# Patient Record
Sex: Male | Born: 1940 | Race: Black or African American | Hispanic: No | Marital: Married | Smoking: Never smoker
Health system: Southern US, Community
[De-identification: ages and names within clinical notes are randomized; demographics above are authoritative.]

## PROBLEM LIST (undated history)

## (undated) DIAGNOSIS — I2699 Other pulmonary embolism without acute cor pulmonale: Secondary | ICD-10-CM

## (undated) DIAGNOSIS — I82409 Acute embolism and thrombosis of unspecified deep veins of unspecified lower extremity: Secondary | ICD-10-CM

## (undated) DIAGNOSIS — G459 Transient cerebral ischemic attack, unspecified: Secondary | ICD-10-CM

## (undated) DIAGNOSIS — K219 Gastro-esophageal reflux disease without esophagitis: Secondary | ICD-10-CM

## (undated) DIAGNOSIS — M1990 Unspecified osteoarthritis, unspecified site: Secondary | ICD-10-CM

## (undated) DIAGNOSIS — R413 Other amnesia: Secondary | ICD-10-CM

## (undated) DIAGNOSIS — C801 Malignant (primary) neoplasm, unspecified: Secondary | ICD-10-CM

## (undated) HISTORY — DX: Acute embolism and thrombosis of unspecified deep veins of unspecified lower extremity: I82.409

## (undated) HISTORY — DX: Other amnesia: R41.3

## (undated) HISTORY — DX: Other pulmonary embolism without acute cor pulmonale: I26.99

---

## 2009-07-09 ENCOUNTER — Inpatient Hospital Stay: Payer: Self-pay | Admitting: Internal Medicine

## 2010-08-03 HISTORY — PX: PROSTATE SURGERY: SHX751

## 2011-05-04 DIAGNOSIS — C801 Malignant (primary) neoplasm, unspecified: Secondary | ICD-10-CM

## 2011-05-04 HISTORY — DX: Malignant (primary) neoplasm, unspecified: C80.1

## 2011-09-23 ENCOUNTER — Emergency Department: Payer: Self-pay | Admitting: Unknown Physician Specialty

## 2011-09-23 LAB — URINALYSIS, COMPLETE
Bacteria: NONE SEEN
Bilirubin,UR: NEGATIVE
Glucose,UR: NEGATIVE mg/dL (ref 0–75)
Ketone: NEGATIVE
Leukocyte Esterase: NEGATIVE
Nitrite: NEGATIVE
Protein: NEGATIVE
RBC,UR: 1 /HPF (ref 0–5)
Specific Gravity: 1.003 (ref 1.003–1.030)
Squamous Epithelial: NONE SEEN
WBC UR: NONE SEEN /HPF (ref 0–5)

## 2012-07-20 ENCOUNTER — Other Ambulatory Visit (HOSPITAL_COMMUNITY): Payer: Self-pay | Admitting: Urology

## 2012-07-20 DIAGNOSIS — C61 Malignant neoplasm of prostate: Secondary | ICD-10-CM

## 2012-08-04 ENCOUNTER — Encounter (HOSPITAL_COMMUNITY)
Admission: RE | Admit: 2012-08-04 | Discharge: 2012-08-04 | Disposition: A | Discharge status: Home / Self Care | Payer: Medicare Other | Source: Ambulatory Visit | Attending: Urology | Admitting: Urology

## 2012-08-04 DIAGNOSIS — C61 Malignant neoplasm of prostate: Secondary | ICD-10-CM | POA: Insufficient documentation

## 2012-08-04 MED ORDER — TECHNETIUM TC 99M MEDRONATE IV KIT
25.0000 | PACK | Freq: Once | INTRAVENOUS | Status: AC | PRN
  Administered 2012-08-04: 25 via INTRAVENOUS

## 2012-08-15 ENCOUNTER — Encounter (HOSPITAL_COMMUNITY): Payer: Self-pay | Admitting: Pharmacy Technician

## 2012-08-15 ENCOUNTER — Other Ambulatory Visit: Payer: Self-pay | Admitting: Urology

## 2012-08-17 NOTE — Patient Instructions (Signed)
Philip Moreno  08/17/2012   Your procedure is scheduled on:  08/25/12   Report to [facility] at 0515 AM.  Call this number if you have problems the morning of surgery: [phone]   Remember:   Do not eat food or drink liquids after midnight.   Take these medicines the morning of surgery with A SIP OF WATER:    Do not wear jewelry,   Do not wear lotions, powders, or perfumes.     Men may shave face and neck.  Do not bring valuables to the hospital.  Contacts, dentures or bridgework may not be worn into surgery.  Leave suitcase in the car. After surgery it may be brought to your room.  For patients admitted to the hospital, checkout time is 11:00 AM the day of  discharge. .  SEE CHG INSTRUCTION SHEET    Please read over the following fact sheets that you were given: MRSA Information, Incentive Spirometry Fact Sheet, Blood Transfusion fact Sheet, coughing and deep breathing exercises, leg exercises                Failure to comply with these instructions may result in cancellation of your surgery              Patient Signature ________________________________              Nurse Signature _______________________________

## 2012-08-18 ENCOUNTER — Ambulatory Visit (HOSPITAL_COMMUNITY)
Admission: RE | Admit: 2012-08-18 | Discharge: 2012-08-18 | Disposition: A | Discharge status: Home / Self Care | Payer: Medicare Other | Source: Ambulatory Visit | Attending: Urology | Admitting: Urology

## 2012-08-18 ENCOUNTER — Encounter (HOSPITAL_COMMUNITY): Payer: Self-pay

## 2012-08-18 ENCOUNTER — Encounter (HOSPITAL_COMMUNITY)
Admission: RE | Admit: 2012-08-18 | Discharge: 2012-08-18 | Disposition: A | Discharge status: Home / Self Care | Payer: Medicare Other | Source: Ambulatory Visit | Attending: Urology | Admitting: Urology

## 2012-08-18 DIAGNOSIS — Z01810 Encounter for preprocedural cardiovascular examination: Secondary | ICD-10-CM | POA: Insufficient documentation

## 2012-08-18 DIAGNOSIS — Z01812 Encounter for preprocedural laboratory examination: Secondary | ICD-10-CM | POA: Insufficient documentation

## 2012-08-18 DIAGNOSIS — C61 Malignant neoplasm of prostate: Secondary | ICD-10-CM | POA: Insufficient documentation

## 2012-08-18 DIAGNOSIS — I10 Essential (primary) hypertension: Secondary | ICD-10-CM | POA: Insufficient documentation

## 2012-08-18 HISTORY — DX: Unspecified osteoarthritis, unspecified site: M19.90

## 2012-08-18 HISTORY — DX: Gastro-esophageal reflux disease without esophagitis: K21.9

## 2012-08-18 HISTORY — DX: Malignant (primary) neoplasm, unspecified: C80.1

## 2012-08-18 LAB — BASIC METABOLIC PANEL
BUN: 13 mg/dL (ref 6–23)
CO2: 26 mEq/L (ref 19–32)
Chloride: 103 mEq/L (ref 96–112)
Creatinine, Ser: 1.23 mg/dL (ref 0.50–1.35)
GFR calc Af Amer: 67 mL/min — ABNORMAL LOW (ref >90)
Glucose, Bld: 95 mg/dL (ref 70–99)

## 2012-08-18 LAB — CBC
HCT: 45.5 % (ref 39.0–52.0)
MCH: 27.2 pg (ref 26.0–34.0)
MCHC: 33.4 g/dL (ref 30.0–36.0)
MCV: 81.4 fL (ref 78.0–100.0)
RDW: 13.1 % (ref 11.5–15.5)

## 2012-08-24 NOTE — Anesthesia Preprocedure Evaluation (Signed)
Anesthesia Evaluation  Patient identified by MRN, date of birth, ID band Patient awake    Reviewed: Allergy & Precautions, H&P , NPO status , Patient's Chart, lab work & pertinent test results, reviewed documented beta blocker date and time   Airway Mallampati: II TM Distance: >3 FB Neck ROM: full    Dental No notable dental hx.    Pulmonary neg pulmonary ROS,  breath sounds clear to auscultation  Pulmonary exam normal       Cardiovascular Exercise Tolerance: Good hypertension, On Medications Rhythm:regular Rate:Normal     Neuro/Psych negative neurological ROS  negative psych ROS   GI/Hepatic negative GI ROS, Neg liver ROS,   Endo/Other  negative endocrine ROS  Renal/GU negative Renal ROS  negative genitourinary   Musculoskeletal   Abdominal   Peds  Hematology negative hematology ROS (+)   Anesthesia Other Findings   Reproductive/Obstetrics negative OB ROS                           Anesthesia Physical Anesthesia Plan  ASA: II  Anesthesia Plan: General ETT   Post-op Pain Management:    Induction:   Airway Management Planned:   Additional Equipment:   Intra-op Plan:   Post-operative Plan:   Informed Consent: I have reviewed the patients History and Physical, chart, labs and discussed the procedure including the risks, benefits and alternatives for the proposed anesthesia with the patient or authorized representative who has indicated his/her understanding and acceptance.   Dental Advisory Given  Plan Discussed with: CRNA  Anesthesia Plan Comments:         Anesthesia Quick Evaluation

## 2012-08-24 NOTE — H&P (Signed)
Chief Complaint  Prostate Cancer   Reason For Visit  Reason for consult: To discuss treatment options for prostate cancer and specifically to consider a robotic prostatectomy. Physician requesting consult: Dr. Jerilee Field PCP: Dr. 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 Mena Goes to perform a prostate biopsy on 07/13/12.  This demonstrated Gleason 4+3=7 adenocarcinoma with 5 out of 12 biopsy cores positive for malignancy. He underwent staging studies on 08/04/12 including a bone scan and CT scan of the abdomen and pelvis both of which were negative for obvious metastatic disease. He has a family history of prostate cancer with a brother who died of metastatic prostate cancer. Philip Moreno is overall very healthy with minimal medical comorbid conditions.  TNM stage: cT2b vs. cT3a N0 M0 PSA: 12.91 Gleason score: 4+3=7 Biopsy (07/13/12): 5 out of 12 biopsy cores positive   Left: L lateral apex (50%, 3+4=7), L apex (20%, 3+3=6), L lateral mid (10%, 3+3=6)   Right: R apex (< 5%, 3+4=7), R lateral apex (80%, 4+3=7) Prostate volume: 36 cc  Nomogram: OC disease: 47% EPE: 56% SVI: 22% LNI: 3.9% PFS (surgery): 83%, 75%  Urinary function: He has moderate LUTS including frequency, urgency, nocturia, and a weak stream. IPSS is 9. Erectile function: He denies erectile dysfunction at baseline although says he has been having difficulty since his prostate biopsy to some degree. SHIM score is 25.   Past Medical History Problems  1. History of  Hypertension 401.9  Surgical History Problems  1. History of  No Surgical Problems  Current Meds 1. Diltiazem HCl ER Beads 300 MG Oral Capsule Extended Release 24 Hour; Therapy:  (Recorded:14Nov2013) to  Allergies Medication  1. No Known Drug Allergies  Family History Problems  1. Paternal history of  Acute  Myocardial Infarction V17.3 2. Family history of  Death In The Family Father 3. Family history of  Diabetes Mellitus V18.0 4. Family history of  Family Health Status Of Mother - Alive 5. Fraternal history of  Prostate Cancer V16.42  Social History Problems    Chewing Nicotine-containing Substances Tobacco   Marital History - Currently Married   Never A Smoker   Occupation: Visual merchandiser Denied    History of  Alcohol Use   History of  Caffeine Use  Review of Systems Constitutional, skin, eye, otolaryngeal, hematologic/lymphatic, cardiovascular, pulmonary, endocrine, musculoskeletal, gastrointestinal, neurological and psychiatric system(s) were reviewed and pertinent findings if present are noted.    Vitals Vital Signs [Data Includes: Last 1 Day]  17Jan2014 11:23AM  Blood Pressure: 126 / 77 Heart Rate: 82  Physical Exam Constitutional: Well nourished and well developed . No acute distress.  ENT:. The ears and nose are normal in appearance.  Neck: The appearance of the neck is normal and no neck mass is present.  Pulmonary: No respiratory distress, normal respiratory rhythm and effort and clear bilateral breath sounds.  Cardiovascular: Heart rate and rhythm are normal . No peripheral edema.  Abdomen: The abdomen is soft and nontender. No masses are palpated. No CVA tenderness. No hernias are palpable. No hepatosplenomegaly noted.  Rectal: Rectal exam demonstrates normal sphincter tone, no tenderness and no masses. Prostate size is estimated to be 45 g. He has a large nodule extending from the right apex to the right base with some concern for possible extraprostatic extension. There are no abnormalities noted on  the left side. The prostate is not tender. The left seminal vesicle is nonpalpable. The right seminal vesicle is nonpalpable. The perineum is normal on inspection.  Lymphatics: The femoral and inguinal nodes are not enlarged or tender.  Skin: Normal skin turgor, no visible rash  and no visible skin lesions.  Neuro/Psych:. Mood and affect are appropriate.    Results/Data Urine [Data Includes: Last 1 Day]   17Jan2014  COLOR YELLOW   APPEARANCE CLEAR   SPECIFIC GRAVITY 1.020   pH 6.0   GLUCOSE NEG mg/dL  BILIRUBIN NEG   KETONE NEG mg/dL  BLOOD TRACE   PROTEIN NEG mg/dL  UROBILINOGEN 0.2 mg/dL  NITRITE NEG   LEUKOCYTE ESTERASE NEG   SQUAMOUS EPITHELIAL/HPF NONE SEEN   WBC NONE SEEN WBC/hpf  RBC 0-2 RBC/hpf  BACTERIA NONE SEEN   CRYSTALS NONE SEEN   CASTS NONE SEEN     I independently reviewed his medical records, PSA results, pathology report, CT scan and bone scan.  Findings are as dictated above.   Plan  Health Maintenance (V70.0)  1. UA With REFLEX  Done: 17Jan2014 11:11AM Prostate Cancer (185)  2. Follow-up Office  Follow-up  Requested for: 17Jan2014  Prostate Cancer 185    Discussion/Summary  1. Prostate cancer:   The patient was counseled about the natural history of prostate cancer and the standard treatment options that are available for prostate cancer. It was explained to him how his age and life expectancy, clinical stage, Gleason score, and PSA affect his prognosis, the decision to proceed with additional staging studies, as well as how that information influences recommended treatment strategies. We discussed the roles for active surveillance, radiation therapy, surgical therapy, androgen deprivation, as well as ablative therapy options for the treatment of prostate cancer as appropriate to his individual cancer situation. We discussed the risks and benefits of these options with regard to their impact on cancer control and also in terms of potential adverse events, complications, and impact on quiality of life particularly related to urinary, bowel, and sexual function. The patient was encouraged to ask questions throughout the discussion today and all questions were answered to his stated satisfaction. In addition, the patient was provided  with and/or directed to appropriate resources and literature for further education about prostate cancer and treatment options.   We discussed surgical therapy for prostate cancer including the different available surgical approaches. We discussed, in detail, the risks and expectations of surgery with regard to cancer control, urinary control, and erectile function as well as the expected postoperative recovery process. Additional risks of surgery including but not limited to bleeding, infection, hernia formation, nerve damage, lymphocele formation, bowel/rectal injury potentially necessitating colostomy, damage to the urinary tract resulting in urine leakage, urethral stricture, and the cardiopulmonary risks such as myocardial infarction, stroke, death, venothromboembolism, etc. were explained. The risk of open surgical conversion for robotic/laparoscopic prostatectomy was also discussed.   He does wish to proceed with surgical therapy. Considering his digital rectal exam and the concern for possibly locally advanced disease the right side, I recommended a left nerve sparing robotic-assisted laparoscopic radical prostatectomy and bilateral pelvic lymphadenectomy. We specifically discussed the implications of this surgical approach without relates to his postoperative erectile function. He understands that he has an approximately 50% chance of regaining erectile function postoperatively and accepts this risk.  CC: Dr. Jerilee Field Dr. Tim Lair    SignaturesElectronically signed by : Heloise Purpura, M.D.; Aug 19 2012  3:12PM

## 2012-08-25 ENCOUNTER — Encounter (HOSPITAL_COMMUNITY)
Admission: RE | Disposition: A | Discharge status: Home / Self Care | Payer: Self-pay | Source: Ambulatory Visit | Attending: Urology

## 2012-08-25 ENCOUNTER — Inpatient Hospital Stay (HOSPITAL_COMMUNITY): Payer: Medicare Other | Admitting: Anesthesiology

## 2012-08-25 ENCOUNTER — Observation Stay (HOSPITAL_COMMUNITY)
Admission: RE | Admit: 2012-08-25 | Discharge: 2012-08-26 | Disposition: A | Discharge status: Home / Self Care | Payer: Medicare Other | Source: Ambulatory Visit | Attending: Urology | Admitting: Urology

## 2012-08-25 ENCOUNTER — Encounter (HOSPITAL_COMMUNITY): Payer: Self-pay | Admitting: *Deleted

## 2012-08-25 ENCOUNTER — Encounter (HOSPITAL_COMMUNITY): Payer: Self-pay | Admitting: Anesthesiology

## 2012-08-25 DIAGNOSIS — I10 Essential (primary) hypertension: Secondary | ICD-10-CM | POA: Insufficient documentation

## 2012-08-25 DIAGNOSIS — C61 Malignant neoplasm of prostate: Principal | ICD-10-CM | POA: Insufficient documentation

## 2012-08-25 HISTORY — PX: LYMPHADENECTOMY: SHX5960

## 2012-08-25 HISTORY — PX: ROBOT ASSISTED LAPAROSCOPIC RADICAL PROSTATECTOMY: SHX5141

## 2012-08-25 LAB — HEMOGLOBIN AND HEMATOCRIT, BLOOD
HCT: 43.4 % (ref 39.0–52.0)
Hemoglobin: 14.3 g/dL (ref 13.0–17.0)

## 2012-08-25 LAB — ABO/RH: ABO/RH(D): O POS

## 2012-08-25 LAB — TYPE AND SCREEN

## 2012-08-25 SURGERY — ROBOTIC ASSISTED LAPAROSCOPIC RADICAL PROSTATECTOMY LEVEL 2
Anesthesia: General | Wound class: Clean

## 2012-08-25 MED ORDER — KCL IN DEXTROSE-NACL 20-5-0.45 MEQ/L-%-% IV SOLN
INTRAVENOUS | Status: DC
  Administered 2012-08-25 – 2012-08-26 (×3): via INTRAVENOUS
  Filled 2012-08-25 (×4): qty 1000

## 2012-08-25 MED ORDER — SODIUM CHLORIDE 0.9 % IR SOLN
Status: DC | PRN
  Administered 2012-08-25: 1000 mL

## 2012-08-25 MED ORDER — LACTATED RINGERS IV SOLN: INTRAVENOUS | Status: DC

## 2012-08-25 MED ORDER — ACETAMINOPHEN 325 MG PO TABS: 650.0000 mg | ORAL_TABLET | ORAL | Status: DC | PRN

## 2012-08-25 MED ORDER — BUPIVACAINE-EPINEPHRINE 0.25% -1:200000 IJ SOLN
INTRAMUSCULAR | Status: DC | PRN
  Administered 2012-08-25: 28 mL

## 2012-08-25 MED ORDER — CEFAZOLIN SODIUM 1-5 GM-% IV SOLN
1.0000 g | Freq: Three times a day (TID) | INTRAVENOUS | Status: AC
  Administered 2012-08-25 (×2): 1 g via INTRAVENOUS
  Filled 2012-08-25 (×2): qty 50

## 2012-08-25 MED ORDER — CIPROFLOXACIN HCL 500 MG PO TABS: 500.0000 mg | ORAL_TABLET | Freq: Two times a day (BID) | ORAL | Status: DC

## 2012-08-25 MED ORDER — ONDANSETRON HCL 4 MG/2ML IJ SOLN
INTRAMUSCULAR | Status: DC | PRN
  Administered 2012-08-25: 4 mg via INTRAVENOUS

## 2012-08-25 MED ORDER — LACTATED RINGERS IV SOLN
INTRAVENOUS | Status: DC | PRN
  Administered 2012-08-25 (×2): via INTRAVENOUS

## 2012-08-25 MED ORDER — LIDOCAINE HCL (CARDIAC) 20 MG/ML IV SOLN
INTRAVENOUS | Status: DC | PRN
  Administered 2012-08-25: 50 mg via INTRAVENOUS

## 2012-08-25 MED ORDER — INDIGOTINDISULFONATE SODIUM 8 MG/ML IJ SOLN
INTRAMUSCULAR | Status: DC | PRN
  Administered 2012-08-25 (×2): 5 mL via INTRAVENOUS

## 2012-08-25 MED ORDER — HYDROMORPHONE HCL PF 1 MG/ML IJ SOLN
0.2500 mg | INTRAMUSCULAR | Status: DC | PRN
  Administered 2012-08-25 (×2): 0.5 mg via INTRAVENOUS

## 2012-08-25 MED ORDER — HYDROMORPHONE HCL PF 1 MG/ML IJ SOLN
INTRAMUSCULAR | Status: DC | PRN
  Administered 2012-08-25 (×2): 0.5 mg via INTRAVENOUS
  Administered 2012-08-25: 1 mg via INTRAVENOUS

## 2012-08-25 MED ORDER — GLYCOPYRROLATE 0.2 MG/ML IJ SOLN
INTRAMUSCULAR | Status: DC | PRN
  Administered 2012-08-25: .7 mg via INTRAVENOUS

## 2012-08-25 MED ORDER — PROMETHAZINE HCL 25 MG/ML IJ SOLN: 6.2500 mg | INTRAMUSCULAR | Status: DC | PRN

## 2012-08-25 MED ORDER — DIPHENHYDRAMINE HCL 12.5 MG/5ML PO ELIX: 12.5000 mg | ORAL_SOLUTION | Freq: Four times a day (QID) | ORAL | Status: DC | PRN

## 2012-08-25 MED ORDER — DOCUSATE SODIUM 100 MG PO CAPS
100.0000 mg | ORAL_CAPSULE | Freq: Two times a day (BID) | ORAL | Status: DC
  Administered 2012-08-25 – 2012-08-26 (×3): 100 mg via ORAL
  Filled 2012-08-25 (×4): qty 1

## 2012-08-25 MED ORDER — PROPOFOL 10 MG/ML IV BOLUS
INTRAVENOUS | Status: DC | PRN
  Administered 2012-08-25: 200 mg via INTRAVENOUS

## 2012-08-25 MED ORDER — MORPHINE SULFATE 2 MG/ML IJ SOLN: 2.0000 mg | INTRAMUSCULAR | Status: DC | PRN

## 2012-08-25 MED ORDER — FENTANYL CITRATE 0.05 MG/ML IJ SOLN
INTRAMUSCULAR | Status: DC | PRN
  Administered 2012-08-25 (×2): 50 ug via INTRAVENOUS
  Administered 2012-08-25: 100 ug via INTRAVENOUS
  Administered 2012-08-25: 50 ug via INTRAVENOUS

## 2012-08-25 MED ORDER — DIPHENHYDRAMINE HCL 50 MG/ML IJ SOLN: 12.5000 mg | Freq: Four times a day (QID) | INTRAMUSCULAR | Status: DC | PRN

## 2012-08-25 MED ORDER — STERILE WATER FOR IRRIGATION IR SOLN
Status: DC | PRN
  Administered 2012-08-25: 3000 mL

## 2012-08-25 MED ORDER — KETOROLAC TROMETHAMINE 15 MG/ML IJ SOLN
15.0000 mg | Freq: Four times a day (QID) | INTRAMUSCULAR | Status: DC
  Administered 2012-08-25 – 2012-08-26 (×4): 15 mg via INTRAVENOUS
  Filled 2012-08-25 (×6): qty 1

## 2012-08-25 MED ORDER — CEFAZOLIN SODIUM-DEXTROSE 2-3 GM-% IV SOLR
2.0000 g | INTRAVENOUS | Status: AC
  Administered 2012-08-25: 2 g via INTRAVENOUS

## 2012-08-25 MED ORDER — NEOSTIGMINE METHYLSULFATE 1 MG/ML IJ SOLN
INTRAMUSCULAR | Status: DC | PRN
  Administered 2012-08-25: 4 mg via INTRAVENOUS

## 2012-08-25 MED ORDER — DILTIAZEM HCL ER BEADS 300 MG PO CP24
300.0000 mg | ORAL_CAPSULE | Freq: Every morning | ORAL | Status: DC
  Administered 2012-08-26: 300 mg via ORAL
  Filled 2012-08-25: qty 1

## 2012-08-25 MED ORDER — LACTATED RINGERS IV SOLN
INTRAVENOUS | Status: DC | PRN
  Administered 2012-08-25: 08:00:00

## 2012-08-25 MED ORDER — ROCURONIUM BROMIDE 100 MG/10ML IV SOLN
INTRAVENOUS | Status: DC | PRN
  Administered 2012-08-25: 20 mg via INTRAVENOUS
  Administered 2012-08-25: 50 mg via INTRAVENOUS
  Administered 2012-08-25: 20 mg via INTRAVENOUS

## 2012-08-25 MED ORDER — HYDROCODONE-ACETAMINOPHEN 5-325 MG PO TABS: 1.0000 | ORAL_TABLET | Freq: Four times a day (QID) | ORAL | Status: DC | PRN

## 2012-08-25 MED ORDER — SODIUM CHLORIDE 0.9 % IV BOLUS (SEPSIS)
1000.0000 mL | Freq: Once | INTRAVENOUS | Status: AC
  Administered 2012-08-25: 1000 mL via INTRAVENOUS

## 2012-08-25 SURGICAL SUPPLY — 46 items
CANISTER SUCTION 2500CC (MISCELLANEOUS) ×3 IMPLANT
CATH FOLEY 2WAY SLVR 18FR 30CC (CATHETERS) ×3 IMPLANT
CATH ROBINSON RED A/P 16FR (CATHETERS) ×3 IMPLANT
CATH ROBINSON RED A/P 8FR (CATHETERS) ×3 IMPLANT
CATH TIEMANN FOLEY 18FR 5CC (CATHETERS) IMPLANT
CHLORAPREP W/TINT 26ML (MISCELLANEOUS) ×3 IMPLANT
CLIP LIGATING HEM O LOK PURPLE (MISCELLANEOUS) ×6 IMPLANT
CLOTH BEACON ORANGE TIMEOUT ST (SAFETY) ×3 IMPLANT
CORD HIGH FREQUENCY UNIPOLAR (ELECTROSURGICAL) ×3 IMPLANT
COVER SURGICAL LIGHT HANDLE (MISCELLANEOUS) ×3 IMPLANT
COVER TIP SHEARS 8 DVNC (MISCELLANEOUS) ×2 IMPLANT
COVER TIP SHEARS 8MM DA VINCI (MISCELLANEOUS) ×1
CUTTER ECHEON FLEX ENDO 45 340 (ENDOMECHANICALS) ×3 IMPLANT
DECANTER SPIKE VIAL GLASS SM (MISCELLANEOUS) IMPLANT
DRAPE SURG IRRIG POUCH 19X23 (DRAPES) ×3 IMPLANT
DRSG TEGADERM 2-3/8X2-3/4 SM (GAUZE/BANDAGES/DRESSINGS) ×15 IMPLANT
DRSG TEGADERM 4X4.75 (GAUZE/BANDAGES/DRESSINGS) ×3 IMPLANT
DRSG TEGADERM 6X8 (GAUZE/BANDAGES/DRESSINGS) ×6 IMPLANT
ELECT REM PT RETURN 9FT ADLT (ELECTROSURGICAL) ×3
ELECTRODE REM PT RTRN 9FT ADLT (ELECTROSURGICAL) ×2 IMPLANT
GAUZE SPONGE 2X2 8PLY STRL LF (GAUZE/BANDAGES/DRESSINGS) ×2 IMPLANT
GLOVE BIO SURGEON STRL SZ 6.5 (GLOVE) ×3 IMPLANT
GLOVE BIOGEL M STRL SZ7.5 (GLOVE) ×6 IMPLANT
GOWN STRL NON-REIN LRG LVL3 (GOWN DISPOSABLE) ×9 IMPLANT
GOWN STRL REIN XL XLG (GOWN DISPOSABLE) ×6 IMPLANT
HOLDER FOLEY CATH W/STRAP (MISCELLANEOUS) ×3 IMPLANT
IV LACTATED RINGERS 1000ML (IV SOLUTION) ×3 IMPLANT
KIT ACCESSORY DA VINCI DISP (KITS) ×1
KIT ACCESSORY DVNC DISP (KITS) ×2 IMPLANT
NDL SAFETY ECLIPSE 18X1.5 (NEEDLE) ×2 IMPLANT
NEEDLE HYPO 18GX1.5 SHARP (NEEDLE) ×1
PACK ROBOT UROLOGY CUSTOM (CUSTOM PROCEDURE TRAY) ×3 IMPLANT
RELOAD GREEN ECHELON 45 (STAPLE) ×3 IMPLANT
SET TUBE IRRIG SUCTION NO TIP (IRRIGATION / IRRIGATOR) ×3 IMPLANT
SOLUTION ELECTROLUBE (MISCELLANEOUS) ×3 IMPLANT
SPONGE GAUZE 2X2 STER 10/PKG (GAUZE/BANDAGES/DRESSINGS) ×1
SUT ETHILON 3 0 PS 1 (SUTURE) ×3 IMPLANT
SUT MNCRL 3 0 VIOLET RB1 (SUTURE) ×4 IMPLANT
SUT MONOCRYL 3 0 RB1 (SUTURE) ×2
SUT VIC AB 0 CT1 27 (SUTURE) ×1
SUT VIC AB 0 CT1 27XBRD ANTBC (SUTURE) ×2 IMPLANT
SUT VICRYL 0 UR6 27IN ABS (SUTURE) ×6 IMPLANT
SYR 27GX1/2 1ML LL SAFETY (SYRINGE) ×3 IMPLANT
TOWEL OR 17X26 10 PK STRL BLUE (TOWEL DISPOSABLE) ×3 IMPLANT
TOWEL OR NON WOVEN STRL DISP B (DISPOSABLE) ×3 IMPLANT
WATER STERILE IRR 1500ML POUR (IV SOLUTION) ×6 IMPLANT

## 2012-08-25 NOTE — Progress Notes (Signed)
Patient ID: Philip Moreno, male   DOB: July 01, 1941, 72 y.o.   MRN: [id number] Post-op note  Subjective: The patient is doing well.  No complaints.  Objective: Vital signs in last 24 hours: Temp:  [97.3 F (36.3 C)-98.6 F (37 C)] 97.3 F (36.3 C) (01/23 1353) Pulse Rate:  [66-82] 74  (01/23 1353) Resp:  [13-22] 16  (01/23 1353) BP: (124-165)/(79-109) 141/80 mmHg (01/23 1353) SpO2:  [95 %-100 %] 97 % (01/23 1353)  Intake/Output from previous day:   Intake/Output this shift: Total I/O In: 3000 [I.V.:2000; IV Piggyback:1000] Out: 2100 [Urine:1950; Drains:50; Blood:100]  Physical Exam:  General: Alert and oriented. Abdomen: Soft, Nondistended. Incisions: Clean and dry.  Lab Results:  Basename 08/25/12 1038  HGB 14.3  HCT 43.4    Assessment/Plan: POD#0   Doing well 1) Continue to monitor 2.) I.S 3.) Amb 4.)DVT prophy     LOS: 0 days   Silas Flood. 08/25/2012, 2:40 PM

## 2012-08-25 NOTE — Op Note (Signed)
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 Robotic assisted laparoscopic radical prostatectomy (left nerve sparing) 2. Bilateral robotic assisted laparoscopic pelvic lymphadenectomy  Surgeon: Moody Bruins. M.D.  Assistant(s):  Pecola Leisure, PA-C  Anesthesia: General  Complications: None  EBL: 100 mL  IVF:  1200 mL crystalloid  Specimens: 1. Prostate and seminal vesicles 2. Right pelvic lymph nodes 3. Left pelvic lymph nodes  Disposition of specimens: Pathology  Drains: 1. 20 Fr coude catheter 2. # 19 Blake pelvic drain  Indication: Philip Moreno is a 72 y.o. patient with locally advanced prostate cancer.  After a thorough review of the management options for treatment of prostate cancer, he elected to proceed with surgical therapy and the above procedure(s).  We have discussed the potential benefits and risks of the procedure, side effects of the proposed treatment, the likelihood of the patient achieving the goals of the procedure, and any potential problems that might occur during the procedure or recuperation. Informed consent has been obtained.  Description of procedure:  The patient was taken to the operating room and a general anesthetic was administered. He was given preoperative antibiotics, placed in the dorsal lithotomy position, and prepped and draped in the usual sterile fashion. Next a preoperative timeout was performed. A urethral catheter was placed into the bladder and a site was selected near the umbilicus for placement of the camera port. This was placed using a standard open Hassan technique which allowed entry into the peritoneal cavity under direct vision and without difficulty. A 12 mm port was placed and a pneumoperitoneum established. The camera was then used to inspect  the abdomen and there was no evidence of any intra-abdominal injuries or other abnormalities. The remaining abdominal ports were then placed. 8 mm robotic ports were placed in the right lower quadrant, left lower quadrant, and far left lateral abdominal wall. A 5 mm port was placed in the right upper quadrant and a 12 mm port was placed in the right lateral abdominal wall for laparoscopic assistance. All ports were placed under direct vision without difficulty. The surgical cart was then docked.   Utilizing the cautery scissors, the bladder was reflected posteriorly allowing entry into the space of Retzius and identification of the endopelvic fascia and prostate. The periprostatic fat was then removed from the prostate allowing full exposure of the endopelvic fascia. The endopelvic fascia was then incised from the apex back to the base of the prostate bilaterally and the underlying levator muscle fibers were swept laterally off the prostate thereby isolating the dorsal venous complex. The dorsal vein was then stapled and divided with a 45 mm Flex Echelon stapler. Attention then turned to the bladder neck which was divided anteriorly thereby allowing entry into the bladder and exposure of the urethral catheter. The catheter balloon was deflated and the catheter was brought into the operative field and used to retract the prostate anteriorly. The posterior bladder neck was then examined and was divided allowing further dissection between the bladder and prostate posteriorly until the vasa deferentia and seminal vessels were identified. The vasa deferentia were isolated, divided, and lifted anteriorly. The seminal vesicles were dissected down to their tips with care to control the seminal vascular arterial blood supply. These structures were then lifted anteriorly and the space between Denonvillier's fascia and the anterior rectum was developed with a combination of sharp  and blunt dissection. This isolated the  vascular pedicles of the prostate.  The lateral prostatic fascia on the left side of the prostate was then sharply incised allowing release of the neurovascular bundle. The vascular pedicle of the prostate on the left side was then ligated with Weck clips between the prostate and neurovascular bundle and divided with sharp cold scissor dissection resulting in neurovascular bundle preservation. On the right side, a wide non nerve sparing dissection was performed with Weck clips used to ligate the vascular pedicle of the prostate. The neurovascular bundle on the left side was then separated off the apex of the prostate and urethra.   The urethra was then sharply transected allowing the prostate specimen to be disarticulated. The pelvis was copiously irrigated and hemostasis was ensured. There was no evidence for rectal injury.  Attention then turned to the right pelvic sidewall. The fibrofatty tissue between the external iliac vein, confluence of the iliac vessels, hypogastric artery, and Cooper's ligament was dissected free from the pelvic sidewall with care to preserve the obturator nerve. Weck clips were used for lymphostasis and hemostasis. An identical procedure was performed on the contralateral side and the lymphatic packets were removed for permanent pathologic analysis.  Attention then turned to the urethral anastomosis. A 2-0 Vicryl slip knot was placed between Denonvillier's fascia, the posterior bladder neck, and the posterior urethra to reapproximate these structures. A double-armed 3-0 Monocryl suture was then used to perform a 360 running tension-free anastomosis between the bladder neck and urethra. A new urethral catheter was then placed into the bladder and irrigated. There were no blood clots within the bladder and the anastomosis appeared to be watertight. A #19 Blake drain was then brought through the left lateral 8 mm port site and positioned appropriately within the pelvis. It was  secured to the skin with a nylon suture. The surgical cart was then undocked. The right lateral 12 mm port site was closed at the fascial level with a 0 Vicryl suture placed laparoscopically. All remaining ports were then removed under direct vision. The prostate specimen was removed intact within the Endopouch retrieval bag via the periumbilical camera port site. This fascial opening was closed with two running 0 Vicryl sutures. 0.25% Marcaine was then injected into all port sites and all incisions were reapproximated at the skin level with staples. Sterile dressings were applied. The patient appeared to tolerate the procedure well and without complications. The patient was able to be extubated and transferred to the recovery unit in satisfactory condition.   Moody Bruins MD

## 2012-08-25 NOTE — Interval H&P Note (Signed)
History and Physical Interval Note:  08/25/2012 7:10 AM  Philip Moreno  has presented today for surgery, with the diagnosis of PROSTATE CANCER  The various methods of treatment have been discussed with the patient and family. After consideration of risks, benefits and other options for treatment, the patient has consented to  Procedure(s) (LRB) with comments: ROBOTIC ASSISTED LAPAROSCOPIC RADICAL PROSTATECTOMY LEVEL 2 (N/A) LYMPHADENECTOMY (Bilateral) as a surgical intervention .  The patient's history has been reviewed, patient examined, no change in status, stable for surgery.  I have reviewed the patient's chart and labs.  Questions were answered to the patient's satisfaction.     Camyah Pultz,LES

## 2012-08-25 NOTE — Anesthesia Postprocedure Evaluation (Signed)
  Anesthesia Post-op Note  Patient: Philip Moreno  Procedure(s) Performed: Procedure(s) (LRB): ROBOTIC ASSISTED LAPAROSCOPIC RADICAL PROSTATECTOMY LEVEL 2 (N/A) LYMPHADENECTOMY (Bilateral)  Patient Location: PACU  Anesthesia Type: General  Level of Consciousness: awake and alert   Airway and Oxygen Therapy: Patient Spontanous Breathing  Post-op Pain: mild  Post-op Assessment: Post-op Vital signs reviewed, Patient's Cardiovascular Status Stable, Respiratory Function Stable, Patent Airway and No signs of Nausea or vomiting  Last Vitals:  Filed Vitals:   08/25/12 1115  BP: 134/83  Pulse: 73  Temp:   Resp: 15    Post-op Vital Signs: stable   Complications: No apparent anesthesia complications

## 2012-08-25 NOTE — Transfer of Care (Signed)
Immediate Anesthesia Transfer of Care Note  Patient: Philip Moreno  Procedure(s) Performed: Procedure(s) (LRB): ROBOTIC ASSISTED LAPAROSCOPIC RADICAL PROSTATECTOMY LEVEL 2 (N/A) LYMPHADENECTOMY (Bilateral)  Patient Location: PACU  Anesthesia Type: General  Level of Consciousness: sedated, patient cooperative and responds to stimulaton  Airway & Oxygen Therapy: Patient Spontanous Breathing and Patient connected to face mask oxgen  Post-op Assessment: Report given to PACU RN and Post -op Vital signs reviewed and stable  Post vital signs: Reviewed and stable  Complications: No apparent anesthesia complications

## 2012-08-26 ENCOUNTER — Encounter (HOSPITAL_COMMUNITY): Payer: Self-pay | Admitting: Urology

## 2012-08-26 LAB — HEMOGLOBIN AND HEMATOCRIT, BLOOD
HCT: 39.9 % (ref 39.0–52.0)
Hemoglobin: 13.2 g/dL (ref 13.0–17.0)

## 2012-08-26 MED ORDER — HYDROCODONE-ACETAMINOPHEN 5-325 MG PO TABS: 1.0000 | ORAL_TABLET | Freq: Four times a day (QID) | ORAL | Status: DC | PRN

## 2012-08-26 MED ORDER — BISACODYL 10 MG RE SUPP
10.0000 mg | Freq: Once | RECTAL | Status: AC
  Administered 2012-08-26: 10 mg via RECTAL
  Filled 2012-08-26: qty 1

## 2012-08-26 NOTE — Care Management Note (Signed)
    Page 1 of 1   08/26/2012     3:46:49 PM   CARE MANAGEMENT NOTE 08/26/2012  Patient:  Philip Moreno, Philip Moreno   Account Number:  [id number]  Date Initiated:  08/26/2012  Documentation initiated by:  Lanier Clam  Subjective/Objective Assessment:   ADMITTED W/PROSTATE CA.     Action/Plan:   FROM HOME   Anticipated DC Date:  08/26/2012   Anticipated DC Plan:  HOME/SELF CARE      DC Planning Services  CM consult      Choice offered to / List presented to:             Status of service:  Completed, signed off Medicare Important Message given?   (If response is "NO", the following Medicare IM given date fields will be blank) Date Medicare IM given:   Date Additional Medicare IM given:    Discharge Disposition:  HOME/SELF CARE  Per UR Regulation:  Reviewed for med. necessity/level of care/duration of stay  If discussed at Long Length of Stay Meetings, dates discussed:    Comments:  08/26/12 Camary Sosa RN,BSN NCM [phone] S/P PROSTATECTOMY.

## 2012-08-26 NOTE — Discharge Summary (Signed)
  Date of admission: 08/25/2012  Date of discharge: 08/26/2012  Admission diagnosis: Prostate Cancer  Discharge diagnosis: Prostate Cancer  History and Physical: For full details, please see admission history and physical. Briefly, Philip Moreno is a 72 y.o. gentleman with localized prostate cancer.  After discussing management/treatment options, he elected to proceed with surgical treatment.  Hospital Course: Philip Moreno was taken to the operating room on 08/25/2012 and underwent a robotic assisted laparoscopic radical prostatectomy. He tolerated this procedure well and without complications. Postoperatively, he was able to be transferred to a regular hospital room following recovery from anesthesia.  He was able to begin ambulating the night of surgery. He remained hemodynamically stable overnight.  He had excellent urine output with appropriately minimal output from his pelvic drain and his pelvic drain was removed on POD #1.  He was transitioned to oral pain medication, tolerated a clear liquid diet, and had met all discharge criteria and was able to be discharged home later on POD#1.  Laboratory values:  Basename 08/26/12 0512 08/25/12 1038  HGB 13.2 14.3  HCT 39.9 43.4    Disposition: Home  Discharge instruction: He was instructed to be ambulatory but to refrain from heavy lifting, strenuous activity, or driving. He was instructed on urethral catheter care.  Discharge medications:     Medication List     As of 08/26/2012 11:36 AM    START taking these medications         ciprofloxacin 500 MG tablet   Commonly known as: CIPRO   Take 1 tablet (500 mg total) by mouth 2 (two) times daily. Start day prior to office visit for foley removal      HYDROcodone-acetaminophen 5-325 MG per tablet   Commonly known as: NORCO/VICODIN   Take 1-2 tablets by mouth every 6 (six) hours as needed for pain.      CONTINUE taking these medications         diltiazem 300 MG 24 hr capsule   Commonly  known as: TIAZAC          Where to get your medications    These are the prescriptions that you need to pick up.   You may get these medications from any pharmacy.         ciprofloxacin 500 MG tablet   HYDROcodone-acetaminophen 5-325 MG per tablet            Followup: He will followup in 1 week for catheter removal and to discuss his surgical pathology results.

## 2012-08-26 NOTE — Progress Notes (Signed)
Patient ID: Philip Moreno, male   DOB: 13-Feb-1941, 72 y.o.   MRN: [id number]  1 Day Post-Op Subjective: The patient is doing well.  No nausea or vomiting. Pain is adequately controlled.  Objective: Vital signs in last 24 hours: Temp:  [97.3 F (36.3 C)-99.6 F (37.6 C)] 99.6 F (37.6 C) (01/24 0426) Pulse Rate:  [66-78] 66  (01/24 0426) Resp:  [13-22] 18  (01/24 0426) BP: (124-145)/(79-94) 142/81 mmHg (01/24 0426) SpO2:  [93 %-99 %] 99 % (01/24 0426) Weight:  [93.441 kg (206 lb)] 93.441 kg (206 lb) (01/23 1353)  Intake/Output from previous day: 01/23 0701 - 01/24 0700 In: 6277.5 [P.O.:680; I.V.:4597.5; IV Piggyback:1000] Out: 4255 [Urine:3950; Drains:205; Blood:100] Intake/Output this shift:    Physical Exam:  General: Alert and oriented. CV: RRR Lungs: Clear bilaterally. GI: Soft, Nondistended. Incisions: Dressings intact. Urine: Clear Extremities: Nontender, no erythema, no edema.  Lab Results:  Basename 08/26/12 0512 08/25/12 1038  HGB 13.2 14.3  HCT 39.9 43.4      Assessment/Plan: POD# 1 s/p robotic prostatectomy.  1) SL IVF 2) Ambulate, Incentive spirometry 3) Transition to oral pain medication 4) Dulcolax suppository 5) D/C pelvic drain 6) Plan for likely discharge later today   Philip Moreno. MD   LOS: 1 day   Philip Moreno,LES 08/26/2012, 7:19 AM

## 2013-12-13 ENCOUNTER — Ambulatory Visit: Payer: Self-pay | Admitting: Internal Medicine

## 2014-02-28 DIAGNOSIS — F4489 Other dissociative and conversion disorders: Secondary | ICD-10-CM | POA: Insufficient documentation

## 2014-03-16 ENCOUNTER — Ambulatory Visit: Payer: Self-pay | Admitting: Neurology

## 2014-04-20 ENCOUNTER — Encounter: Payer: Self-pay | Admitting: *Deleted

## 2014-04-20 ENCOUNTER — Encounter: Payer: Self-pay | Admitting: Neurology

## 2014-04-23 ENCOUNTER — Encounter: Payer: Self-pay | Admitting: Neurology

## 2014-04-23 ENCOUNTER — Encounter (INDEPENDENT_AMBULATORY_CARE_PROVIDER_SITE_OTHER): Payer: Self-pay

## 2014-04-23 ENCOUNTER — Ambulatory Visit (INDEPENDENT_AMBULATORY_CARE_PROVIDER_SITE_OTHER): Payer: Medicare Other | Admitting: Neurology

## 2014-04-23 VITALS — BP 107/68 | HR 74 | Ht 73.5 in | Wt 207.0 lb

## 2014-04-23 DIAGNOSIS — F09 Unspecified mental disorder due to known physiological condition: Secondary | ICD-10-CM

## 2014-04-23 DIAGNOSIS — R42 Dizziness and giddiness: Secondary | ICD-10-CM

## 2014-04-23 DIAGNOSIS — R51 Headache: Secondary | ICD-10-CM

## 2014-04-23 DIAGNOSIS — F03918 Unspecified dementia, unspecified severity, with other behavioral disturbance: Secondary | ICD-10-CM

## 2014-04-23 DIAGNOSIS — F0789 Other personality and behavioral disorders due to known physiological condition: Secondary | ICD-10-CM

## 2014-04-23 DIAGNOSIS — R0681 Apnea, not elsewhere classified: Secondary | ICD-10-CM

## 2014-04-23 DIAGNOSIS — R29818 Other symptoms and signs involving the nervous system: Secondary | ICD-10-CM

## 2014-04-23 DIAGNOSIS — R55 Syncope and collapse: Secondary | ICD-10-CM

## 2014-04-23 DIAGNOSIS — F4489 Other dissociative and conversion disorders: Secondary | ICD-10-CM

## 2014-04-23 DIAGNOSIS — F070 Personality change due to known physiological condition: Secondary | ICD-10-CM

## 2014-04-23 DIAGNOSIS — F0391 Unspecified dementia with behavioral disturbance: Secondary | ICD-10-CM

## 2014-04-23 DIAGNOSIS — R2689 Other abnormalities of gait and mobility: Secondary | ICD-10-CM

## 2014-04-23 MED ORDER — QUETIAPINE FUMARATE 25 MG PO TABS: 50.0000 mg | ORAL_TABLET | Freq: Two times a day (BID) | ORAL | Status: DC

## 2014-04-23 NOTE — Patient Instructions (Signed)
Overall you are doing fairly well but I do want to suggest a few things today:   Remember to drink plenty of fluid, eat healthy meals and do not skip any meals. Try to eat protein with a every meal and eat a healthy snack such as fruit or nuts in between meals. Try to keep a regular sleep-wake schedule and try to exercise daily, particularly in the form of walking, 20-30 minutes a day, if you can.   As far as your medications are concerned, I would like to suggest Seroquel one tab twice daily. May increase to 2 tabs twice daily as needed for insomnia and agitation.  As far as diagnostic testing: Carotid Dopplers, labwork  I would like to see you back in 3 months, sooner if we need to. Please call us with any interim questions, concerns, problems, updates or refill requests.   Please also call us for any test results so we can go over those with you on the phone.  My clinical assistant and will answer any of your questions and relay your messages to me and also relay most of my messages to you.   Our phone number is [phone]. We also have an after hours call service for urgent matters and there is a physician on-call for urgent questions. For any emergencies you know to call 911 or go to the nearest emergency room

## 2014-04-23 NOTE — Progress Notes (Addendum)
[facility]    Provider:  [name] Referring Provider: [name], MD Primary Care Physician:  [name], MD  CC:  Dizziness, memory loss and agitation, spells  HPI:  Philip Moreno is a 73 y.o. male here as a consult from Dr. Jaquelyn Bitter B for Dizziness, memory loss and agitation, spells  73 year old male here for imbalance, Dizziness, memory loss and agitation, spells. Wife accompanies patient and she provides most of the information regarding symptoms due to patient's cognitive dysfunction. When he gets up he gets dizzy. He has to hold onto something and feels like he has no balance. Been going on for 6 months or more. Happens anytime, when getting up,  when sitting or can happen when walking. When it happens, he gets hot, breaks out in a sweat. He can't describe the feeling, make presyncopal but he does not fall. He says it is dizzyness but no room spinning and denies being lightheaded.  No LOC, no falling, no nausea, no chest pain, just feels weak and dizzy. During these episodes, he understands everything but can't respond and can't speak until the episode passes. No confusion afterwards. No loss of bladder or bowel. No seizure-like movements. Happens up to 4x a day. Lasts up to 10 seconds. Has been diagnosed with dementia recently and wife describes much agitation. He was evaluated at a neurologist's office at [name] and did an EEG which wife reports is normal. Was ordered carotid dopplers but never called them back to schedule. Had a cat scan of the brain but never an MRI. No triggers that they know of, not due to lack of eating or fluids. He had a heart stress test years ago but not since the symptoms started.    He is not sleeping. Waking up frequently. Waking up 1-2am and can't go back to bed. Goes to bed at 10. +snoring. Wife noticing apneic events. Denies sleepiness however. No daytime naps. He has headaches. Can't tell me how often, wife says at least once a week.  Chronic.  Progressive memory difficulties. He works with his hands, Medical laboratory scientific officer. 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 He can't remember parties he recently went to. Going on for at least 8 months. Progressively worsening. He is getting agitated. Symptoms getting severe, he is yelling at wife, causing significant distress within the family.  No FHx of neurodegenerative disease such as dementia, no neuromuscular family history. Patient's mother is still living and is over 100.   Reviewed notes, labs and imaging from outside physicians, which showed CT of the head with only mild atrophy and chronic microvascular ischemic changes. Notes state patient has been having spells with blurry vision, dizziness and confusion (however patient and wife today deny confusion during or after the spells). Cognitive problems started around January 2015. Last BMP and CBC only significant for mildly reduced GFR (08/2012) no other pertinent labs in the record.   Review of Systems: Patient complains of symptoms per HPI as well as the following symptoms cough, runny nose, memory loss, confusion, insomnia, not enough sleep. Pertinent negatives per HPI. Otherwise out of a complete 14 system review, and all other reviewed systems are negative.   History   Social History  . Marital Status: Married    Spouse Name: Philip Moreno     Number of Children: 0  .  Years of Education: 7   Occupational History  . farmer     Social History Main Topics  . Smoking status: Never Smoker   . Smokeless tobacco: Current User    Types: Chew, Snuff  . Alcohol Use: No  . Drug Use: No  . Sexual Activity:    Other Topics Concern  . Not on file   Social History Narrative   Patient lives at home.    Patient is a part time farmer    Patient has a 7th grade education.    Patient has no children.       Family History  Problem Relation Age of Onset  . Hypertension Mother   . Heart attack Father     Past Medical History  Diagnosis Date  . Hypertension   . GERD (gastroesophageal reflux disease)   . Cancer     prostate cancer   . Arthritis     Past Surgical History  Procedure Laterality Date  . Robot assisted laparoscopic radical prostatectomy  08/25/2012    Procedure: ROBOTIC ASSISTED LAPAROSCOPIC RADICAL PROSTATECTOMY LEVEL 2;  Surgeon: Dutch Gray, MD;  Location: [facility];  Service: Urology;  Laterality: N/A;  . Lymphadenectomy  08/25/2012    Procedure: LYMPHADENECTOMY;  Surgeon: Dutch Gray, MD;  Location: [facility];  Service: Urology;  Laterality: Bilateral;  . Prostate surgery  2012    Current Outpatient Prescriptions  Medication Sig Dispense Refill  . DONEPEZIL HCL PO Take by mouth.      Marland Kitchen HYDROcodone-acetaminophen (NORCO) 5-325 MG per tablet Take 1-2 tablets by mouth every 6 (six) hours as needed for pain.  30 tablet  0  . LOSARTAN POTASSIUM PO Take by mouth.      . QUEtiapine (SEROQUEL) 25 MG tablet Take 2 tablets (50 mg total) by mouth 2 (two) times daily. Start with 25mg (one tab) twice daily. Can increase to 50mg  (2 tabs) twice daily.  120 tablet  3   No current facility-administered medications for this visit.    Allergies as of 04/23/2014  . (No Known Allergies)    Vitals: BP 107/68  Pulse 74  Ht 6' 1.5" (1.867 m)  Wt 207 lb (93.895 kg)  BMI 26.94 kg/m2 Last Weight:  Wt Readings from Last 1 Encounters:  04/23/14 207 lb (93.895 kg)   Last Height:   Ht Readings from Last 1 Encounters:  04/23/14 6' 1.5" (1.867 m)    Physical exam: Exam: Gen: NAD, conversant, well nourised, well groomed. He becomes mildly agitated.                    CV: RRR, no MRG. No Carotid Bruits. No peripheral edema, warm, nontender Eyes: Conjunctivae clear without exudates or hemorrhage  Neuro: Detailed Neurologic Exam  Speech:    Speech is normal; fluent and spontaneous with  normal comprehension.  Cognition:    The patient is oriented to person, place, and time;     recent memory is impaired; remote memory is intact    language fluent;     normal attention, concentration,      Normal  fund of knowledge for education     He is surprisingly insightful regarding his memory loss and agitation Cranial Nerves:    The pupils are equal, round, and reactive to light. The fundi are normal and spontaneous venous pulsations are present. Visual fields are full to finger confrontation. Extraocular movements are intact. Trigeminal sensation is intact and the muscles of mastication are normal. The face is  symmetric. The palate elevates in the midline. Voice is normal. Shoulder shrug is normal. The tongue has normal motion without fasciculations.   Coordination:    Normal finger to nose and heel to shin. Normal rapid alternating movements.   Gait:    Heel-toe and tandem gait are normal.   Motor Observation:    No asymmetry, no atrophy, and no involuntary movements noted. Tone:    Normal muscle tone.    Posture:    Posture is normal. normal erect    Strength:    Strength is V/V in the upper and lower limbs. Mild right pronator drift.      Sensation: Intact to LT, pin prick and cold. Romberg negative.     Reflex Exam:  DTR's:    Deep tendon reflexes in the upper and lower extremities are normal Toes:    Patient withdraws briskly, cannot test for flexor/flexor reflex Clonus:    Clonus is absent.      Assessment/Plan:  73 year old male with a PMHx of cognitive impairment who is here for daily "spells". Neuro exam significant for mild agitation and impaired recent memory. Patient is a poor historian and wife provides most of the information. The spells occur daily and last briefly, 10 seconds or less. Can happen at anytime, sitting or standing or walking. Patient reports getting dizzy during the spells but denies lightheaded and denies room spinning; can't explain  the feeling. Wife and patient deny any confusion during or after the spells and there is no loss of consciousness however the patient reports feeling hot and then having to hold onto something to stop from falling. Does not sound vertiginous and not classically seizure like(at least one EEG was negative per patient and wife). Spells are concerning for pre-syncopal episodes. Will refer to cardiology and order carotid dopplers. Encouraged cardiology follow up as structural or electrical heart problems can be fatal. Would like to repeat EEG for seizure evaluation pending previous results.   Also appears to have dementia-like progression of cognitive impairment with recent agitation. Insomnia and frequent awakenings could be part of the dementia. Patient is a bit agitated with nurse today, and says he already had testing at last neurologist. Will get records to review EEG findings and MoCA. Will repeat MoCA at next exam and every 3-6 months afterwards to follow impairement. Continue AchE inhibitor. Will order dementia lab screen (wife ays neurologists did not do labwork). Seroquel for insomnia and agitation.  Snoring with apneic episodes: Will order a sleep test at next appoitnemtn or at least discuss with patient and wife. Worry if I order too much today, patient may become upset (complained about the previous EEG and his copay).  Went to [place] [facility] neurolog[facility] previous [place]. Will request records.  A total of 75 minutes was spent in with this patient. Over half this time was spent on counseling patient on the diagnosis and different therapeutic options available.    Sarina Ill, MD  [facility] [address] [place], [place] [address]  Phone [phone] Fax [phone]   Lenor Coffin

## 2014-04-24 DIAGNOSIS — M1990 Unspecified osteoarthritis, unspecified site: Secondary | ICD-10-CM | POA: Insufficient documentation

## 2014-04-26 ENCOUNTER — Telehealth: Payer: Self-pay | Admitting: Neurology

## 2014-04-26 ENCOUNTER — Other Ambulatory Visit: Payer: Self-pay | Admitting: Neurology

## 2014-04-26 ENCOUNTER — Ambulatory Visit (INDEPENDENT_AMBULATORY_CARE_PROVIDER_SITE_OTHER): Payer: Medicare Other

## 2014-04-26 DIAGNOSIS — R4020 Unspecified coma: Secondary | ICD-10-CM

## 2014-04-26 DIAGNOSIS — R42 Dizziness and giddiness: Secondary | ICD-10-CM

## 2014-04-26 DIAGNOSIS — R404 Transient alteration of awareness: Secondary | ICD-10-CM

## 2014-04-26 DIAGNOSIS — G458 Other transient cerebral ischemic attacks and related syndromes: Secondary | ICD-10-CM

## 2014-04-26 DIAGNOSIS — R55 Syncope and collapse: Secondary | ICD-10-CM

## 2014-04-26 LAB — HIV ANTIBODY (ROUTINE TESTING W REFLEX): HIV-1/HIV-2 Ab: NONREACTIVE

## 2014-04-26 LAB — COMPREHENSIVE METABOLIC PANEL
A/G RATIO: 1.7 (ref 1.1–2.5)
ALT: 17 IU/L (ref 0–44)
AST: 17 IU/L (ref 0–40)
Albumin: 4.7 g/dL (ref 3.5–4.8)
Alkaline Phosphatase: 43 IU/L (ref 39–117)
BILIRUBIN TOTAL: 0.3 mg/dL (ref 0.0–1.2)
BUN/Creatinine Ratio: 14 (ref 10–22)
BUN: 26 mg/dL (ref 8–27)
CHLORIDE: 99 mmol/L (ref 97–108)
CO2: 22 mmol/L (ref 18–29)
Calcium: 9.5 mg/dL (ref 8.6–10.2)
Creatinine, Ser: 1.83 mg/dL — ABNORMAL HIGH (ref 0.76–1.27)
GFR, EST AFRICAN AMERICAN: 42 mL/min/{1.73_m2} — AB (ref >59)
GFR, EST NON AFRICAN AMERICAN: 36 mL/min/{1.73_m2} — AB (ref >59)
GLUCOSE: 87 mg/dL (ref 65–99)
Globulin, Total: 2.7 g/dL (ref 1.5–4.5)
POTASSIUM: 4.1 mmol/L (ref 3.5–5.2)
SODIUM: 140 mmol/L (ref 134–144)
TOTAL PROTEIN: 7.4 g/dL (ref 6.0–8.5)

## 2014-04-26 LAB — FOLATE: FOLATE: 11.2 ng/mL (ref >3.0)

## 2014-04-26 LAB — VITAMIN B12: VITAMIN B 12: 225 pg/mL (ref 211–946)

## 2014-04-26 LAB — VITAMIN B1, WHOLE BLOOD: THIAMINE: 141.3 nmol/L (ref 66.5–200.0)

## 2014-04-26 LAB — AMMONIA: AMMONIA: 79 ug/dL (ref 27–102)

## 2014-04-26 LAB — METHYLMALONIC ACID, SERUM: METHYLMALONIC ACID: 249 nmol/L (ref 0–378)

## 2014-04-26 LAB — TSH: TSH: 3.27 u[IU]/mL (ref 0.450–4.500)

## 2014-04-26 LAB — SEDIMENTATION RATE: Sed Rate: 8 mm/hr (ref 0–30)

## 2014-04-26 LAB — RPR: SYPHILIS RPR SCR: NONREACTIVE

## 2014-04-26 NOTE — Telephone Encounter (Signed)
Spoke to spouse. She says patient is insisting to speak w/ Dr. Jaynee Eagles about lab results. Advised would notify Dr. Jaynee Eagles. Spouse agreed.

## 2014-04-26 NOTE — Telephone Encounter (Signed)
I will call her. Try to remind me too in the morning if you remember.

## 2014-04-26 NOTE — Procedures (Signed)
    History:  Philip Moreno is a 73 year old gentleman with a history of gait imbalance, dizziness, memory loss and episodes of agitation. At times, the patient may feel as if he may faint, but he does not lose consciousness. The patient is being evaluated for the above episodes.  This is a routine EEG. No skull defects are noted. Medications include donepezil, hydrocodone, losartan, and Seroquel.   EEG classification: Normal awake  Description of the recording: The background rhythms of this recording consists of a fairly well modulated medium amplitude alpha rhythm of 10 Hz that is reactive to eye opening and closure. As the record progresses, the patient appears to remain in the waking state throughout the recording. Photic stimulation was not performed. Hyperventilation was performed, resulting in a minimal buildup of the background rhythm activities without significant slowing seen. At no time during the recording does there appear to be evidence of spike or spike wave discharges or evidence of focal slowing. EKG monitor shows no evidence of cardiac rhythm abnormalities with a heart rate of 72.  Impression: This is a normal EEG recording in the waking state. No evidence of ictal or interictal discharges are seen.

## 2014-04-26 NOTE — Telephone Encounter (Signed)
Patient's wife calling to request that Dr. Jaynee Eagles speak with her regarding today's EEG, please return call and advise.

## 2014-04-27 ENCOUNTER — Telehealth: Payer: Self-pay | Admitting: Neurology

## 2014-04-27 NOTE — Telephone Encounter (Signed)
Patient had an event where she found him on the floor with eyes open and not responsive. Had him in the office next day for EEG which was normal. Called wife and discussed. Discussed labs. Pending MRI of brain and cardiology referral.

## 2014-04-27 NOTE — Telephone Encounter (Signed)
I called earlier and spoke with her at length about the EEG and labs. Thanks!

## 2014-05-01 NOTE — Telephone Encounter (Signed)
There was an MRI ordered on 04/26/14. Forwarding to MD - Do you still want the patient to get an MRI?

## 2014-05-01 NOTE — Telephone Encounter (Signed)
Patient's wife calling to check when Dr. Jaynee Eagles wants patient to have an MRI, please return call and advise.

## 2014-05-02 NOTE — Telephone Encounter (Signed)
Philip Moreno - would you call back Philip Moreno and tell her maybe we should hold off on the MRI of the brain until after Philip Moreno sees Cardiology. If cardiology doesn't give [place] an answer for his symptoms then we can consider the MRI again. I hate to put Philip Moreno in the MRI machine with his cognitive difficulties.   Would you also let her know that the dopplers of his carotid arteries were normal please.  If it is ok with Philip Moreno then I can let Philip Moreno know I will cancel the MRI. Thank you!

## 2014-05-08 ENCOUNTER — Encounter: Payer: Self-pay | Admitting: Cardiovascular Disease

## 2014-05-08 ENCOUNTER — Ambulatory Visit: Payer: Medicare Other

## 2014-05-08 ENCOUNTER — Ambulatory Visit
Admission: RE | Admit: 2014-05-08 | Discharge: 2014-05-08 | Disposition: A | Discharge status: Home / Self Care | Payer: Medicare Other | Source: Ambulatory Visit | Attending: Neurology | Admitting: Neurology

## 2014-05-08 ENCOUNTER — Ambulatory Visit (INDEPENDENT_AMBULATORY_CARE_PROVIDER_SITE_OTHER): Payer: Medicare Other | Admitting: Cardiovascular Disease

## 2014-05-08 ENCOUNTER — Other Ambulatory Visit: Payer: Self-pay | Admitting: Neurology

## 2014-05-08 VITALS — BP 130/92 | HR 69 | Ht 74.0 in | Wt 209.0 lb

## 2014-05-08 DIAGNOSIS — G458 Other transient cerebral ischemic attacks and related syndromes: Secondary | ICD-10-CM

## 2014-05-08 DIAGNOSIS — I10 Essential (primary) hypertension: Secondary | ICD-10-CM

## 2014-05-08 DIAGNOSIS — G459 Transient cerebral ischemic attack, unspecified: Secondary | ICD-10-CM

## 2014-05-08 DIAGNOSIS — F0789 Other personality and behavioral disorders due to known physiological condition: Secondary | ICD-10-CM

## 2014-05-08 DIAGNOSIS — R55 Syncope and collapse: Secondary | ICD-10-CM

## 2014-05-08 DIAGNOSIS — F09 Unspecified mental disorder due to known physiological condition: Secondary | ICD-10-CM

## 2014-05-08 NOTE — Assessment & Plan Note (Signed)
Well controlled.  Continue current medications and low sodium Dash type diet.    

## 2014-05-08 NOTE — Patient Instructions (Signed)
Your physician recommends that you schedule a follow-up appointment in: EP  AS  [place]  [place] Your physician recommends that you continue on your current medications as directed. Please refer to the Current Medication list given to you today.  Your physician has requested that you have an echocardiogram. Echocardiography is a painless test that uses sound waves to create images of your heart. It provides your doctor with information about the size and shape of your heart and how well your heart's chambers and valves are working. This procedure takes approximately one hour. There are no restrictions for this procedure.

## 2014-05-08 NOTE — Assessment & Plan Note (Signed)
Benign history , exam and ECG suggests non cardiac etiology.  Have suggested echo with bubble study and referral to EP for possible loop recorder but doubtful patient will keep appointments.  Not postural in office.  QT normal on ECG

## 2014-05-08 NOTE — Assessment & Plan Note (Signed)
Agree with MRI to r/o basilar/posterior circulation stroke  Cognitive deficits and mood disorder strongly suggest this is a CNS issue not cardiac F/U with [name]

## 2014-05-08 NOTE — Progress Notes (Signed)
Patient ID: Philip Moreno, male   DOB: 1941/01/15, 73 y.o.   MRN: [id number]    73 yo referred by [facility] Neuro for dizzy spells.  Note should be made patient very agitated and non cooperative during interview and wife supplied most of the information.  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  Seen here in [place] and I reviewed carotids. Mild plaque no stenosis.  He has a daily sensation of imbalance and difficulty with gait.  No vertigo  No focal neuro deficits  Periods of unresponsiveness with wife Indicating seizure like activity No incontinence.  No chest pain palpitations or dyspnea No previous cardiac problems.  Symptoms not always postura.  Episodes getting worse along with cognitive deficits and mood disorder.  Aricept has not helped.       ROS: Denies fever, malais, weight loss, blurry vision, decreased visual acuity, cough, sputum, SOB, hemoptysis, pleuritic pain, palpitaitons, heartburn, abdominal pain, melena, lower extremity edema, claudication, or rash.  All other systems reviewed and negative   General: Agitated and combative  Chronically ill black male  HEENT: normal Neck supple with no adenopathy JVP normal no bruits no thyromegaly Lungs clear with no wheezing and good diaphragmatic motion SEM murmur,rub, gallop or click PMI normal Abdomen: benighn, BS positve, no tenderness, no AAA no bruit.  No HSM or HJR Distal pulses intact with no bruits No edema Neuro non-focal Skin warm and dry No muscular weakness  Medications Current Outpatient Prescriptions  Medication Sig Dispense Refill  . amLODipine (NORVASC) 2.5 MG tablet Take by mouth.      Marland Kitchen aspirin EC 81 MG tablet Take by mouth.      . DONEPEZIL HCL PO Take by mouth.      Marland Kitchen LOSARTAN POTASSIUM PO Take by mouth.      . QUEtiapine (SEROQUEL) 25 MG tablet Take 2 tablets (50 mg total) by mouth 2 (two) times daily.  Start with 25mg (one tab) twice daily. Can increase to 50mg  (2 tabs) twice daily.  120 tablet  3  . HYDROcodone-acetaminophen (NORCO) 5-325 MG per tablet Take 1-2 tablets by mouth every 6 (six) hours as needed for pain.  30 tablet  0   No current facility-administered medications for this visit.    Allergies Review of patient's allergies indicates no known allergies.  Family History: Family History  Problem Relation Age of Onset  . Hypertension Mother   . Heart attack Father   . Dementia Neg Hx   . Parkinsonism Neg Hx   . Seizures Neg Hx   . Stroke Neg Hx     Social History: History   Social History  . Marital Status: Married    Spouse Name: Jamelle Haring     Number of Children: 0  . Years of Education: 7   Occupational History  . farmer     Social History Main Topics  . Smoking status: Never Smoker   . Smokeless tobacco: Current User    Types: Chew, Snuff  . Alcohol Use: No  . Drug Use: No  . Sexual Activity: Not on file   Other Topics Concern  . Not on file   Social History Narrative   Patient lives at home.    Patient is a part time farmer    Patient has a 7th grade education.    Patient has no children.     Electrocardiogram: SR rate 69  LAD  otherwise normal   Assessment and Plan

## 2014-05-09 ENCOUNTER — Telehealth: Payer: Self-pay | Admitting: Neurology

## 2014-05-09 ENCOUNTER — Telehealth: Payer: Self-pay | Admitting: *Deleted

## 2014-05-09 NOTE — Telephone Encounter (Signed)
Spoke to wife, discussed MRI results and possible etiologies for his transient confusion/spells and choices for future workup. Will have them back next week to discuss in person. Patient's behavioral problems will make a full work up difficult; dementia with agitation and behavioral disturbances. Will schedule appointment for patinet.   Larey Seat do you think we could open up a Friday 3:30pm appointment for this patient please? Monday would be great. Let me know.

## 2014-05-09 NOTE — Telephone Encounter (Signed)
I spoke with Mrs. Fulop she says the cardiologist did not find anything. They had the MRI done on yesterday so they will wait to hear back from you with results. I also let her know carotid doppler studies normal.

## 2014-05-09 NOTE — Telephone Encounter (Signed)
Informed patient's wife that MRI takes a few days to read. Dr. Jaynee Eagles will follow -up with results. Patient's wife verbalized understanding.

## 2014-05-09 NOTE — Telephone Encounter (Signed)
Called patient wife to give her Dr. Cathren Laine message in regards to MRI. There was not a voicemail on either home or cell.

## 2014-05-09 NOTE — Telephone Encounter (Signed)
Philip Moreno - you mind calling him back and just letting him know that the MRI takes a few days to read? So I may not have results until tomorrow or Friday and will call as soon as I have results. thanks

## 2014-05-10 ENCOUNTER — Other Ambulatory Visit: Payer: Medicare Other

## 2014-05-10 NOTE — Telephone Encounter (Signed)
Spoke to spouse. Scheduled appt on 05/14/14 @ 3:30pm, arrival @ 3:15pm. Spouse agreed.

## 2014-05-13 ENCOUNTER — Other Ambulatory Visit: Payer: Medicare Other

## 2014-05-14 ENCOUNTER — Encounter: Payer: Self-pay | Admitting: Neurology

## 2014-05-14 ENCOUNTER — Telehealth: Payer: Self-pay | Admitting: Neurology

## 2014-05-14 ENCOUNTER — Ambulatory Visit (INDEPENDENT_AMBULATORY_CARE_PROVIDER_SITE_OTHER): Payer: Medicare Other | Admitting: Neurology

## 2014-05-14 VITALS — BP 104/70 | HR 85 | Ht 74.0 in | Wt 209.8 lb

## 2014-05-14 DIAGNOSIS — I639 Cerebral infarction, unspecified: Secondary | ICD-10-CM

## 2014-05-14 NOTE — Progress Notes (Addendum)
[facility]    Provider:  [name] Referring Provider: [name], MD Primary Care Physician:  [name], MD  CC:  Philip Moreno is a 73 y.o. male here as a consult from Dr. Jaquelyn Bitter B for Dizziness, memory loss and agitation, spells   HPI: Philip Moreno is a 73 y.o. male here as a consult from Dr. Jaquelyn Bitter B for Dizziness, memory loss and agitation, spells  73 year old male here for imbalance, Dizziness, memory loss and agitation, spells. Wife accompanies patient and she provides most of the information regarding symptoms due to patient's cognitive dysfunction. Dizziness persists, has had it multiple times since last appointment. 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 Here to review images of the brain and discuss next steps. He reports he feels "crazy in his head right now" and is having one of his episodes as he sits in the chair, ausculation of his heart demonstrates RRR during the episode that lasts several minutes. Says the room is not spinning. He can't get the eyes to focus. No feeling of passing out. He can't describe it, there is something in his head and he needs to be still for a moment. Denies dizziness. Denies lightheadedness. Denies CP.   He had an episode where he was in the bathroom on the toilet. Wife found him on the floor. He was sweating, told wife he couldn't breath and then he slumped over like a rag doll and he was in space staring. Lasted 3-4 seconds, then he raised his head up and he gasped like he was through an ordeal, knew where he was, who is wife was, had urinated on the floor next to the toilet. No abnormal shaking movements, no biting of the tongue but tongue was sticking out of the mouth. He was making groaning noises   Reviewed notes, labs and imaging from outside physicians, which showed: Personally reviewed images of the brain MRI that showed  multiple, small strokes of uncertain age in the bilateral cerebellum, moderate atrophy of the brain.  Normal awake EEG. No significant stenosis of carotid dopplers, cardiology exam and ECG suggests non cardiac etiology. B12, TSH WNL.    Review of Systems: Patient complains of symptoms per HPI as well as the following symptoms blurred vision, cough, chest tightness, insomnia, frequency of urination, testicular pain, back pain, memory loss, dizziness, passing out, agitation. Pertinent negatives per HPI. All others negative.   History   Social History  . Marital Status: Married    Spouse Name: Jamelle Haring     Number of Children: 0  . Years of Education: 7   Occupational History  . farmer     Social History Main Topics  . Smoking status: Never Smoker   . Smokeless tobacco: Current User    Types: Chew, Snuff  . Alcohol Use: No  . Drug Use: No  . Sexual Activity: Not on file   Other Topics Concern  . Not on file   Social History Narrative   Patient lives at home.    Patient is a part time farmer    Patient has a 7th grade education.    Patient has no children.     Family History  Problem Relation Age of Onset  . Hypertension Mother   . Heart attack Father   . Dementia Neg Hx   . Parkinsonism Neg Hx   . Seizures Neg Hx   . Stroke  Neg Hx     Past Medical History  Diagnosis Date  . Hypertension   . GERD (gastroesophageal reflux disease)   . Cancer     prostate cancer   . Arthritis     Past Surgical History  Procedure Laterality Date  . Robot assisted laparoscopic radical prostatectomy  08/25/2012    Procedure: ROBOTIC ASSISTED LAPAROSCOPIC RADICAL PROSTATECTOMY LEVEL 2;  Surgeon: Dutch Gray, MD;  Location: [facility];  Service: Urology;  Laterality: N/A;  . Lymphadenectomy  08/25/2012    Procedure: LYMPHADENECTOMY;  Surgeon: Dutch Gray, MD;  Location: [facility];  Service: Urology;  Laterality: Bilateral;  . Prostate surgery  2012    Current Outpatient Prescriptions    Medication Sig Dispense Refill  . amLODipine (NORVASC) 10 MG tablet Take 10 mg by mouth daily.      Marland Kitchen aspirin EC 81 MG tablet Take by mouth.      . DONEPEZIL HCL PO Take 5 mg by mouth daily.       Marland Kitchen LOSARTAN POTASSIUM PO Take by mouth.      . QUEtiapine (SEROQUEL) 25 MG tablet Take 2 tablets (50 mg total) by mouth 2 (two) times daily. Start with 25mg (one tab) twice daily. Can increase to 50mg  (2 tabs) twice daily.  120 tablet  3   No current facility-administered medications for this visit.    Allergies as of 05/14/2014  . (No Known Allergies)    Vitals: BP 104/70  Pulse 85  Ht 6\' 2"  (1.88 m)  Wt 209 lb 12.8 oz (95.165 kg)  BMI 26.93 kg/m2 Last Weight:  Wt Readings from Last 1 Encounters:  05/14/14 209 lb 12.8 oz (95.165 kg)   Last Height:   Ht Readings from Last 1 Encounters:  05/14/14 6\' 2"  (1.88 m)       Physical exam:  Exam:  Gen: NAD, conversant, well nourised, well groomed. He becomes mildly agitated.  CV: RRR, no MRG. No Carotid Bruits. No peripheral edema, warm, nontender  Eyes: Conjunctivae clear without exudates or hemorrhage  Neuro: NO CHANGE Detailed Neurologic Exam  Speech:  Speech is normal; fluent and spontaneous with normal comprehension.  Cognition:  The patient is oriented to person, place, and time;  recent memory is impaired; remote memory is intact  language fluent;  normal attention, concentration,  Normal fund of knowledge for education  He is surprisingly insightful regarding his memory loss and agitation  Cranial Nerves:  The pupils are equal, round, and reactive to light. The fundi are normal and spontaneous venous pulsations are present. Visual fields are full to finger confrontation. No nystagmus.  Extraocular movements are intact. Trigeminal sensation is intact and the muscles of mastication are normal. The face is symmetric. The palate elevates in the midline. Voice is normal. Shoulder shrug is normal. The tongue has normal motion  without fasciculations.  Coordination:  Normal finger to nose and heel to shin. Normal rapid alternating movements.  Gait:  Heel-toe and tandem gait are normal.  Motor Observation:  No asymmetry, no atrophy, and no involuntary movements noted.  Tone:  Normal muscle tone.  Posture:  Posture is normal. normal erect  Strength:  Strength is V/V in the upper and lower limbs. Mild right pronator drift.  Sensation: Intact to LT, pin prick and cold. Romberg negative.  Reflex Exam:  DTR's:  Deep tendon reflexes in the upper and lower extremities are normal  Toes:  Patient withdraws briskly, cannot test for flexor/flexor reflex  Clonus:  Clonus is absent.  Assessment/Plan:  73 year old male with a PMHx of cognitive impairment who is here for daily "spells". Neuro exam significant for mild agitation and impaired recent memory. Patient is a poor historian and wife provides most of the information. The spells occur daily and last briefly, 10 seconds or less. Can happen at anytime, sitting or standing or walking. Patient denies getting dizzy during the spells, can't describe it, but denies lightheaded and denies room spinning; can't explain the feeling "I feel crazy in my head". Wife and patient deny any confusion during or after the spells however the patient reports feeling hot and then having to hold onto something to stop from falling. One episode of transient alteration of consciousness while on the toilet, wife came in and found him on the ground and he said he was SOB and his head slumped over for 3-4 seconds without confusion afterwards but he did lose his urine on the ground.   Personally reviewed images of the brain MRI that showed multiple, small strokes of uncertain age in the bilateral cerebellum, moderate atrophy of the brain.  Normal awake EEG(x2). No significant stenosis of carotid dopplers, cardiology exam and ECG suggests non cardiac etiology but suggested echo and implantable loop  recorder. B12, TSH WNL.   Had a long conversation with the patient and his wife. He just started taking ASA this year so not clear if the strokes happened before started ASA or not. Could switch to Plavix. Could also image the posterior circulation with MRA, they are hesitant to do either. Recommend Echo of the heart. Unclear if these spells are seizures or other symptoms secondary to the cerebellar strokes. Will schedule an echo for patient and then speak with wife and see what she would like to do next.   Continue aricept and seroquel.  A total of 90 minutes was spent in with this patient. Over half this time was spent on counseling patient on the diagnosis and different therapeutic options available.    Sarina Ill, MD  [facility] [address] [place], [place] [address]  Phone [phone] Fax [phone] Lenor Coffin

## 2014-05-14 NOTE — Telephone Encounter (Signed)
Larey Seat - I just put in a referral for Philip Moreno to get an echo at [facility]  Dr. Collier Salina C. Johnsie Cancel, MD ?  Cardiologist  Address: [address] [place], [place] [address]  [phone]  Would you make sure it gets scheduled please? And give his wife a call about the date. Thank you.

## 2014-05-15 ENCOUNTER — Telehealth: Payer: Self-pay | Admitting: Neurology

## 2014-05-15 NOTE — Telephone Encounter (Signed)
Sonya - About Philip Moreno's Echo, did you let him know or should I or Casandra  call him? Let us know, thank you for taking care of that! Thanks!!! [employer]

## 2014-05-15 NOTE — Telephone Encounter (Signed)
This has been scheduled 05/18/14 @ 9am. A staff message went out to all providers in regards to order to cardiology procedures.  The 1st dx can not be stroke, it has to be a heart related dx, then you may use stroke 2nd. Patient had dizziness so please change order using dizziness 1st and stroke 2nd.

## 2014-05-15 NOTE — Telephone Encounter (Signed)
Patient echo appointment 05/18/14 @ 9 am [place] [address]. Patient wife aware of appointment.

## 2014-05-16 ENCOUNTER — Telehealth: Payer: Self-pay | Admitting: Neurology

## 2014-05-16 NOTE — Telephone Encounter (Signed)
Patients wife aware of appointment 05/18/14.

## 2014-05-16 NOTE — Telephone Encounter (Signed)
Suanne Marker with [facility] is calling regarding patient. Patient has an Echo scheduled for Friday and needs pre-authorization. The procedure code is [id number].  Please call and advise and may leave a message.

## 2014-05-17 NOTE — Telephone Encounter (Signed)
casandra can you please take care of this asap?

## 2014-05-17 NOTE — Telephone Encounter (Signed)
Suanne Marker calling again to state that patient needs authorization for Echo, if it is not done by today at 2 PM the appointment will be cancelled. Please call her back at [phone].

## 2014-05-17 NOTE — Telephone Encounter (Signed)
Called patient's insurance and obtained prior authorization. Called Rhonda @ [place] and gave approval# [id number].

## 2014-05-18 ENCOUNTER — Ambulatory Visit (HOSPITAL_COMMUNITY)
Admission: RE | Admit: 2014-05-18 | Discharge: 2014-05-18 | Disposition: A | Discharge status: Home / Self Care | Payer: Medicare Other | Source: Ambulatory Visit | Attending: Cardiology | Admitting: Cardiology

## 2014-05-18 DIAGNOSIS — I10 Essential (primary) hypertension: Secondary | ICD-10-CM | POA: Insufficient documentation

## 2014-05-18 DIAGNOSIS — Z8249 Family history of ischemic heart disease and other diseases of the circulatory system: Secondary | ICD-10-CM | POA: Insufficient documentation

## 2014-05-18 DIAGNOSIS — I639 Cerebral infarction, unspecified: Secondary | ICD-10-CM | POA: Diagnosis present

## 2014-05-18 DIAGNOSIS — I359 Nonrheumatic aortic valve disorder, unspecified: Secondary | ICD-10-CM

## 2014-05-18 NOTE — Progress Notes (Signed)
2D Echocardiogram Complete with Bubble Study.  05/18/2014   Deliah Boston, RDCS  Preliminary Technician Findings:   The Bubble Study was positive.

## 2014-05-21 ENCOUNTER — Telehealth: Payer: Self-pay | Admitting: Neurology

## 2014-05-21 NOTE — Telephone Encounter (Signed)
Patient's wife calling to request patient's carotid doppler results, please return call and advise.

## 2014-05-22 NOTE — Telephone Encounter (Signed)
Patient's wife calling again for patient's carotid doppler results, states that patient is very anxious, please return call and advise.

## 2014-05-22 NOTE — Telephone Encounter (Signed)
Spoke to wife, discussed Echo results. Will order imaging of the neck vessels to work up for stroke.

## 2014-05-23 ENCOUNTER — Other Ambulatory Visit: Payer: Self-pay | Admitting: Neurology

## 2014-05-23 DIAGNOSIS — G464 Cerebellar stroke syndrome: Secondary | ICD-10-CM

## 2014-05-23 DIAGNOSIS — I639 Cerebral infarction, unspecified: Secondary | ICD-10-CM

## 2014-05-24 ENCOUNTER — Institutional Professional Consult (permissible substitution): Payer: Medicare Other | Admitting: Cardiology

## 2014-07-23 ENCOUNTER — Ambulatory Visit: Payer: Medicare Other | Admitting: Neurology

## 2015-02-14 ENCOUNTER — Ambulatory Visit (INDEPENDENT_AMBULATORY_CARE_PROVIDER_SITE_OTHER): Payer: Medicare Other | Admitting: General Surgery

## 2015-02-14 ENCOUNTER — Encounter: Payer: Self-pay | Admitting: General Surgery

## 2015-02-14 VITALS — BP 120/74 | HR 76 | Resp 14 | Ht 75.0 in | Wt 212.0 lb

## 2015-02-14 DIAGNOSIS — Z1211 Encounter for screening for malignant neoplasm of colon: Secondary | ICD-10-CM | POA: Diagnosis not present

## 2015-02-14 DIAGNOSIS — K5900 Constipation, unspecified: Secondary | ICD-10-CM | POA: Diagnosis not present

## 2015-02-14 DIAGNOSIS — Z8546 Personal history of malignant neoplasm of prostate: Secondary | ICD-10-CM | POA: Diagnosis not present

## 2015-02-14 MED ORDER — POLYETHYLENE GLYCOL 3350 17 GM/SCOOP PO POWD: ORAL | Status: DC

## 2015-02-14 NOTE — Patient Instructions (Addendum)
Colonoscopy A colonoscopy is an exam to look at the entire large intestine (colon). This exam can help find problems such as tumors, polyps, inflammation, and areas of bleeding. The exam takes about 1 hour.  LET [facility] CARE PROVIDER KNOW ABOUT:   Any allergies you have.  All medicines you are taking, including vitamins, herbs, eye drops, creams, and over-the-counter medicines.  Previous problems you or members of your family have had with the use of anesthetics.  Any blood disorders you have.  Previous surgeries you have had.  Medical conditions you have. RISKS AND COMPLICATIONS  Generally, this is a safe procedure. However, as with any procedure, complications can occur. Possible complications include:  Bleeding.  Tearing or rupture of the colon wall.  Reaction to medicines given during the exam.  Infection (rare). BEFORE THE PROCEDURE   Ask your health care provider about changing or stopping your regular medicines.  You may be prescribed an oral bowel prep. This involves drinking a large amount of medicated liquid, starting the day before your procedure. The liquid will cause you to have multiple loose stools until your stool is almost clear or light green. This cleans out your colon in preparation for the procedure.  Do not eat or drink anything else once you have started the bowel prep, unless your health care provider tells you it is safe to do so.  Arrange for someone to drive you home after the procedure. PROCEDURE   You will be given medicine to help you relax (sedative).  You will lie on your side with your knees bent.  A long, flexible tube with a light and camera on the end (colonoscope) will be inserted through the rectum and into the colon. The camera sends video back to a computer screen as it moves through the colon. The colonoscope also releases carbon dioxide gas to inflate the colon. This helps your health care provider see the area better.  During  the exam, your health care provider may take a small tissue sample (biopsy) to be examined under a microscope if any abnormalities are found.  The exam is finished when the entire colon has been viewed. AFTER THE PROCEDURE   Do not drive for 24 hours after the exam.  You may have a small amount of blood in your stool.  You may pass moderate amounts of gas and have mild abdominal cramping or bloating. This is caused by the gas used to inflate your colon during the exam.  Ask when your test results will be ready and how you will get your results. Make sure you get your test results. Document Released: 07/17/2000 Document Revised: 05/10/2013 Document Reviewed: 03/27/2013 [facility] Patient Information 2015 [place], [place]. This information is not intended to replace advice given to you by your health care provider. Make sure you discuss any questions you have with your health care provider.  Patient has been scheduled for a colonoscopy on 02-27-15 at [facility].

## 2015-02-14 NOTE — Progress Notes (Signed)
Patient ID: Philip Moreno, male   DOB: 11-07-1940, 74 y.o.   MRN: [id number]  Chief Complaint  Patient presents with  . Other    screening colonoscopy    HPI Philip Moreno is a 74 y.o. male here for evaluation for a colonoscopy. His last colonoscopy was over 30 years ago done at [facility]. He has a history of prostate cancer. He reports he has had problems with constipation for the last year. He has not used fiber supplements. he reports that he uses the bathroom twice weekly.    HPI  Past Medical History  Diagnosis Date  . Hypertension   . GERD (gastroesophageal reflux disease)   . Arthritis   . Cancer 05/2011    prostate cancer, [facility]  . Memory loss     Past Surgical History  Procedure Laterality Date  . Robot assisted laparoscopic radical prostatectomy  08/25/2012    Procedure: ROBOTIC ASSISTED LAPAROSCOPIC RADICAL PROSTATECTOMY LEVEL 2;  Surgeon: Dutch Gray, MD;  Location: [facility];  Service: Urology;  Laterality: N/A;  . Lymphadenectomy  08/25/2012    Procedure: LYMPHADENECTOMY;  Surgeon: Dutch Gray, MD;  Location: [facility];  Service: Urology;  Laterality: Bilateral;  . Prostate surgery  2012    Family History  Problem Relation Age of Onset  . Hypertension Mother   . Heart attack Father   . Cancer Brother   . Heart failure Brother   . Diabetes Brother     Social History History  Substance Use Topics  . Smoking status: Never Smoker   . Smokeless tobacco: Current User    Types: Snuff, Chew  . Alcohol Use: No    No Known Allergies  Current Outpatient Prescriptions  Medication Sig Dispense Refill  . amLODipine (NORVASC) 10 MG tablet Take 10 mg by mouth daily.    Marland Kitchen LOSARTAN POTASSIUM PO Take by mouth.    . Memantine HCl (NAMENDA PO) Take 1 capsule by mouth daily.      No current facility-administered medications for this visit.    Review of Systems Review of Systems  Constitutional: Negative.   Respiratory: Negative.   Cardiovascular: Negative.    Gastrointestinal: Positive for diarrhea, constipation and rectal pain (just before having a bowel movement). Negative for nausea, vomiting, abdominal pain, blood in stool, abdominal distention and anal bleeding.    Blood pressure 120/74, pulse 76, resp. rate 14, height 6\' 3"  (1.905 m), weight 212 lb (96.163 kg).  Physical Exam Physical Exam  Constitutional: He is oriented to person, place, and time. He appears well-developed and well-nourished.  Eyes: Conjunctivae are normal. No scleral icterus.  Neck: Neck supple.  Cardiovascular: Normal rate, regular rhythm and normal heart sounds.   Pulmonary/Chest: Effort normal and breath sounds normal.  Abdominal: Soft. Bowel sounds are normal. There is no hepatomegaly. There is no tenderness. No hernia.  Neurological: He is alert and oriented to person, place, and time.  Skin: Skin is warm and dry.    Data Reviewed Notes reviewed   Assessment   Need for screening colonoscopy. Pt also with difficulty evacuating bowel movement over last 1 yr History of prostate cancer    Plan    Discussed colonoscopy and he is agreeable. Procedure, risks and benefits explained     Colonoscopy with possible biopsy/polypectomy prn: Information regarding the procedure, including its potential risks and complications (including but not limited to perforation of the bowel, which may require emergency surgery to repair, and bleeding) was verbally given to the patient. Educational  information regarding lower instestinal endoscopy was given to the patient. Written instructions for how to complete the bowel prep using Miralax were provided. The importance of drinking ample fluids to avoid dehydration as a result of the prep emphasized.  Patient has been scheduled for a colonoscopy on 02-27-15 at [facility].  PCP/Ref: Dr Mitzi Hansen 02/14/2015, 10:50 AM

## 2015-02-26 MED ORDER — LACTATED RINGERS IV SOLN
INTRAVENOUS | Status: DC
  Administered 2015-02-27: 1000 mL via INTRAVENOUS

## 2015-02-27 ENCOUNTER — Ambulatory Visit
Admission: RE | Admit: 2015-02-27 | Discharge: 2015-02-27 | Disposition: A | Discharge status: Home Health | Payer: Medicare Other | Source: Ambulatory Visit | Attending: General Surgery | Admitting: General Surgery

## 2015-02-27 ENCOUNTER — Ambulatory Visit: Payer: Medicare Other | Admitting: Anesthesiology

## 2015-02-27 ENCOUNTER — Encounter
Admission: RE | Disposition: A | Discharge status: Home Health | Payer: Self-pay | Source: Ambulatory Visit | Attending: General Surgery

## 2015-02-27 ENCOUNTER — Encounter: Payer: Self-pay | Admitting: *Deleted

## 2015-02-27 DIAGNOSIS — R569 Unspecified convulsions: Secondary | ICD-10-CM | POA: Insufficient documentation

## 2015-02-27 DIAGNOSIS — M1990 Unspecified osteoarthritis, unspecified site: Secondary | ICD-10-CM | POA: Insufficient documentation

## 2015-02-27 DIAGNOSIS — I10 Essential (primary) hypertension: Secondary | ICD-10-CM | POA: Diagnosis not present

## 2015-02-27 DIAGNOSIS — Z8673 Personal history of transient ischemic attack (TIA), and cerebral infarction without residual deficits: Secondary | ICD-10-CM | POA: Insufficient documentation

## 2015-02-27 DIAGNOSIS — K6289 Other specified diseases of anus and rectum: Secondary | ICD-10-CM | POA: Insufficient documentation

## 2015-02-27 DIAGNOSIS — Z1211 Encounter for screening for malignant neoplasm of colon: Secondary | ICD-10-CM | POA: Insufficient documentation

## 2015-02-27 DIAGNOSIS — K219 Gastro-esophageal reflux disease without esophagitis: Secondary | ICD-10-CM | POA: Insufficient documentation

## 2015-02-27 DIAGNOSIS — Z79899 Other long term (current) drug therapy: Secondary | ICD-10-CM | POA: Insufficient documentation

## 2015-02-27 DIAGNOSIS — Z8546 Personal history of malignant neoplasm of prostate: Secondary | ICD-10-CM | POA: Diagnosis not present

## 2015-02-27 DIAGNOSIS — R413 Other amnesia: Secondary | ICD-10-CM | POA: Diagnosis not present

## 2015-02-27 DIAGNOSIS — K5900 Constipation, unspecified: Secondary | ICD-10-CM | POA: Diagnosis not present

## 2015-02-27 DIAGNOSIS — R197 Diarrhea, unspecified: Secondary | ICD-10-CM | POA: Insufficient documentation

## 2015-02-27 HISTORY — PX: COLONOSCOPY WITH PROPOFOL: SHX5780

## 2015-02-27 HISTORY — DX: Transient cerebral ischemic attack, unspecified: G45.9

## 2015-02-27 SURGERY — COLONOSCOPY WITH PROPOFOL
Anesthesia: General

## 2015-02-27 MED ORDER — PHENYLEPHRINE HCL 10 MG/ML IJ SOLN
INTRAMUSCULAR | Status: DC | PRN
  Administered 2015-02-27 (×3): 50 ug via INTRAVENOUS

## 2015-02-27 MED ORDER — PROPOFOL INFUSION 10 MG/ML OPTIME
INTRAVENOUS | Status: DC | PRN
  Administered 2015-02-27: 80 ug/kg/min via INTRAVENOUS

## 2015-02-27 MED ORDER — PROPOFOL 10 MG/ML IV BOLUS
INTRAVENOUS | Status: DC | PRN
  Administered 2015-02-27: 30 mg via INTRAVENOUS

## 2015-02-27 NOTE — Interval H&P Note (Signed)
History and Physical Interval Note:  02/27/2015 11:55 AM  Philip Moreno  has presented today for surgery, with the diagnosis of SCREENING  The various methods of treatment have been discussed with the patient and family. After consideration of risks, benefits and other options for treatment, the patient has consented to  Procedure(s): COLONOSCOPY WITH PROPOFOL (N/A) as a surgical intervention .  The patient's history has been reviewed, patient examined, no change in status, stable for surgery.  I have reviewed the patient's chart and labs.  Questions were answered to the patient's satisfaction.     SANKAR,SEEPLAPUTHUR G

## 2015-02-27 NOTE — Transfer of Care (Signed)
Immediate Anesthesia Transfer of Care Note  Patient: Philip Moreno  Procedure(s) Performed: Procedure(s): COLONOSCOPY WITH PROPOFOL (N/A)  Patient Location: PACU  Anesthesia Type:General  Level of Consciousness: sedated and responds to stimulation  Airway & Oxygen Therapy: Patient Spontanous Breathing  Post-op Assessment: Report given to RN and Post -op Vital signs reviewed and stable  Post vital signs: Reviewed and stable  Last Vitals:  Filed Vitals:   02/27/15 1053  BP: 107/79  Pulse: 64  Temp: 36.1 C  Resp: 16    Complications: No apparent anesthesia complications

## 2015-02-27 NOTE — H&P (View-Only) (Signed)
Patient ID: Philip Moreno, male   DOB: 04-17-1941, 74 y.o.   MRN: [id number]  Chief Complaint  Patient presents with  . Other    screening colonoscopy    HPI Philip Moreno is a 74 y.o. male here for evaluation for a colonoscopy. His last colonoscopy was over 30 years ago done at [facility]. He has a history of prostate cancer. He reports he has had problems with constipation for the last year. He has not used fiber supplements. he reports that he uses the bathroom twice weekly.    HPI  Past Medical History  Diagnosis Date  . Hypertension   . GERD (gastroesophageal reflux disease)   . Arthritis   . Cancer 05/2011    prostate cancer, [facility]  . Memory loss     Past Surgical History  Procedure Laterality Date  . Robot assisted laparoscopic radical prostatectomy  08/25/2012    Procedure: ROBOTIC ASSISTED LAPAROSCOPIC RADICAL PROSTATECTOMY LEVEL 2;  Surgeon: Dutch Gray, MD;  Location: [facility];  Service: Urology;  Laterality: N/A;  . Lymphadenectomy  08/25/2012    Procedure: LYMPHADENECTOMY;  Surgeon: Dutch Gray, MD;  Location: [facility];  Service: Urology;  Laterality: Bilateral;  . Prostate surgery  2012    Family History  Problem Relation Age of Onset  . Hypertension Mother   . Heart attack Father   . Cancer Brother   . Heart failure Brother   . Diabetes Brother     Social History History  Substance Use Topics  . Smoking status: Never Smoker   . Smokeless tobacco: Current User    Types: Snuff, Chew  . Alcohol Use: No    No Known Allergies  Current Outpatient Prescriptions  Medication Sig Dispense Refill  . amLODipine (NORVASC) 10 MG tablet Take 10 mg by mouth daily.    Marland Kitchen LOSARTAN POTASSIUM PO Take by mouth.    . Memantine HCl (NAMENDA PO) Take 1 capsule by mouth daily.      No current facility-administered medications for this visit.    Review of Systems Review of Systems  Constitutional: Negative.   Respiratory: Negative.   Cardiovascular: Negative.    Gastrointestinal: Positive for diarrhea, constipation and rectal pain (just before having a bowel movement). Negative for nausea, vomiting, abdominal pain, blood in stool, abdominal distention and anal bleeding.    Blood pressure 120/74, pulse 76, resp. rate 14, height 6\' 3"  (1.905 m), weight 212 lb (96.163 kg).  Physical Exam Physical Exam  Constitutional: He is oriented to person, place, and time. He appears well-developed and well-nourished.  Eyes: Conjunctivae are normal. No scleral icterus.  Neck: Neck supple.  Cardiovascular: Normal rate, regular rhythm and normal heart sounds.   Pulmonary/Chest: Effort normal and breath sounds normal.  Abdominal: Soft. Bowel sounds are normal. There is no hepatomegaly. There is no tenderness. No hernia.  Neurological: He is alert and oriented to person, place, and time.  Skin: Skin is warm and dry.    Data Reviewed Notes reviewed   Assessment   Need for screening colonoscopy. Pt also with difficulty evacuating bowel movement over last 1 yr History of prostate cancer    Plan    Discussed colonoscopy and he is agreeable. Procedure, risks and benefits explained     Colonoscopy with possible biopsy/polypectomy prn: Information regarding the procedure, including its potential risks and complications (including but not limited to perforation of the bowel, which may require emergency surgery to repair, and bleeding) was verbally given to the patient. Educational  information regarding lower instestinal endoscopy was given to the patient. Written instructions for how to complete the bowel prep using Miralax were provided. The importance of drinking ample fluids to avoid dehydration as a result of the prep emphasized.  Patient has been scheduled for a colonoscopy on 02-27-15 at [facility].  PCP/Ref: Dr Mitzi Hansen 02/14/2015, 10:50 AM

## 2015-02-27 NOTE — Op Note (Signed)
[facility]astroenterology Patient Name: Christophr Calix Procedure Date: 02/27/2015 12:03 PM MRN: [id number] Account #: [id number] Date of Birth: 04-30-1941 Admit Type: Outpatient Age: 74 Room: [facility] ENDO ROOM 1 Gender: Male Note Status: Finalized Procedure:         Colonoscopy Indications:       Screening for colorectal malignant neoplasm Providers:         Orlie Pollen, MD Referring MD:      Mikeal Hawthorne. Brynda Greathouse, MD (Referring MD) Medicines:         General Anesthesia Complications:     No immediate complications. Procedure:         Pre-Anesthesia Assessment:                    - General anesthesia under the supervision of an                     anesthesiologist was determined to be medically necessary                     for this procedure based on review of the patient's                     medical history, medications, and prior anesthesia history.                    After obtaining informed consent, the colonoscope was                     passed under direct vision. Throughout the procedure, the                     patient's blood pressure, pulse, and oxygen saturations                     were monitored continuously. The Colonoscope was                     introduced through the anus and advanced to the the cecum,                     identified by the ileocecal valve. The colonoscopy was                     performed without difficulty. The patient tolerated the                     procedure well. The quality of the bowel preparation was                     good. Findings:      The perianal and digital rectal examinations were normal.      The exam was otherwise without abnormality on direct and retroflexion       views. Impression:        - The examination was otherwise normal on direct and                     retroflexion views.                    - No specimens collected. Recommendation:    - Return to primary care physician as previously  scheduled. Procedure Code(s): --- Professional ---  45378, Colonoscopy, flexible; diagnostic, including                     collection of specimen(s) by brushing or washing, when                     performed (separate procedure) CPT copyright 2014 American Medical Association. All rights reserved. The codes documented in this report are preliminary and upon coder review may  be revised to meet current compliance requirements. Orlie Pollen, MD 02/27/2015 12:30:32 PM This report has been signed electronically. Number of Addenda: 0 Note Initiated On: 02/27/2015 12:03 PM Scope Withdrawal Time: 0 hours 3 minutes 12 seconds  Total Procedure Duration: 0 hours 18 minutes 8 seconds       [facility]

## 2015-02-27 NOTE — Anesthesia Preprocedure Evaluation (Signed)
Anesthesia Evaluation  Patient identified by MRN, date of birth, ID band Patient awake    Reviewed: Allergy & Precautions, NPO status , Patient's Chart, lab work & pertinent test results  Airway Mallampati: I  TM Distance: >3 FB Neck ROM: Full    Dental  (+) Edentulous Upper, Edentulous Lower   Pulmonary neg pulmonary ROS,  breath sounds clear to auscultation  Pulmonary exam normal       Cardiovascular hypertension, Normal cardiovascular exam    Neuro/Psych Seizures -, Well Controlled,  Confusion at timesMemory loss TIA   GI/Hepatic Neg liver ROS, GERD-  Medicated and Controlled,  Endo/Other  negative endocrine ROS  Renal/GU negative Renal ROS     Musculoskeletal  (+) Arthritis -, Osteoarthritis,    Abdominal Normal abdominal exam  (+)   Peds negative pediatric ROS (+)  Hematology negative hematology ROS (+)   Anesthesia Other Findings   Reproductive/Obstetrics                             Anesthesia Physical Anesthesia Plan  ASA: III  Anesthesia Plan: General   Post-op Pain Management:    Induction: Intravenous  Airway Management Planned: Nasal Cannula  Additional Equipment:   Intra-op Plan:   Post-operative Plan:   Informed Consent: I have reviewed the patients History and Physical, chart, labs and discussed the procedure including the risks, benefits and alternatives for the proposed anesthesia with the patient or authorized representative who has indicated his/her understanding and acceptance.   Dental advisory given  Plan Discussed with: CRNA and Surgeon  Anesthesia Plan Comments:         Anesthesia Quick Evaluation

## 2015-02-27 NOTE — Anesthesia Postprocedure Evaluation (Signed)
  Anesthesia Post-op Note  Patient: Philip Moreno  Procedure(s) Performed: Procedure(s): COLONOSCOPY WITH PROPOFOL (N/A)  Anesthesia type:General  Patient location: PACU  Post pain: Pain level controlled  Post assessment: Post-op Vital signs reviewed, Patient's Cardiovascular Status Stable, Respiratory Function Stable, Patent Airway and No signs of Nausea or vomiting  Post vital signs: Reviewed and stable  Last Vitals:  Filed Vitals:   02/27/15 1310  BP: 124/82  Pulse: 57  Temp:   Resp: 21    Level of consciousness: awake, alert  and patient cooperative  Complications: No apparent anesthesia complications

## 2015-03-01 ENCOUNTER — Ambulatory Visit
Admission: EM | Admit: 2015-03-01 | Discharge: 2015-03-01 | Disposition: A | Discharge status: Home / Self Care | Payer: Medicare Other | Attending: Internal Medicine | Admitting: Internal Medicine

## 2015-03-01 ENCOUNTER — Encounter: Payer: Self-pay | Admitting: General Surgery

## 2015-03-01 DIAGNOSIS — W57XXXA Bitten or stung by nonvenomous insect and other nonvenomous arthropods, initial encounter: Secondary | ICD-10-CM

## 2015-03-01 DIAGNOSIS — T148 Other injury of unspecified body region: Secondary | ICD-10-CM

## 2015-03-01 DIAGNOSIS — R0982 Postnasal drip: Secondary | ICD-10-CM

## 2015-03-01 MED ORDER — FLUTICASONE PROPIONATE 50 MCG/ACT NA SUSP: 2.0000 | Freq: Every day | NASAL | Status: DC

## 2015-03-01 MED ORDER — LORATADINE 10 MG PO TABS: 10.0000 mg | ORAL_TABLET | Freq: Every day | ORAL | Status: DC

## 2015-03-01 NOTE — Discharge Instructions (Signed)
Claritin (Loratadine)  10 mg  1 a day for post nasal drip- this may also help with itch from bites  Flonase (Fluticasone) nasal spray 1 spray in each nostril once a day  Hydrocortisone 0.1 %  Very small amount on bites as needed for itch  Aveeno or Keri Skin lotion to the area  Caladryl or Benadryl skin lotion for comfort  Please get OFF spray and apply to exposed body area, ankle, neckline.Marland KitchenMarland KitchenPLease be careful not in eyes !   Allergic Rhinitis Allergic rhinitis is when the mucous membranes in the nose respond to allergens. Allergens are particles in the air that cause your body to have an allergic reaction. This causes you to release allergic antibodies. Through a chain of events, these eventually cause you to release histamine into the blood stream. Although meant to protect the body, it is this release of histamine that causes your discomfort, such as frequent sneezing, congestion, and an itchy, runny nose.  CAUSES  Seasonal allergic rhinitis (hay fever) is caused by pollen allergens that may come from grasses, trees, and weeds. Year-round allergic rhinitis (perennial allergic rhinitis) is caused by allergens such as house dust mites, pet dander, and mold spores.  SYMPTOMS   Nasal stuffiness (congestion).  Itchy, runny nose with sneezing and tearing of the eyes. DIAGNOSIS  Your health care provider can help you determine the allergen or allergens that trigger your symptoms. If you and your health care provider are unable to determine the allergen, skin or blood testing may be used. TREATMENT  Allergic rhinitis does not have a cure, but it can be controlled by:  Medicines and allergy shots (immunotherapy).  Avoiding the allergen. Hay fever may often be treated with antihistamines in pill or nasal spray forms. Antihistamines block the effects of histamine. There are over-the-counter medicines that may help with nasal congestion and swelling around the eyes. Check with your health  care provider before taking or giving this medicine.  If avoiding the allergen or the medicine prescribed do not work, there are many new medicines your health care provider can prescribe. Stronger medicine may be used if initial measures are ineffective. Desensitizing injections can be used if medicine and avoidance does not work. Desensitization is when a patient is given ongoing shots until the body becomes less sensitive to the allergen. Make sure you follow up with your health care provider if problems continue. HOME CARE INSTRUCTIONS It is not possible to completely avoid allergens, but you can reduce your symptoms by taking steps to limit your exposure to them. It helps to know exactly what you are allergic to so that you can avoid your specific triggers. SEEK MEDICAL CARE IF:   You have a fever.  You develop a cough that does not stop easily (persistent).  You have shortness of breath.  You start wheezing.  Symptoms interfere with normal daily activities. Document Released: 04/14/2001 Document Revised: 07/25/2013 Document Reviewed: 03/27/2013 [facility] Patient Information 2015 [place], [place]. This information is not intended to replace advice given to you by your health care provider. Make sure you discuss any questions you have with your health care provider.    Insect Bite Mosquitoes, flies, fleas, bedbugs, and many other insects can bite. Insect bites are different from insect stings. A sting is when venom is injected into the skin. Some insect bites can transmit infectious diseases. SYMPTOMS  Insect bites usually turn red, swell, and itch for 2 to 4 days. They often go away on their own. TREATMENT  Your caregiver may prescribe antibiotic medicines if a bacterial infection develops in the bite. HOME CARE INSTRUCTIONS  Do not scratch the bite area.  Keep the bite area clean and dry. Wash the bite area thoroughly with soap and water.  Put ice or cool compresses on the bite  area.  Put ice in a plastic bag.  Place a towel between your skin and the bag.  Leave the ice on for 20 minutes, 4 times a day for the first 2 to 3 days, or as directed.  You may apply a baking soda paste, cortisone cream, or calamine lotion to the bite area as directed by your caregiver. This can help reduce itching and swelling.  Only take over-the-counter or prescription medicines as directed by your caregiver.  If you are given antibiotics, take them as directed. Finish them even if you start to feel better. You may need a tetanus shot if:  You cannot remember when you had your last tetanus shot.  You have never had a tetanus shot.  The injury broke your skin. If you get a tetanus shot, your arm may swell, get red, and feel warm to the touch. This is common and not a problem. If you need a tetanus shot and you choose not to have one, there is a rare chance of getting tetanus. Sickness from tetanus can be serious. SEEK IMMEDIATE MEDICAL CARE IF:   You have increased pain, redness, or swelling in the bite area.  You see a red line on the skin coming from the bite.  You have a fever.  You have joint pain.  You have a headache or neck pain.  You have unusual weakness.  You have a rash.  You have chest pain or shortness of breath.  You have abdominal pain, nausea, or vomiting.  You feel unusually tired or sleepy. MAKE SURE YOU:   Understand these instructions.  Will watch your condition.  Will get help right away if you are not doing well or get worse. Document Released: 08/27/2004 Document Revised: 10/12/2011 Document Reviewed: 02/18/2011 [facility] Patient Information 2015 [place], [place]. This information is not intended to replace advice given to you by your health care provider. Make sure you discuss any questions you have with your health care provider.

## 2015-03-01 NOTE — ED Provider Notes (Signed)
CSN: [id number]     Arrival date & time 03/01/15  1139 History   First MD Initiated Contact with Patient 03/01/15 1311     Chief Complaint  Patient presents with  . Rash   (Consider location/radiation/quality/duration/timing/severity/associated sxs/prior Treatment) HPI  74 yo M has been working in [employer] and now notes "rash" on medial thighs, mid back  No fever or malaise. Report itch.  Also annoyed by post nasal drip , phlegm in his throat and nonproductive repetitive cough-- all exaggerated after a day outside. 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 Colonoscopy a few days ago went well. Hx prostate ca  Past Medical History  Diagnosis Date  . Hypertension   . GERD (gastroesophageal reflux disease)   . Arthritis   . Cancer 05/2011    prostate cancer, [facility]  . Memory loss   . TIA (transient ischemic attack)    Past Surgical History  Procedure Laterality Date  . Robot assisted laparoscopic radical prostatectomy  08/25/2012    Procedure: ROBOTIC ASSISTED LAPAROSCOPIC RADICAL PROSTATECTOMY LEVEL 2;  Surgeon: Dutch Gray, MD;  Location: [facility] ORS;  Service: Urology;  Laterality: N/A;  . Lymphadenectomy  08/25/2012    Procedure: LYMPHADENECTOMY;  Surgeon: Dutch Gray, MD;  Location: [facility] ORS;  Service: Urology;  Laterality: Bilateral;  . Prostate surgery  2012  . Colonoscopy with propofol N/A 02/27/2015    Procedure: COLONOSCOPY WITH PROPOFOL;  Surgeon: Christene Lye, MD;  Location: [facility] ENDOSCOPY;  Service: Endoscopy;  Laterality: N/A;   Family History  Problem Relation Age of Onset  . Hypertension Mother   . Heart attack Father   . Cancer Brother   . Heart failure Brother   . Diabetes Brother    History  Substance Use Topics  . Smoking status: Never Smoker   . Smokeless tobacco: Current User    Types: Snuff, Chew  . Alcohol Use: No    Review of Systems  Constitutional -afebrile Eyes-denies visual changes ENT- normal  voice,denies sore throat, positive post nasal drip  CV-denies chest pain Resp-denies SOB GI- negative for nausea,vomiting, diarrhea GU- negative for dysuria MSK- negative for back pain, ambulatory Skin- see HPI Neuro- negative headache,focal weakness or numbness    Allergies  Review of patient's allergies indicates no known allergies.  Home Medications   Prior to Admission medications   Medication Sig Start Date End Date Taking? Authorizing Provider  amLODipine (NORVASC) 10 MG tablet Take 10 mg by mouth daily.    Historical Provider, MD  fluticasone (FLONASE) 50 MCG/ACT nasal spray Place 2 sprays into both nostrils daily. 03/01/15   Jan Fireman, PA-C  loratadine (CLARITIN) 10 MG tablet Take 1 tablet (10 mg total) by mouth daily. 03/01/15   Jan Fireman, PA-C  LOSARTAN POTASSIUM PO Take by mouth.    Historical Provider, MD  Memantine HCl (NAMENDA PO) Take 1 capsule by mouth daily.     Historical Provider, MD  polyethylene glycol powder (GLYCOLAX/MIRALAX) powder 255 grams one bottle for colonoscopy prep 02/14/15   Seeplaputhur Robinette Haines, MD  QUEtiapine (SEROQUEL) 25 MG tablet Take 25 mg by mouth at bedtime.    Historical Provider, MD   BP 104/73 mmHg  Pulse 76  Temp(Src) 98 F (36.7 C) (Oral)  Resp 16  SpO2 99% Physical Exam    Constitutional -alert and oriented,well appearing and in no acute distress Head-atraumatic, normocephalic Eyes- conjunctiva normal, EOMI ,conjugate gaze Nose- no congestion or rhinorrhea Mouth/throat- mucous membranes  moist ,oropharynx non-erythematous Neck- supple without glandular enlargement CV- regular rate, grossly normal heart sounds,  Resp-no distress, normal respiratory effort,clear to auscultation bilaterally GI- ,no distention GU- not examined MSK- no tender, normal ROM, all extremities, ambulatory, self-care Neuro- normal speech and language, , no gait instability, Skin-multiple individual tiny lesions of medial thighs bilaterally strongly  suggestive of insect bites. 2 new similar findings on low back Psych-mood and affect grossly normal; speech and behavior grossly normal ED Course  Procedures (including critical care time) Labs Review Labs Reviewed - No data to display  Imaging Review No results found.   MDM   1. Insect bites   2. Post-nasal drip    Plan: 1.  Diagnosis reviewed with patient-seasonal allergies suspected, insect bites 2. Rx loratadine, fluticasone ; risks, benefits, potential side effects reviewed with patient/daughter 3. For insect bites : Recommend supportive treatment caladryl/benadryl topical lotions-   hydrocortisone 0.1% lightly applied as needed 4. F/u prn if symptoms worsen or don't improve;  5. Please get OFF spray for use on exposed skin and on clothes when planning to be out in yard      Jan Fireman, PA-C 03/01/15 1431

## 2015-03-01 NOTE — ED Notes (Signed)
Pt states that he has a severe rash scattered throughout his body started yesterday.

## 2015-06-28 ENCOUNTER — Other Ambulatory Visit: Payer: Self-pay | Admitting: Neurology

## 2015-07-11 ENCOUNTER — Telehealth: Payer: Self-pay | Admitting: Neurology

## 2015-07-11 NOTE — Telephone Encounter (Signed)
Wife called to schedule appointment for husband, appointment scheduled for next available, then wife states husband had stroke like symptoms 3 days ago, wife advised to take husband to Hospital ED.

## 2015-07-11 NOTE — Telephone Encounter (Signed)
Patient can be seen at first available appointment in the office. Called and spoke to him. He said he could not get up earlier this week and he was not feeling good in his head. The symptoms resolved. I don't think he needs to go to the ED.  If he has anymore acute symptoms, he can call us immediately and we can advise him what to do whether he needs to go to the ED or not. [employer]

## 2015-07-12 ENCOUNTER — Emergency Department: Payer: Medicare Other

## 2015-07-12 ENCOUNTER — Emergency Department
Admission: EM | Admit: 2015-07-12 | Discharge: 2015-07-12 | Disposition: A | Discharge status: Home / Self Care | Payer: Medicare Other | Attending: Emergency Medicine | Admitting: Emergency Medicine

## 2015-07-12 ENCOUNTER — Encounter: Payer: Self-pay | Admitting: Emergency Medicine

## 2015-07-12 DIAGNOSIS — R55 Syncope and collapse: Secondary | ICD-10-CM | POA: Diagnosis not present

## 2015-07-12 DIAGNOSIS — I10 Essential (primary) hypertension: Secondary | ICD-10-CM | POA: Diagnosis not present

## 2015-07-12 DIAGNOSIS — R42 Dizziness and giddiness: Secondary | ICD-10-CM | POA: Diagnosis present

## 2015-07-12 DIAGNOSIS — I951 Orthostatic hypotension: Secondary | ICD-10-CM

## 2015-07-12 DIAGNOSIS — Z79899 Other long term (current) drug therapy: Secondary | ICD-10-CM | POA: Diagnosis not present

## 2015-07-12 LAB — BASIC METABOLIC PANEL
ANION GAP: 10 (ref 5–15)
BUN: 36 mg/dL — ABNORMAL HIGH (ref 6–20)
CALCIUM: 9 mg/dL (ref 8.9–10.3)
CO2: 23 mmol/L (ref 22–32)
Chloride: 106 mmol/L (ref 101–111)
Creatinine, Ser: 1.8 mg/dL — ABNORMAL HIGH (ref 0.61–1.24)
GFR, EST AFRICAN AMERICAN: 42 mL/min — AB (ref >60)
GFR, EST NON AFRICAN AMERICAN: 36 mL/min — AB (ref >60)
Glucose, Bld: 134 mg/dL — ABNORMAL HIGH (ref 65–99)
Potassium: 4 mmol/L (ref 3.5–5.1)
Sodium: 139 mmol/L (ref 135–145)

## 2015-07-12 LAB — CBC
HCT: 42 % (ref 40.0–52.0)
HEMOGLOBIN: 13.4 g/dL (ref 13.0–18.0)
MCH: 25.8 pg — ABNORMAL LOW (ref 26.0–34.0)
MCHC: 31.8 g/dL — ABNORMAL LOW (ref 32.0–36.0)
MCV: 81.2 fL (ref 80.0–100.0)
Platelets: 221 10*3/uL (ref 150–440)
RBC: 5.18 MIL/uL (ref 4.40–5.90)
RDW: 13.4 % (ref 11.5–14.5)
WBC: 10.2 10*3/uL (ref 3.8–10.6)

## 2015-07-12 LAB — TROPONIN I

## 2015-07-12 MED ORDER — MECLIZINE HCL 25 MG PO TABS: 25.0000 mg | ORAL_TABLET | Freq: Once | ORAL | Status: DC

## 2015-07-12 MED ORDER — SODIUM CHLORIDE 0.9 % IV BOLUS (SEPSIS)
1000.0000 mL | Freq: Once | INTRAVENOUS | Status: AC
Start: 2015-07-12 — End: 2015-07-12
  Administered 2015-07-12: 1000 mL via INTRAVENOUS

## 2015-07-12 NOTE — Telephone Encounter (Signed)
Called patient. Spoke to spouse. Gave instructions per Dr. Cathren Laine previous note.  She was in route to the ED with patient. Advised her to c/b to schedule f/u appt w/ NP-[place] as instructed, if needed. Spouse agreed.

## 2015-07-12 NOTE — ED Provider Notes (Signed)
[facility] Emergency Department Provider Note  ____________________________________________  Time seen: Approximately 315 PM  I have reviewed the triage vital signs and the nursing notes.   HISTORY  Chief Complaint Dizziness    HPI Philip Moreno is a 74 y.o. male with a history of hypertension and TIA who is presenting today with 2 weeks of dizziness when standing. The patient says that he is not dizzy at this time but does say that he has intermittent episodes where he feels the room is spinning. It is especially bad when he stands up. However, once he is up the symptoms slowly dissipate over time. He denies any weakness or numbness. Denies any pain. No shortness of breath. Denies any vomiting. Says that he has not had any issues with eating or drinking. No changes to his medication recently.Also denies any recent weight loss. Denies any ringing, pressure to his ears bilaterally.   Past Medical History  Diagnosis Date  . Hypertension   . GERD (gastroesophageal reflux disease)   . Arthritis   . Cancer [facility]) 05/2011    prostate cancer, [facility]  . Memory loss   . TIA (transient ischemic attack)     Patient Active Problem List   Diagnosis Date Noted  . Other specified transient cerebral ischemias 05/02/2014  . Pre-syncope 04/24/2014  . 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 Arthritis 04/24/2014  . BP (high blood pressure) 04/24/2014  . Confusion state 02/28/2014  . Dizziness 02/28/2014  . Convulsions ([place]) 02/28/2014    Past Surgical History  Procedure Laterality Date  . Robot assisted laparoscopic radical prostatectomy  08/25/2012    Procedure: ROBOTIC ASSISTED LAPAROSCOPIC RADICAL PROSTATECTOMY LEVEL 2;  Surgeon: Dutch Gray, MD;  Location: [facility];  Service: Urology;  Laterality: N/A;  . Lymphadenectomy  08/25/2012    Procedure: LYMPHADENECTOMY;  Surgeon: Dutch Gray, MD;  Location: [facility]S;  Service: Urology;  Laterality:  Bilateral;  . Prostate surgery  2012  . Colonoscopy with propofol N/A 02/27/2015    Procedure: COLONOSCOPY WITH PROPOFOL;  Surgeon: Christene Lye, MD;  Location: [facility] ENDOSCOPY;  Service: Endoscopy;  Laterality: N/A;    Current Outpatient Rx  Name  Route  Sig  Dispense  Refill  . amLODipine (NORVASC) 10 MG tablet   Oral   Take 10 mg by mouth daily.         . fluticasone (FLONASE) 50 MCG/ACT nasal spray   Each Nare   Place 2 sprays into both nostrils daily.   16 g   2   . loratadine (CLARITIN) 10 MG tablet   Oral   Take 1 tablet (10 mg total) by mouth daily.   30 tablet   0   . LOSARTAN POTASSIUM PO   Oral   Take by mouth.         . Memantine HCl (NAMENDA PO)   Oral   Take 1 capsule by mouth daily.          . polyethylene glycol powder (GLYCOLAX/MIRALAX) powder      255 grams one bottle for colonoscopy prep   255 g   0   . QUEtiapine (SEROQUEL) 25 MG tablet   Oral   Take 25 mg by mouth at bedtime.         Marland Kitchen QUEtiapine (SEROQUEL) 25 MG tablet      START WITH 1 TABLET BY MOUTH TWICE DAILY, AND MAY INCREASE TO 2 TABLETS TWICE DAILY AS DIRECTED   60 tablet  0     Please ask the patient to contact our office and s ...     Allergies Review of patient's allergies indicates no known allergies.  Family History  Problem Relation Age of Onset  . Hypertension Mother   . Heart attack Father   . Cancer Brother   . Heart failure Brother   . Diabetes Brother     Social History Social History  Substance Use Topics  . Smoking status: Never Smoker   . Smokeless tobacco: Current User    Types: Snuff, Chew  . Alcohol Use: No    Review of Systems Constitutional: No fever/chills Eyes: No visual changes. ENT: No sore throat. Cardiovascular: Denies chest pain. Respiratory: Denies shortness of breath. Gastrointestinal: No abdominal pain.  No nausea, no vomiting.  No diarrhea.  No constipation. Genitourinary: Negative for dysuria. Musculoskeletal:  Negative for back pain. Skin: Negative for rash. Neurological: Negative for headaches, focal weakness or numbness.  10-point ROS otherwise negative.  ____________________________________________   PHYSICAL EXAM:  VITAL SIGNS: ED Triage Vitals  Enc Vitals Group     BP 07/12/15 1241 109/71 mmHg     Pulse Rate 07/12/15 1241 104     Resp 07/12/15 1241 20     Temp --      Temp src --      SpO2 07/12/15 1241 100 %     Weight 07/12/15 1241 210 lb (95.255 kg)     Height 07/12/15 1241 6\' 3"  (1.905 m)     Head Cir --      Peak Flow --      Pain Score 07/12/15 1454 0     Pain Loc --      Pain Edu? --      Excl. in [place]? --     Constitutional: Alert and oriented. Well appearing and in no acute distress. Eyes: Conjunctivae are normal. PERRL. EOMI. Head: Atraumatic. Nose: No congestion/rhinnorhea. Mouth/Throat: Mucous membranes are moist.  Oropharynx non-erythematous. Neck: No stridor.   Cardiovascular: Normal rate, regular rhythm. Grossly normal heart sounds.  Good peripheral circulation. Respiratory: Normal respiratory effort.  No retractions. Lungs CTAB. Gastrointestinal: Soft and nontender. No distention. No abdominal bruits. No CVA tenderness. Musculoskeletal: No lower extremity tenderness nor edema.  No joint effusions. Neurologic:  Normal speech and language. No gross focal neurologic deficits are appreciated. No gait instability. No nystagmus. No ataxia on finger to nose testing. Walks with a normal gait. However, wanted to stand him he said that the dizziness symptoms are provoked. Skin:  Skin is warm, dry and intact. No rash noted. Psychiatric: Mood and affect are normal. Speech and behavior are normal.  ____________________________________________   LABS (all labs ordered are listed, but only abnormal results are displayed)  Labs Reviewed  BASIC METABOLIC PANEL - Abnormal; Notable for the following:    Glucose, Bld 134 (*)    BUN 36 (*)    Creatinine, Ser 1.80 (*)     GFR calc non Af Amer 36 (*)    GFR calc Af Amer 42 (*)    All other components within normal limits  CBC - Abnormal; Notable for the following:    MCH 25.8 (*)    MCHC 31.8 (*)    All other components within normal limits  TROPONIN I  URINALYSIS COMPLETEWITH MICROSCOPIC ([facility] ONLY)   ____________________________________________  EKG  ED ECG REPORT I, Doran Stabler, the attending physician, personally viewed and interpreted this ECG.   Date: 07/12/2015  EKG Time:  1244  Rate: 98  Rhythm: normal sinus rhythm  Axis: Left axis deviation  Intervals:none  ST&T Change: No ST segment elevation or depression. No abnormal T-wave inversion.  ____________________________________________  RADIOLOGY  CT of the brain with atrophy and mild patchy periventricular small vessel disease. No intracranial mass, hemorrhage or acute appearing infarct. ____________________________________________   PROCEDURES  Patient with positive orthostatic vital signs and is symptomatic with orthostatic changes.  ____________________________________________   INITIAL IMPRESSION / ASSESSMENT AND PLAN / ED COURSE  Pertinent labs & imaging results that were available during my care of the patient were reviewed by me and considered in my medical decision making (see chart for details).  ----------------------------------------- 4:45 PM on 07/12/2015 -----------------------------------------  patient feeling much improved after fluids. Able to get up and stand without any dizziness.no longer symptomatic.  ----------------------------------------- 4:50 PM on 07/12/2015 -----------------------------------------  Discussed the case with the patient's primary care doctor, Dr. Brynda Greathouse who agrees with the plan to DC the patient's amlodipine. Dr. Brynda Greathouse says that he will see the patient on Monday. He is says that the patient is fine to show up at the office at 9 AM for follow-up. I also encouraged the  patient to drink plenty of fluids and he is his wife understand this plan and are willing to comply. He'll be discharged home.   ____________________________________________   FINAL CLINICAL IMPRESSION(S) / ED DIAGNOSES  Near-syncope. Orthostatic hypotension.    Orbie Pyo, MD 07/12/15 [id number]

## 2015-07-12 NOTE — ED Notes (Signed)
Patient able to stand without dizziness.

## 2015-07-12 NOTE — Telephone Encounter (Signed)
Larey Seat - make an appointment with patient next week for 30 minutes with Philip Moreno first available. He should also see his primary care as this may not be neurologic and he should be evaluated for other causes. If it gets bad over the weekend he should call us or go to the ED. thanks

## 2015-07-12 NOTE — Telephone Encounter (Signed)
Wife called back, states husband is staggering when he walks, is complaining that "something is going on in his head, makes him feel like he's going to fall out".

## 2015-07-12 NOTE — ED Notes (Signed)
Patient transported to CT 

## 2015-07-12 NOTE — ED Notes (Signed)
Per family dizziness for about 2 weeks  Denies any n/v/ or fever denies headache

## 2015-07-12 NOTE — Telephone Encounter (Signed)
Pt's wife called back, said husband could not relay conversation he had with [name] yesterday. I relayed msg to her, she understood. She said he was up at 4:00 am today with nosebleed for 1 hr with some big clots...looked like jellied blood. She said he went back to bed about 5:15 and has not awaken yet, he is an early riser. Please call wife and advise.

## 2015-07-15 NOTE — Telephone Encounter (Signed)
Appt schedule with [name] in January 2017.

## 2015-08-13 ENCOUNTER — Ambulatory Visit (INDEPENDENT_AMBULATORY_CARE_PROVIDER_SITE_OTHER): Payer: Medicare Other | Admitting: Neurology

## 2015-08-13 ENCOUNTER — Encounter: Payer: Self-pay | Admitting: Neurology

## 2015-08-13 VITALS — BP 141/92 | HR 77 | Wt 218.2 lb

## 2015-08-13 DIAGNOSIS — F0391 Unspecified dementia with behavioral disturbance: Secondary | ICD-10-CM

## 2015-08-13 DIAGNOSIS — I951 Orthostatic hypotension: Secondary | ICD-10-CM | POA: Diagnosis not present

## 2015-08-13 DIAGNOSIS — R42 Dizziness and giddiness: Secondary | ICD-10-CM

## 2015-08-13 DIAGNOSIS — F03918 Unspecified dementia, unspecified severity, with other behavioral disturbance: Secondary | ICD-10-CM

## 2015-08-13 MED ORDER — QUETIAPINE FUMARATE 25 MG PO TABS: 50.0000 mg | ORAL_TABLET | Freq: Every day | ORAL | Status: DC

## 2015-08-13 MED ORDER — MEMANTINE HCL ER 28 MG PO CP24: 28.0000 mg | ORAL_CAPSULE | Freq: Every day | ORAL | Status: DC

## 2015-08-13 MED ORDER — DONEPEZIL HCL 5 MG PO TABS: 5.0000 mg | ORAL_TABLET | Freq: Every day | ORAL | Status: DC

## 2015-08-13 NOTE — Progress Notes (Signed)
[id number]    Provider:  [name] Referring Provider: [name], MD Primary Care Physician:  [name], MD  CC: Philip Moreno is a 75 y.o. male here as a consult from Dr. Jaquelyn Bitter B for Dizziness, memory loss and agitation, spells  Interval history 08/14/2015:   The dizziness is every day. Hasn't gotten any better and thought to be due to orthostatic hypotension. He was stopped on his 2 other BP medications. He was started on Terazosin 5mg  instead. He does not feel the room spinning, more lightheaded. He is not feeling any better. He is also having nose bleeds.  He had a nose bleed for 2 hours and he slumped over and he was not responsive for a minute, foam coming out of his mouth. They called the paramedics and he went to the ED. BP was 80/60. His nose was cauterized. He was on the terazosin at the time buthe was not hydrating well and he has a significant nose bleed for 2 hours.. She stopped the medication and Thursday his blood pressure was extremely high and now he is back on the Terazosin and he has a follow up with urology. Memory stable.   Personally reviewed images of the brain MRI that showed multiple, small strokes of uncertain age in the bilateral cerebellum, moderate atrophy of the brain.  Normal awake EEG(x2). No significant stenosis of carotid dopplers, cardiology exam and ECG suggests non cardiac etiology but suggested echo, MRA and implantable loop recorder but they declined. B12, TSH WNL.   CT head 05/2015: Mild diffuse atrophy is stable. There is no intracranial mass, hemorrhage, extra-axial fluid collection, or midline shift. There is mild patchy small vessel disease in the centra semiovale bilaterally. There is no new gray-white compartment lesion. No acute infarct evident. The bony calvarium appears intact. The mastoid air cells are clear. No intraorbital lesions are identified in the visualized orbital regions.  IMPRESSION: Atrophy with mild  patchy periventricular small vessel disease. No intracranial mass, hemorrhage, or acute appearing infarct.  MRI brain 05/2014:  IMPRESSION:  Abnormal MRI brain (without) demonstrating: 1. Moderate diffuse and moderate-severe peri-sylvian atrophy.  2. 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  3. 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  4. No acute findings.    HPI: Philip Moreno is a 75 y.o. male here as a consult from Dr. Jaquelyn Bitter B for Dizziness, memory loss and agitation, spells  75 year old male here for imbalance, Dizziness, memory loss and agitation, spells. Wife accompanies patient and she provides most of the information regarding symptoms due to patient's cognitive dysfunction. Dizziness persists, has had it multiple times since last appointment. 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 Here to review images of the brain and discuss next steps. He reports he feels "crazy in his head right now" and is having one of his episodes as he sits in the chair, ausculation of his heart demonstrates RRR during the episode that lasts several minutes. Says the room is not spinning. He can't get the eyes to focus. No feeling of passing out. He can't describe it, there is something in his head and he needs to be still for a moment. Denies dizziness. Denies lightheadedness. Denies CP.   He had an episode where he was in the bathroom on the toilet. Wife found him on the floor. He was sweating, told  wife he couldn't breath and then he slumped over like a rag doll and he was in space staring. Lasted 3-4 seconds, then he raised his head up and he gasped like he was through an ordeal, knew where he was, who is wife was, had urinated on the floor next to the toilet. No abnormal shaking movements, no biting of the  tongue but tongue was sticking out of the mouth. He was making groaning noises   Reviewed notes, labs and imaging from outside physicians, which showed: Personally reviewed images of the brain MRI that showed multiple, small strokes of uncertain age in the bilateral cerebellum, moderate atrophy of the brain.  Normal awake EEG. No significant stenosis of carotid dopplers, cardiology exam and ECG suggests non cardiac etiology. B12, TSH WNL.    Review of Systems: Patient complains of symptoms per HPI as well as the following symptoms blurred vision, cough, chest tightness, insomnia, frequency of urination, testicular pain, back pain, memory loss, dizziness, passing out, agitation. Pertinent negatives per HPI. All others negative.  Social History   Social History  . Marital Status: Married    Spouse Name: Jamelle Haring   . Number of Children: 0  . Years of Education: 7   Occupational History  . farmer     Social History Main Topics  . Smoking status: Never Smoker   . Smokeless tobacco: Current User    Types: Snuff, Chew  . Alcohol Use: No  . Drug Use: No  . Sexual Activity: Not on file   Other Topics Concern  . Not on file   Social History Narrative   Patient lives at home.    Patient is a part time farmer    Patient has a 7th grade education.    Patient has no children.     Family History  Problem Relation Age of Onset  . Hypertension Mother   . Heart attack Father   . Cancer Brother   . Heart failure Brother   . Diabetes Brother     Past Medical History  Diagnosis Date  . Hypertension   . GERD (gastroesophageal reflux disease)   . Arthritis   . Cancer [facility]) 05/2011    prostate cancer, [facility]  . Memory loss   . TIA (transient ischemic attack)     Past Surgical History  Procedure Laterality Date  . Robot assisted laparoscopic radical prostatectomy  08/25/2012    Procedure: ROBOTIC ASSISTED LAPAROSCOPIC RADICAL PROSTATECTOMY LEVEL 2;  Surgeon: Dutch Gray, MD;   Location: [facility];  Service: Urology;  Laterality: N/A;  . Lymphadenectomy  08/25/2012    Procedure: LYMPHADENECTOMY;  Surgeon: Dutch Gray, MD;  Location: [facility];  Service: Urology;  Laterality: Bilateral;  . Prostate surgery  2012  . Colonoscopy with propofol N/A 02/27/2015    Procedure: COLONOSCOPY WITH PROPOFOL;  Surgeon: Christene Lye, MD;  Location: [facility] ENDOSCOPY;  Service: Endoscopy;  Laterality: N/A;    Current Outpatient Prescriptions  Medication Sig Dispense Refill  . donepezil (ARICEPT) 5 MG tablet Take 5 mg by mouth daily.    . fluticasone (FLONASE) 50 MCG/ACT nasal spray Place 2 sprays into both nostrils daily. 16 g 2  . Memantine HCl (NAMENDA PO) Take 1 capsule by mouth daily.     . QUEtiapine (SEROQUEL) 25 MG tablet Take 25 mg by mouth at bedtime. Reported on 08/13/2015    . QUEtiapine (SEROQUEL) 25 MG tablet START WITH 1 TABLET BY MOUTH TWICE DAILY, AND MAY INCREASE TO 2 TABLETS TWICE  DAILY AS DIRECTED 60 tablet 0   No current facility-administered medications for this visit.    Allergies as of 08/13/2015  . (No Known Allergies)    Vitals: BP 141/92 mmHg  Pulse 77  Wt 218 lb 3.2 oz (98.975 kg) Last Weight:  Wt Readings from Last 1 Encounters:  08/13/15 218 lb 3.2 oz (98.975 kg)   Last Height:   Ht Readings from Last 1 Encounters:  07/12/15 6\' 3"  (1.905 m)       Physical exam:  Exam:  Gen: NAD, conversant, well nourised, well groomed. He becomes mildly agitated.  CV: RRR, no MRG. No Carotid Bruits. No peripheral edema, warm, nontender  Eyes: Conjunctivae clear without exudates or hemorrhage  Neuro: NO CHANGE Detailed Neurologic Exam  Speech:  Speech is normal; fluent and spontaneous with normal comprehension.  Cognition:  The patient is oriented to person recent memory is impaired; remote memory is impaired language fluent;  Impaired attention, concentration,  Impaired fund of knowledge for education  He is surprisingly insightful  regarding his memory loss and agitation   Montreal Cognitive Assessment  05/14/2014  Visuospatial/ Executive (0/5) 1  Naming (0/3) 3  Attention: Read list of digits (0/2) 1  Attention: Read list of letters (0/1) 1  Attention: Serial 7 subtraction starting at 100 (0/3) 0  Language: Repeat phrase (0/2) 1  Language : Fluency (0/1) 0  Abstraction (0/2) 0  Delayed Recall (0/5) 1  Orientation (0/6) 5  Total 13  Adjusted Score (based on education) 14    Cranial Nerves:  The pupils are equal, round, and reactive to light. Visual fields are full to finger confrontation. No nystagmus. Extraocular movements are intact. Trigeminal sensation is intact and the muscles of mastication are normal. The face is symmetric. The palate elevates in the midline. Voice is normal.   Motor Observation:  No asymmetry, no atrophy, and no involuntary movements noted.  Tone:  Normal muscle tone.  Posture:  Posture is normal. normal erect  Strength:  Strength is V/V in the upper and lower limbs. Mild right pronator drift.  .   Assessment/Plan: 75 year old male with a PMHx of cognitive impairment who is here for daily dizziness, he is orthostatic in the office today but does not complain of dizziness today. . Neuro exam significant for mild agitation and impaired recent memory. Patient is a poor historian and wife provides most of the information. His pcp is changing his blood pressure medications. Dizziness likely due to orthostatic hypotension, and decreased intake of fluids.    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  Continue asa 81mg  for stroke prevention, follow with primary care for management of vascular risk factors    Sarina Ill, MD  [facility] [address] [place], [place] [address]  Phone [phone] Fax [phone]  A total of 30 minutes was spent face-to-face with this patient. Over half this time was spent on counseling patient on the stroke, dizziness, orthostatic hypotension diagnosis and different diagnostic and therapeutic options available.

## 2015-10-17 ENCOUNTER — Emergency Department: Payer: Medicare Other

## 2015-10-17 ENCOUNTER — Encounter: Payer: Self-pay | Admitting: Emergency Medicine

## 2015-10-17 ENCOUNTER — Emergency Department
Admission: EM | Admit: 2015-10-17 | Discharge: 2015-10-17 | Disposition: A | Discharge status: Home / Self Care | Payer: Medicare Other | Attending: Emergency Medicine | Admitting: Emergency Medicine

## 2015-10-17 DIAGNOSIS — I159 Secondary hypertension, unspecified: Secondary | ICD-10-CM | POA: Diagnosis not present

## 2015-10-17 DIAGNOSIS — Z79899 Other long term (current) drug therapy: Secondary | ICD-10-CM | POA: Insufficient documentation

## 2015-10-17 DIAGNOSIS — Z7951 Long term (current) use of inhaled steroids: Secondary | ICD-10-CM | POA: Diagnosis not present

## 2015-10-17 DIAGNOSIS — I10 Essential (primary) hypertension: Secondary | ICD-10-CM | POA: Diagnosis present

## 2015-10-17 DIAGNOSIS — R51 Headache: Secondary | ICD-10-CM | POA: Diagnosis not present

## 2015-10-17 NOTE — ED Notes (Signed)
Patient states "My pressure is up and I can't get it down".  Onset of symptoms yesterday.  Denies CP or SOB.  Patient states he checks his BP at home.  This morning BP was 177/104.

## 2015-10-17 NOTE — Discharge Instructions (Signed)
Please seek medical attention for any high fevers, chest pain, shortness of breath, change in behavior, persistent vomiting, bloody stool or any other new or concerning symptoms. ° ° °Hypertension °Hypertension is another name for high blood pressure. High blood pressure forces your heart to work harder to pump blood. A blood pressure reading has two numbers, which includes a higher number over a lower number (example: 110/72). °HOME CARE  °· Have your blood pressure rechecked by your doctor. °· Only take medicine as told by your doctor. Follow the directions carefully. The medicine does not work as well if you skip doses. Skipping doses also puts you at risk for problems. °· Do not smoke. °· Monitor your blood pressure at home as told by your doctor. °GET HELP IF: °· You think you are having a reaction to the medicine you are taking. °· You have repeat headaches or feel dizzy. °· You have puffiness (swelling) in your ankles. °· You have trouble with your vision. °GET HELP RIGHT AWAY IF:  °· You get a very bad headache and are confused. °· You feel weak, numb, or faint. °· You get chest or belly (abdominal) pain. °· You throw up (vomit). °· You cannot breathe very well. °MAKE SURE YOU:  °· Understand these instructions. °· Will watch your condition. °· Will get help right away if you are not doing well or get worse. °  °This information is not intended to replace advice given to you by your health care provider. Make sure you discuss any questions you have with your health care provider. °  °Document Released: 01/06/2008 Document Revised: 07/25/2013 Document Reviewed: 05/12/2013 °Elsevier Interactive Patient Education ©2016 [employer] ° °

## 2015-10-17 NOTE — ED Provider Notes (Signed)
[facility] Emergency Department Provider Note   ____________________________________________  Time seen: ~1655  I have reviewed the triage vital signs and the nursing notes.   HISTORY  Chief Complaint Hypertension   History limited by: Not Limited   HPI Philip Moreno is a 75 y.o. male who presented to the emergency department today because of concern for hypertension. The patient states that it has been high for the past couple of days. The patient states he has been taking his medication without any significant change in his blood pressure. He has had an associated headache with the elevated blood pressure. It is located primarily on the right side. It has been intermittent.    Past Medical History  Diagnosis Date  . Hypertension   . GERD (gastroesophageal reflux disease)   . Arthritis   . Cancer [facility]) 05/2011    prostate cancer, [facility]  . Memory loss   . TIA (transient ischemic attack)     Patient Active Problem List   Diagnosis Date Noted  . Other specified transient cerebral ischemias 05/02/2014  . Pre-syncope 04/24/2014  . 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 Arthritis 04/24/2014  . BP (high blood pressure) 04/24/2014  . Confusion state 02/28/2014  . Dizziness 02/28/2014  . Convulsions ([place]) 02/28/2014    Past Surgical History  Procedure Laterality Date  . Robot assisted laparoscopic radical prostatectomy  08/25/2012    Procedure: ROBOTIC ASSISTED LAPAROSCOPIC RADICAL PROSTATECTOMY LEVEL 2;  Surgeon: Dutch Gray, MD;  Location: [facility] ORS;  Service: Urology;  Laterality: N/A;  . Lymphadenectomy  08/25/2012    Procedure: LYMPHADENECTOMY;  Surgeon: Dutch Gray, MD;  Location: [facility] ORS;  Service: Urology;  Laterality: Bilateral;  . Prostate surgery  2012  . Colonoscopy with propofol N/A 02/27/2015    Procedure: COLONOSCOPY WITH PROPOFOL;  Surgeon: Christene Lye, MD;  Location: [facility] ENDOSCOPY;  Service: Endoscopy;   Laterality: N/A;    Current Outpatient Rx  Name  Route  Sig  Dispense  Refill  . donepezil (ARICEPT) 5 MG tablet   Oral   Take 1 tablet (5 mg total) by mouth daily.   30 tablet   11   . fluticasone (FLONASE) 50 MCG/ACT nasal spray   Each Nare   Place 2 sprays into both nostrils daily.   16 g   2   . memantine (NAMENDA XR) 28 MG CP24 24 hr capsule   Oral   Take 1 capsule (28 mg total) by mouth daily.   30 capsule   11   . QUEtiapine (SEROQUEL) 25 MG tablet      START WITH 1 TABLET BY MOUTH TWICE DAILY, AND MAY INCREASE TO 2 TABLETS TWICE DAILY AS DIRECTED   60 tablet   0     Please ask the patient to contact our office and s ...   . QUEtiapine (SEROQUEL) 25 MG tablet   Oral   Take 2 tablets (50 mg total) by mouth at bedtime. Reported on 08/13/2015   60 tablet   11     Allergies Review of patient's allergies indicates no known allergies.  Family History  Problem Relation Age of Onset  . Hypertension Mother   . Heart attack Father   . Cancer Brother   . Heart failure Brother   . Diabetes Brother     Social History Social History  Substance Use Topics  . Smoking status: Never Smoker   . Smokeless tobacco: Current User    Types: Snuff, Chew  .  Alcohol Use: No    Review of Systems  Constitutional: Negative for fever. Cardiovascular: Negative for chest pain. Respiratory: Negative for shortness of breath. Gastrointestinal: Negative for abdominal pain, vomiting and diarrhea. Neurological: Positive for headache  10-point ROS otherwise negative.  ____________________________________________   PHYSICAL EXAM:  VITAL SIGNS: ED Triage Vitals  Enc Vitals Group     BP 10/17/15 1505 159/94 mmHg     Pulse Rate 10/17/15 1501 96     Resp 10/17/15 1501 18     Temp 10/17/15 1501 97.3 F (36.3 C)     Temp Source 10/17/15 1501 Oral     SpO2 10/17/15 1501 97 %     Weight 10/17/15 1501 223 lb (101.152 kg)     Height 10/17/15 1501 6\' 3"  (1.905 m)     Head  Cir --      Peak Flow --      Pain Score 10/17/15 1504 0   Constitutional: Alert and oriented. Well appearing and in no distress. Eyes: Conjunctivae are normal. PERRL. Normal extraocular movements. ENT   Head: Normocephalic and atraumatic.   Nose: No congestion/rhinnorhea.   Mouth/Throat: Mucous membranes are moist.   Neck: No stridor. Hematological/Lymphatic/Immunilogical: No cervical lymphadenopathy. Cardiovascular: Normal rate, regular rhythm.  No murmurs, rubs, or gallops. Respiratory: Normal respiratory effort without tachypnea nor retractions. Breath sounds are clear and equal bilaterally. No wheezes/rales/rhonchi. Gastrointestinal: Soft and nontender. No distention. There is no CVA tenderness. Genitourinary: Deferred Musculoskeletal: Normal range of motion in all extremities. No joint effusions.  No lower extremity tenderness nor edema. Neurologic:  Normal speech and language. No gross focal neurologic deficits are appreciated.  Skin:  Skin is warm, dry and intact. No rash noted. Psychiatric: Mood and affect are normal. Speech and behavior are normal. Patient exhibits appropriate insight and judgment.  ____________________________________________    LABS (pertinent positives/negatives)  None  ____________________________________________   EKG  None  ____________________________________________    RADIOLOGY  CT head IMPRESSION: 1. Atrophy and small vessel disease. 2. No evidence for acute intracranial abnormality. 3. Chronic ischemic change in the left thalamus. 4. Sclerotic lesion in the right temporal bone. Consider metastatic lesion versus benign sclerotic process.  ____________________________________________   PROCEDURES  Procedure(s) performed: None  Critical Care performed: No  ____________________________________________   INITIAL IMPRESSION / ASSESSMENT AND PLAN / ED COURSE  Pertinent labs & imaging results that were available  during my care of the patient were reviewed by me and considered in my medical decision making (see chart for details).  Patient presented to the emergency department today because of concerns for high blood pressure. Patient denies any chest pain. Patient was complaining of a mild headache so a head CT was ordered which did not show any acute change. Will discharge home and have patient follow-up with primary care.  ____________________________________________   FINAL CLINICAL IMPRESSION(S) / ED DIAGNOSES  Final diagnoses:  Secondary hypertension, unspecified     Nance Pear, MD 10/17/15 2023

## 2015-12-20 ENCOUNTER — Observation Stay: Payer: Medicare Other

## 2015-12-20 ENCOUNTER — Observation Stay
Admission: EM | Admit: 2015-12-20 | Discharge: 2015-12-20 | Payer: Medicare Other | Attending: Internal Medicine | Admitting: Internal Medicine

## 2015-12-20 ENCOUNTER — Emergency Department: Payer: Medicare Other

## 2015-12-20 ENCOUNTER — Inpatient Hospital Stay (HOSPITAL_COMMUNITY)
Admission: AD | Admit: 2015-12-20 | Discharge: 2015-12-24 | DRG: 065 | Disposition: A | Discharge status: Skilled Nursing Facility | Payer: Medicare Other | Source: Other Acute Inpatient Hospital | Attending: Family Medicine | Admitting: Family Medicine

## 2015-12-20 ENCOUNTER — Encounter: Payer: Self-pay | Admitting: Emergency Medicine

## 2015-12-20 DIAGNOSIS — Z809 Family history of malignant neoplasm, unspecified: Secondary | ICD-10-CM | POA: Insufficient documentation

## 2015-12-20 DIAGNOSIS — I63413 Cerebral infarction due to embolism of bilateral middle cerebral arteries: Secondary | ICD-10-CM | POA: Diagnosis not present

## 2015-12-20 DIAGNOSIS — E860 Dehydration: Secondary | ICD-10-CM | POA: Diagnosis present

## 2015-12-20 DIAGNOSIS — F0390 Unspecified dementia without behavioral disturbance: Secondary | ICD-10-CM | POA: Insufficient documentation

## 2015-12-20 DIAGNOSIS — G40909 Epilepsy, unspecified, not intractable, without status epilepticus: Secondary | ICD-10-CM | POA: Diagnosis not present

## 2015-12-20 DIAGNOSIS — M109 Gout, unspecified: Secondary | ICD-10-CM | POA: Diagnosis present

## 2015-12-20 DIAGNOSIS — I130 Hypertensive heart and chronic kidney disease with heart failure and stage 1 through stage 4 chronic kidney disease, or unspecified chronic kidney disease: Secondary | ICD-10-CM | POA: Diagnosis not present

## 2015-12-20 DIAGNOSIS — Z7982 Long term (current) use of aspirin: Secondary | ICD-10-CM | POA: Diagnosis not present

## 2015-12-20 DIAGNOSIS — E1122 Type 2 diabetes mellitus with diabetic chronic kidney disease: Secondary | ICD-10-CM | POA: Diagnosis not present

## 2015-12-20 DIAGNOSIS — S0001XA Abrasion of scalp, initial encounter: Secondary | ICD-10-CM | POA: Diagnosis not present

## 2015-12-20 DIAGNOSIS — K219 Gastro-esophageal reflux disease without esophagitis: Secondary | ICD-10-CM | POA: Diagnosis present

## 2015-12-20 DIAGNOSIS — F0151 Vascular dementia with behavioral disturbance: Secondary | ICD-10-CM | POA: Insufficient documentation

## 2015-12-20 DIAGNOSIS — D62 Acute posthemorrhagic anemia: Secondary | ICD-10-CM | POA: Diagnosis present

## 2015-12-20 DIAGNOSIS — I639 Cerebral infarction, unspecified: Secondary | ICD-10-CM | POA: Diagnosis present

## 2015-12-20 DIAGNOSIS — Z8546 Personal history of malignant neoplasm of prostate: Secondary | ICD-10-CM

## 2015-12-20 DIAGNOSIS — N183 Chronic kidney disease, stage 3 unspecified: Secondary | ICD-10-CM | POA: Diagnosis present

## 2015-12-20 DIAGNOSIS — R509 Fever, unspecified: Secondary | ICD-10-CM | POA: Diagnosis not present

## 2015-12-20 DIAGNOSIS — Z833 Family history of diabetes mellitus: Secondary | ICD-10-CM | POA: Insufficient documentation

## 2015-12-20 DIAGNOSIS — I6340 Cerebral infarction due to embolism of unspecified cerebral artery: Secondary | ICD-10-CM | POA: Diagnosis present

## 2015-12-20 DIAGNOSIS — Z8673 Personal history of transient ischemic attack (TIA), and cerebral infarction without residual deficits: Secondary | ICD-10-CM | POA: Diagnosis present

## 2015-12-20 DIAGNOSIS — R413 Other amnesia: Secondary | ICD-10-CM | POA: Diagnosis not present

## 2015-12-20 DIAGNOSIS — R42 Dizziness and giddiness: Secondary | ICD-10-CM | POA: Diagnosis not present

## 2015-12-20 DIAGNOSIS — Z720 Tobacco use: Secondary | ICD-10-CM | POA: Diagnosis not present

## 2015-12-20 DIAGNOSIS — Z79899 Other long term (current) drug therapy: Secondary | ICD-10-CM | POA: Diagnosis not present

## 2015-12-20 DIAGNOSIS — I509 Heart failure, unspecified: Secondary | ICD-10-CM | POA: Insufficient documentation

## 2015-12-20 DIAGNOSIS — I6350 Cerebral infarction due to unspecified occlusion or stenosis of unspecified cerebral artery: Secondary | ICD-10-CM | POA: Diagnosis not present

## 2015-12-20 DIAGNOSIS — D72829 Elevated white blood cell count, unspecified: Secondary | ICD-10-CM | POA: Insufficient documentation

## 2015-12-20 DIAGNOSIS — M1990 Unspecified osteoarthritis, unspecified site: Secondary | ICD-10-CM | POA: Diagnosis present

## 2015-12-20 DIAGNOSIS — I129 Hypertensive chronic kidney disease with stage 1 through stage 4 chronic kidney disease, or unspecified chronic kidney disease: Secondary | ICD-10-CM | POA: Diagnosis present

## 2015-12-20 DIAGNOSIS — M1A9XX0 Chronic gout, unspecified, without tophus (tophi): Secondary | ICD-10-CM

## 2015-12-20 DIAGNOSIS — I10 Essential (primary) hypertension: Secondary | ICD-10-CM | POA: Diagnosis present

## 2015-12-20 DIAGNOSIS — Z8249 Family history of ischemic heart disease and other diseases of the circulatory system: Secondary | ICD-10-CM | POA: Diagnosis not present

## 2015-12-20 DIAGNOSIS — N179 Acute kidney failure, unspecified: Secondary | ICD-10-CM | POA: Insufficient documentation

## 2015-12-20 DIAGNOSIS — R569 Unspecified convulsions: Secondary | ICD-10-CM

## 2015-12-20 DIAGNOSIS — R471 Dysarthria and anarthria: Secondary | ICD-10-CM | POA: Diagnosis present

## 2015-12-20 DIAGNOSIS — E876 Hypokalemia: Secondary | ICD-10-CM | POA: Insufficient documentation

## 2015-12-20 DIAGNOSIS — F4489 Other dissociative and conversion disorders: Secondary | ICD-10-CM | POA: Diagnosis present

## 2015-12-20 DIAGNOSIS — R55 Syncope and collapse: Secondary | ICD-10-CM | POA: Diagnosis not present

## 2015-12-20 DIAGNOSIS — G309 Alzheimer's disease, unspecified: Secondary | ICD-10-CM | POA: Diagnosis present

## 2015-12-20 DIAGNOSIS — I6789 Other cerebrovascular disease: Secondary | ICD-10-CM | POA: Diagnosis not present

## 2015-12-20 DIAGNOSIS — M10072 Idiopathic gout, left ankle and foot: Secondary | ICD-10-CM | POA: Diagnosis not present

## 2015-12-20 DIAGNOSIS — Z9079 Acquired absence of other genital organ(s): Secondary | ICD-10-CM | POA: Diagnosis not present

## 2015-12-20 DIAGNOSIS — F01518 Vascular dementia, unspecified severity, with other behavioral disturbance: Secondary | ICD-10-CM | POA: Insufficient documentation

## 2015-12-20 DIAGNOSIS — F0280 Dementia in other diseases classified elsewhere without behavioral disturbance: Secondary | ICD-10-CM | POA: Diagnosis present

## 2015-12-20 LAB — CBC WITH DIFFERENTIAL/PLATELET
BASOS ABS: 0 10*3/uL (ref 0–0.1)
Basophils Relative: 0 %
Eosinophils Absolute: 0.1 10*3/uL (ref 0–0.7)
HCT: 44.2 % (ref 40.0–52.0)
Hemoglobin: 14.3 g/dL (ref 13.0–18.0)
Lymphs Abs: 1 10*3/uL (ref 1.0–3.6)
MCH: 24 pg — ABNORMAL LOW (ref 26.0–34.0)
MCHC: 32.4 g/dL (ref 32.0–36.0)
MCV: 74.1 fL — AB (ref 80.0–100.0)
Monocytes Absolute: 0.8 10*3/uL (ref 0.2–1.0)
Monocytes Relative: 10 %
Neutro Abs: 6 10*3/uL (ref 1.4–6.5)
Neutrophils Relative %: 76 %
PLATELETS: 180 10*3/uL (ref 150–440)
RBC: 5.96 MIL/uL — AB (ref 4.40–5.90)
RDW: 18.6 % — ABNORMAL HIGH (ref 11.5–14.5)
WBC: 7.9 10*3/uL (ref 3.8–10.6)

## 2015-12-20 LAB — URINE DRUG SCREEN, QUALITATIVE (ARMC ONLY)
Amphetamines, Ur Screen: NOT DETECTED
Barbiturates, Ur Screen: NOT DETECTED
Benzodiazepine, Ur Scrn: NOT DETECTED
COCAINE METABOLITE, UR ~~LOC~~: NOT DETECTED
Cannabinoid 50 Ng, Ur ~~LOC~~: NOT DETECTED
MDMA (Ecstasy)Ur Screen: NOT DETECTED
Methadone Scn, Ur: NOT DETECTED
Opiate, Ur Screen: NOT DETECTED
Phencyclidine (PCP) Ur S: NOT DETECTED
TRICYCLIC, UR SCREEN: NOT DETECTED

## 2015-12-20 LAB — COMPREHENSIVE METABOLIC PANEL
ALT: 30 U/L (ref 17–63)
AST: 29 U/L (ref 15–41)
Albumin: 4 g/dL (ref 3.5–5.0)
Alkaline Phosphatase: 47 U/L (ref 38–126)
Anion gap: 11 (ref 5–15)
BUN: 24 mg/dL — AB (ref 6–20)
CHLORIDE: 108 mmol/L (ref 101–111)
CO2: 19 mmol/L — AB (ref 22–32)
CREATININE: 1.8 mg/dL — AB (ref 0.61–1.24)
Calcium: 9.4 mg/dL (ref 8.9–10.3)
GFR calc Af Amer: 41 mL/min — ABNORMAL LOW (ref >60)
GFR calc non Af Amer: 36 mL/min — ABNORMAL LOW (ref >60)
Glucose, Bld: 121 mg/dL — ABNORMAL HIGH (ref 65–99)
Potassium: 3.5 mmol/L (ref 3.5–5.1)
SODIUM: 138 mmol/L (ref 135–145)
Total Bilirubin: 0.4 mg/dL (ref 0.3–1.2)
Total Protein: 7.8 g/dL (ref 6.5–8.1)

## 2015-12-20 LAB — TROPONIN I: Troponin I: <0.03 ng/mL (ref <0.031)

## 2015-12-20 LAB — URINALYSIS COMPLETE WITH MICROSCOPIC (ARMC ONLY)
Bacteria, UA: NONE SEEN
Bilirubin Urine: NEGATIVE
Glucose, UA: NEGATIVE mg/dL
Ketones, ur: NEGATIVE mg/dL
Leukocytes, UA: NEGATIVE
NITRITE: NEGATIVE
PROTEIN: 30 mg/dL — AB
SPECIFIC GRAVITY, URINE: 1.014 (ref 1.005–1.030)
Squamous Epithelial / LPF: NONE SEEN
pH: 5 (ref 5.0–8.0)

## 2015-12-20 LAB — TSH: TSH: 10.671 u[IU]/mL — ABNORMAL HIGH (ref 0.350–4.500)

## 2015-12-20 LAB — PHOSPHORUS: Phosphorus: 3.9 mg/dL (ref 2.5–4.6)

## 2015-12-20 LAB — MAGNESIUM: MAGNESIUM: 2.1 mg/dL (ref 1.7–2.4)

## 2015-12-20 LAB — HEMOGLOBIN A1C: Hgb A1c MFr Bld: 6.2 % — ABNORMAL HIGH (ref 4.0–6.0)

## 2015-12-20 LAB — ETHANOL: Alcohol, Ethyl (B): <5 mg/dL (ref <5)

## 2015-12-20 LAB — PROTIME-INR
INR: 1.13
Prothrombin Time: 14.7 seconds (ref 11.4–15.0)

## 2015-12-20 MED ORDER — HEPARIN SODIUM (PORCINE) 5000 UNIT/ML IJ SOLN
5000.0000 [IU] | Freq: Three times a day (TID) | INTRAMUSCULAR | Status: DC
  Administered 2015-12-20 (×2): 5000 [IU] via SUBCUTANEOUS
  Filled 2015-12-20: qty 1

## 2015-12-20 MED ORDER — FLUTICASONE PROPIONATE 50 MCG/ACT NA SUSP
2.0000 | Freq: Every day | NASAL | Status: DC
  Filled 2015-12-20: qty 16

## 2015-12-20 MED ORDER — ACETAMINOPHEN 325 MG PO TABS
650.0000 mg | ORAL_TABLET | Freq: Four times a day (QID) | ORAL | Status: DC | PRN
Start: 2015-12-20 — End: 2015-12-24
  Administered 2015-12-21 – 2015-12-24 (×4): 650 mg via ORAL
  Filled 2015-12-20 (×4): qty 2

## 2015-12-20 MED ORDER — HYDRALAZINE HCL 20 MG/ML IJ SOLN
10.0000 mg | Freq: Once | INTRAMUSCULAR | Status: AC
  Administered 2015-12-20: 10 mg via INTRAVENOUS
  Filled 2015-12-20: qty 1

## 2015-12-20 MED ORDER — ONDANSETRON HCL 4 MG/2ML IJ SOLN: 4.0000 mg | Freq: Four times a day (QID) | INTRAMUSCULAR | Status: DC | PRN

## 2015-12-20 MED ORDER — QUETIAPINE FUMARATE 25 MG PO TABS: 50.0000 mg | ORAL_TABLET | Freq: Every day | ORAL | Status: DC

## 2015-12-20 MED ORDER — DONEPEZIL HCL 5 MG PO TABS
5.0000 mg | ORAL_TABLET | Freq: Every day | ORAL | Status: DC
  Administered 2015-12-20: 09:00:00 5 mg via ORAL
  Filled 2015-12-20: qty 1

## 2015-12-20 MED ORDER — SODIUM CHLORIDE 0.9 % IV SOLN
200.0000 mg | Freq: Once | INTRAVENOUS | Status: AC
  Administered 2015-12-20: 12:00:00 200 mg via INTRAVENOUS
  Filled 2015-12-20: qty 20

## 2015-12-20 MED ORDER — STROKE: EARLY STAGES OF RECOVERY BOOK
Freq: Once | Status: AC
  Administered 2015-12-20: 22:00:00
  Filled 2015-12-20: qty 1

## 2015-12-20 MED ORDER — ACETAMINOPHEN 650 MG RE SUPP
650.0000 mg | Freq: Four times a day (QID) | RECTAL | Status: DC | PRN
  Administered 2015-12-20: 650 mg via RECTAL
  Filled 2015-12-20: qty 1

## 2015-12-20 MED ORDER — GADOBENATE DIMEGLUMINE 529 MG/ML IV SOLN
10.0000 mL | Freq: Once | INTRAVENOUS | Status: AC | PRN
  Administered 2015-12-20: 10 mL via INTRAVENOUS

## 2015-12-20 MED ORDER — SODIUM CHLORIDE 0.9 % IV SOLN: 100.0000 mL | INTRAVENOUS | Status: DC

## 2015-12-20 MED ORDER — DEXTROSE 5 % IV SOLN
1.0000 g | INTRAVENOUS | Status: DC
  Administered 2015-12-20: 1 g via INTRAVENOUS
  Filled 2015-12-20: qty 10

## 2015-12-20 MED ORDER — VANCOMYCIN HCL IN DEXTROSE 750-5 MG/150ML-% IV SOLN
750.0000 mg | Freq: Two times a day (BID) | INTRAVENOUS | Status: DC
  Administered 2015-12-21: 750 mg via INTRAVENOUS
  Filled 2015-12-20 (×2): qty 150

## 2015-12-20 MED ORDER — LACOSAMIDE 100 MG PO TABS: 100.0000 mg | ORAL_TABLET | Freq: Two times a day (BID) | ORAL | Status: DC

## 2015-12-20 MED ORDER — LISINOPRIL 20 MG PO TABS
20.0000 mg | ORAL_TABLET | Freq: Every day | ORAL | Status: DC
  Administered 2015-12-20: 20 mg via ORAL
  Filled 2015-12-20: qty 1

## 2015-12-20 MED ORDER — ASPIRIN EC 81 MG PO TBEC
81.0000 mg | DELAYED_RELEASE_TABLET | Freq: Every day | ORAL | Status: DC
  Filled 2015-12-20: qty 1

## 2015-12-20 MED ORDER — MORPHINE SULFATE (PF) 2 MG/ML IV SOLN: 1.0000 mg | INTRAVENOUS | Status: DC | PRN

## 2015-12-20 MED ORDER — LACOSAMIDE 50 MG PO TABS
100.0000 mg | ORAL_TABLET | Freq: Two times a day (BID) | ORAL | Status: DC
  Administered 2015-12-20 – 2015-12-24 (×8): 100 mg via ORAL
  Filled 2015-12-20 (×7): qty 2

## 2015-12-20 MED ORDER — ASPIRIN EC 81 MG PO TBEC
81.0000 mg | DELAYED_RELEASE_TABLET | Freq: Every day | ORAL | Status: DC
  Administered 2015-12-21: 81 mg via ORAL
  Filled 2015-12-20: qty 1

## 2015-12-20 MED ORDER — LORAZEPAM 2 MG/ML IJ SOLN
INTRAMUSCULAR | Status: AC
  Administered 2015-12-20: 1 mg via INTRAVENOUS
  Filled 2015-12-20: qty 1

## 2015-12-20 MED ORDER — ACETAMINOPHEN 325 MG PO TABS: 650.0000 mg | ORAL_TABLET | Freq: Four times a day (QID) | ORAL | Status: DC | PRN

## 2015-12-20 MED ORDER — SODIUM CHLORIDE 0.9 % IV SOLN
500.0000 mg | Freq: Once | INTRAVENOUS | Status: AC
  Administered 2015-12-20: 500 mg via INTRAVENOUS
  Filled 2015-12-20: qty 5

## 2015-12-20 MED ORDER — SENNOSIDES-DOCUSATE SODIUM 8.6-50 MG PO TABS
1.0000 | ORAL_TABLET | Freq: Every evening | ORAL | Status: DC | PRN
  Filled 2015-12-20: qty 1

## 2015-12-20 MED ORDER — LORAZEPAM 2 MG/ML IJ SOLN
0.5000 mg | Freq: Once | INTRAMUSCULAR | Status: AC
  Administered 2015-12-20: 17:00:00 0.5 mg via INTRAVENOUS
  Filled 2015-12-20: qty 1

## 2015-12-20 MED ORDER — CEFTRIAXONE SODIUM 2 G IJ SOLR
2.0000 g | Freq: Two times a day (BID) | INTRAMUSCULAR | Status: DC
  Filled 2015-12-20 (×2): qty 2

## 2015-12-20 MED ORDER — QUETIAPINE FUMARATE 25 MG PO TABS
25.0000 mg | ORAL_TABLET | Freq: Every day | ORAL | Status: DC
  Administered 2015-12-20 – 2015-12-23 (×4): 25 mg via ORAL
  Filled 2015-12-20 (×4): qty 1

## 2015-12-20 MED ORDER — IBUPROFEN 600 MG PO TABS
600.0000 mg | ORAL_TABLET | Freq: Four times a day (QID) | ORAL | Status: DC | PRN
  Administered 2015-12-20: 14:00:00 600 mg via ORAL
  Filled 2015-12-20: qty 1

## 2015-12-20 MED ORDER — VANCOMYCIN HCL 10 G IV SOLR
1250.0000 mg | Freq: Once | INTRAVENOUS | Status: AC
  Administered 2015-12-20: 1250 mg via INTRAVENOUS
  Filled 2015-12-20: qty 1250

## 2015-12-20 MED ORDER — METOPROLOL SUCCINATE ER 50 MG PO TB24
100.0000 mg | ORAL_TABLET | Freq: Every day | ORAL | Status: DC
  Administered 2015-12-20: 09:00:00 100 mg via ORAL
  Filled 2015-12-20: qty 2

## 2015-12-20 MED ORDER — DONEPEZIL HCL 5 MG PO TABS
5.0000 mg | ORAL_TABLET | Freq: Every day | ORAL | Status: DC
  Administered 2015-12-21 – 2015-12-24 (×4): 5 mg via ORAL
  Filled 2015-12-20 (×6): qty 1

## 2015-12-20 MED ORDER — ASPIRIN 81 MG PO TBEC
81.0000 mg | DELAYED_RELEASE_TABLET | Freq: Every day | ORAL | Status: DC
Start: 2015-12-20 — End: 2015-12-24

## 2015-12-20 MED ORDER — LACOSAMIDE 50 MG PO TABS: 100.0000 mg | ORAL_TABLET | Freq: Two times a day (BID) | ORAL | Status: DC

## 2015-12-20 MED ORDER — LEVETIRACETAM 500 MG PO TABS: 500.0000 mg | ORAL_TABLET | Freq: Two times a day (BID) | ORAL | Status: DC

## 2015-12-20 MED ORDER — DEXTROSE 5 % IV SOLN: 2.0000 g | Freq: Two times a day (BID) | INTRAVENOUS | Status: DC

## 2015-12-20 MED ORDER — AMLODIPINE BESYLATE 5 MG PO TABS
5.0000 mg | ORAL_TABLET | Freq: Every day | ORAL | Status: DC
  Administered 2015-12-20: 5 mg via ORAL
  Filled 2015-12-20: qty 1

## 2015-12-20 MED ORDER — SODIUM CHLORIDE 0.9 % IV SOLN
INTRAVENOUS | Status: DC
  Administered 2015-12-20: 07:00:00 via INTRAVENOUS

## 2015-12-20 MED ORDER — MEMANTINE HCL ER 28 MG PO CP24
28.0000 mg | ORAL_CAPSULE | Freq: Every day | ORAL | Status: DC
  Filled 2015-12-20 (×4): qty 1

## 2015-12-20 MED ORDER — LORAZEPAM 2 MG/ML IJ SOLN
1.0000 mg | Freq: Once | INTRAMUSCULAR | Status: AC
  Administered 2015-12-20: 1 mg via INTRAVENOUS

## 2015-12-20 MED ORDER — DOCUSATE SODIUM 100 MG PO CAPS
100.0000 mg | ORAL_CAPSULE | Freq: Two times a day (BID) | ORAL | Status: DC
  Administered 2015-12-20: 100 mg via ORAL
  Filled 2015-12-20: qty 1

## 2015-12-20 MED ORDER — ONDANSETRON HCL 4 MG PO TABS: 4.0000 mg | ORAL_TABLET | Freq: Four times a day (QID) | ORAL | Status: DC | PRN

## 2015-12-20 NOTE — ED Notes (Addendum)
Wife pressed call bell for seizure activity, Ireland RN, Racquel RN, Lea RN, and EDT at bedside, NRB placed, suction set up, 1 ativan given.  Dr. Beather Arbour at bedside, Dr. Marcille Blanco paged.

## 2015-12-20 NOTE — ED Notes (Signed)
Pt to rm 18 via EMS from home.  EMS report wife report possible seizure.  Pt hx of demential, ems report pt post-ictal upon arrival but now more alert.  Pt w/ shallow abrasion to top of head.  Pt oriented to self only but baseline dementia.  Pt w/ equal hand grasp, no facial droop.  Wife reports in past this type of activity associated with "mini-seizures".  Pt NAD, resp equal and unlabored, skin warm and dry.  Pt o2sat low at 93% on RA, 2L applied.

## 2015-12-20 NOTE — Consult Note (Signed)
75 yo with Alzheimer's dementia transferred here from [facility].  He is a poor historian and wife is not here.  According to records, he had one GTCS at home and another GTCS in ER.  He was given Ativan and placed on Vimpat after a loading dose of 200 mg qd.  He is at baseline right now.    I reviewed his MRI Brain personally which shows 2 tiny frontal subcortical infarcts on 1 tiny right posterior temporal infarct on DWI.  He also has moderate small vessel ischemic disease periventricularly.    He had a fever at outside hospital and there is a question of endocarditis with septic embolism.  Blood culture is pending.  UDS was negative.  Neuro exam:  Awake, alert, disoriented to year, month, date, day, city, president.  No focal neuro deficits.    I do not appreciate a specific murmur.   A/P:  1. 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  EEG tomorrow.    2. 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  They may be cardioembolic and perhaps septic embolism given fever.  Recommend TEE, f/u blood cultures, empiric antibiotic, MRA Brain and Neck.  These strokes are not causing any significant symptoms.

## 2015-12-20 NOTE — Discharge Summary (Signed)
[facility] at [place] NAME: Philip Moreno    MR#:  [id number]  DATE OF BIRTH:  1940-08-23  DATE OF ADMISSION:  12/20/2015 ADMITTING PHYSICIAN: Harrie Foreman, MD  DATE OF DISCHARGE: 12/20/2015 to [facility]  PRIMARY CARE PHYSICIAN: [name], MD    ADMISSION DIAGNOSIS:  Confusion state [F44.89] Seizure [facility]) [R56.9] Essential hypertension [I10] Convulsions, unspecified convulsion type ([place]) [R56.9]  DISCHARGE DIAGNOSIS:  Active Problems:   Seizure ([place])   SECONDARY DIAGNOSIS:   Past Medical History  Diagnosis Date  . Hypertension   . GERD (gastroesophageal reflux disease)   . Arthritis   . Cancer [facility]) 05/2011    prostate cancer, [facility]  . Memory loss   . TIA (transient ischemic attack)     HOSPITAL COURSE:   1. Seizure with continued lethargy. Patient not back to baseline mental status. Case discussed with Dr. Alexis Goodell neurology and since the patient is still lethargic we need to transfer to a tertiary care center where an EEG can be done to rule out status epilepticus. EEG unable to be done today. Patient currently down in the MRI machine. Patient was started on Keppra and then switched over to Vimpat by neurology. 2. Fever unclear etiology at this point. I ordered blood cultures. I ordered a repeat chest x-ray for the morning to rule out aspiration. I empirically put on Rocephin with the patient's confusion and I will increase the dose to 2 g every 12 hours until seen by neurology at [facility]. Continue IV fluid hydration. 3. Essential hypertension on amlodipine, lisinopril and metoprolol 4. History of memory loss on Aricept 5. Chronic kidney disease stage III. Continue to monitor  DISCHARGE CONDITIONS:   Fair  CONSULTS OBTAINED:  Treatment Team:  Alexis Goodell, MD  DRUG ALLERGIES:  No Known Allergies  DISCHARGE MEDICATIONS:   Current Discharge Medication List    START  taking these medications   Details  acetaminophen (TYLENOL) 325 MG tablet Take 2 tablets (650 mg total) by mouth every 6 (six) hours as needed for mild pain (or Fever >/= 101).    aspirin EC 81 MG EC tablet Take 1 tablet (81 mg total) by mouth daily.    cefTRIAXone 2 g in dextrose 5 % 50 mL Inject 2 g into the vein every 12 (twelve) hours.    lacosamide 100 MG TABS Take 1 tablet (100 mg total) by mouth 2 (two) times daily. Qty: 60 tablet    sodium chloride 0.9 % infusion Inject 100 mLs into the vein continuous. Refills: 0      CONTINUE these medications which have NOT CHANGED   Details  donepezil (ARICEPT) 5 MG tablet Take 1 tablet (5 mg total) by mouth daily. Qty: 30 tablet, Refills: 11    lisinopril (PRINIVIL,ZESTRIL) 20 MG tablet Take 20 mg by mouth daily.    metoprolol succinate (TOPROL-XL) 100 MG 24 hr tablet Take 100 mg by mouth daily. Take with or immediately following a meal.    QUEtiapine (SEROQUEL) 25 MG tablet Take 2 tablets (50 mg total) by mouth at bedtime. Reported on 08/13/2015 Qty: 60 tablet, Refills: 11    fluticasone (FLONASE) 50 MCG/ACT nasal spray Place 2 sprays into both nostrils daily. Qty: 16 g, Refills: 2    memantine (NAMENDA XR) 28 MG CP24 24 hr capsule Take 1 capsule (28 mg total) by mouth daily. Qty: 30 capsule, Refills: 11  DISCHARGE INSTRUCTIONS:   Fair  If you experience worsening of your admission symptoms, develop shortness of breath, life threatening emergency, suicidal or homicidal thoughts you must seek medical attention immediately by calling 911 or calling your MD immediately  if symptoms less severe.  You Must read complete instructions/literature along with all the possible adverse reactions/side effects for all the Medicines you take and that have been prescribed to you. Take any new Medicines after you have completely understood and accept all the possible adverse reactions/side effects.   Please note  You were cared for  by a hospitalist during your hospital stay. If you have any questions about your discharge medications or the care you received while you were in the hospital after you are discharged, you can call the unit and asked to speak with the hospitalist on call if the hospitalist that took care of you is not available. Once you are discharged, your primary care physician will handle any further medical issues. Please note that NO REFILLS for any discharge medications will be authorized once you are discharged, as it is imperative that you return to your primary care physician (or establish a relationship with a primary care physician if you do not have one) for your aftercare needs so that they can reassess your need for medications and monitor your lab values.    Today   CHIEF COMPLAINT:   Chief Complaint  Patient presents with  . Seizures    HISTORY OF PRESENT ILLNESS:  Philip Moreno  is a 75 y.o. male with a known history of Seizure disorder. Presents to the hospital with 2 seizures   VITAL SIGNS:  Blood pressure 143/97, pulse 117, temperature 99.7 F (37.6 C), temperature source Oral, resp. rate 20, height 6\' 1"  (1.854 m), weight 93.895 kg (207 lb), SpO2 97 %.   DATA REVIEW:   CBC  Recent Labs Lab 12/20/15 0407  WBC 7.9  HGB 14.3  HCT 44.2  PLT 180    Chemistries   Recent Labs Lab 12/20/15 0407 12/20/15 1600  NA 138  --   K 3.5  --   CL 108  --   CO2 19*  --   GLUCOSE 121*  --   BUN 24*  --   CREATININE 1.80*  --   CALCIUM 9.4  --   MG  --  2.1  AST 29  --   ALT 30  --   ALKPHOS 47  --   BILITOT 0.4  --     Cardiac Enzymes  Recent Labs Lab 12/20/15 0407  TROPONINI <0.03    Microbiology Results  Results for orders placed or performed during the hospital encounter of 08/18/12  Surgical pcr screen     Status: None   Collection Time: 08/18/12  9:45 AM  Result Value Ref Range Status   MRSA, PCR NEGATIVE NEGATIVE Final   Staphylococcus aureus NEGATIVE  NEGATIVE Final    Comment:        The Xpert SA Assay (FDA approved for NASAL specimens in patients over 64 years of age), is one component of a comprehensive surveillance program.  Test performance has been validated by [employer] for patients greater than or equal to 36 year old. It is not intended to diagnose infection nor to guide or monitor treatment.    RADIOLOGY:  Ct Head Wo Contrast  12/20/2015  CLINICAL DATA:  Possible seizure. Shallow abrasions to the top of the head. Baseline dementia. EXAM: CT HEAD WITHOUT CONTRAST TECHNIQUE: Contiguous  axial images were obtained from the base of the skull through the vertex without intravenous contrast. COMPARISON:  10/17/2015 FINDINGS: Diffuse cerebral atrophy. Low-attenuation changes in the deep white matter consistent with small vessel ischemia. Ventricular dilatation consistent with central atrophy. No mass effect or midline shift. No abnormal extra-axial fluid collections. Gray-white matter junctions are distinct. Basal cisterns are not effaced. No evidence of acute intracranial hemorrhage. No depressed skull fractures. Visualized paranasal sinuses and mastoid air cells are not opacified. Focal sclerosis again demonstrated in the right temporal bone without expansion or destruction. IMPRESSION: No acute intracranial abnormalities. Chronic atrophy and small vessel ischemic changes. Electronically Signed   By: Lucienne Capers M.D.   On: 12/20/2015 04:32   Dg Chest Port 1 View  12/20/2015  CLINICAL DATA:  Possible seizure. EXAM: PORTABLE CHEST 1 VIEW COMPARISON:  08/18/2012 FINDINGS: Shallow inspiration with mild linear atelectasis in the lung bases. The heart size and mediastinal contours are within normal limits. Both lungs are clear. The visualized skeletal structures are unremarkable. IMPRESSION: Linear atelectasis in the lung bases. Electronically Signed   By: Lucienne Capers M.D.   On: 12/20/2015 04:41    Management plans discussed with  the patient, family and they are in agreement.  CODE STATUS:     Code Status Orders        Start     Ordered   12/20/15 [phone]  Full code   Continuous     12/20/15 [id number]    Code Status History    Date Active Date Inactive Code Status Order ID Comments User Context   This patient has a current code status but no historical code status.      TOTAL TIME TAKING CARE OF THIS PATIENT: 35 minutes.    Loletha Grayer M.D on 12/20/2015 at 4:57 PM  Between 7am to 6pm - Pager - [phone]  After 6pm go to [email or web address] - password [email or web address]  [facility] Office  [phone]  CC: Primary care physician; [name], MD

## 2015-12-20 NOTE — Care Management Obs Status (Signed)
[place] NOTIFICATION   Patient Details  Name: Philip Moreno MRN: [id number] Date of Birth: 1940-12-12   Medicare Observation Status Notification Given:  Yes    CrutchfieldAntony Haste, RN 12/20/2015, 11:16 AM

## 2015-12-20 NOTE — Progress Notes (Addendum)
Report given to [facility] with [facility] who is here to get the patient via stretcher and EMS. Report called to Gabe at [facility]. Patient Stable at time of discharge.  Patient Unable to swallow at this time due to Decreased level of conciousness. Report given to Gabe at [facility] that patient has not had his ASA yet.

## 2015-12-20 NOTE — ED Notes (Signed)
Pt switched to [place] 4L o2

## 2015-12-20 NOTE — Consult Note (Signed)
Reason for Consult:Seizures Referring Physician: Leslye Peer  CC: Seizures  HPI: Philip Moreno is an 75 y.o. male with past medical history of hypertension and dementia presents to the emergency department via EMS following an episode of seizure activity. His last seizure was 3 months ago. He has had approximately 3 episodes of seizure activity in the past but he was not started on antiepileptic medication. The patient's wife states that she was awoken by the patient's convulsions. She describes the patient eyes rolling backward and him foaming from his mouth. The convulsions appeared to be full body biphasic movements although she admits his arms were contracted close to his chest more often than not. He did not lose continence of bowel or bladder. The seizure activity lasted approximately 1 minute. In the emergency department the patient was found to be slightly groggy. Following initial history and physical the patient had another seizure lasting about 1 minute. He was given Ativan prior to admission to the hospitalist service for further neurologic workup.  Past Medical History  Diagnosis Date  . Hypertension   . GERD (gastroesophageal reflux disease)   . Arthritis   . Cancer [facility]) 05/2011    prostate cancer, [facility]  . Memory loss   . TIA (transient ischemic attack)     Past Surgical History  Procedure Laterality Date  . Robot assisted laparoscopic radical prostatectomy  08/25/2012    Procedure: ROBOTIC ASSISTED LAPAROSCOPIC RADICAL PROSTATECTOMY LEVEL 2;  Surgeon: Dutch Gray, MD;  Location: [facility]S;  Service: Urology;  Laterality: N/A;  . Lymphadenectomy  08/25/2012    Procedure: LYMPHADENECTOMY;  Surgeon: Dutch Gray, MD;  Location: [facility] ORS;  Service: Urology;  Laterality: Bilateral;  . Prostate surgery  2012  . Colonoscopy with propofol N/A 02/27/2015    Procedure: COLONOSCOPY WITH PROPOFOL;  Surgeon: Christene Lye, MD;  Location: [facility] ENDOSCOPY;  Service: Endoscopy;  Laterality:  N/A;    Family History  Problem Relation Age of Onset  . Hypertension Mother   . Heart attack Father   . Cancer Brother   . Heart failure Brother   . Diabetes Brother     Social History:  reports that he has never smoked. His smokeless tobacco use includes Snuff and Chew. He reports that he does not drink alcohol or use illicit drugs.  No Known Allergies  Medications:  I have reviewed the patient's current medications. Prior to Admission:  Prescriptions prior to admission  Medication Sig Dispense Refill Last Dose  . donepezil (ARICEPT) 5 MG tablet Take 1 tablet (5 mg total) by mouth daily. 30 tablet 11 12/19/2015 at 0830  . lisinopril (PRINIVIL,ZESTRIL) 20 MG tablet Take 20 mg by mouth daily.   12/19/2015 at 0830  . metoprolol succinate (TOPROL-XL) 100 MG 24 hr tablet Take 100 mg by mouth daily. Take with or immediately following a meal.   Past Week at Unknown time  . QUEtiapine (SEROQUEL) 25 MG tablet Take 2 tablets (50 mg total) by mouth at bedtime. Reported on 08/13/2015 (Patient taking differently: Take 25 mg by mouth at bedtime. Reported on 08/13/2015) 60 tablet 11 12/19/2015 at 2130  . fluticasone (FLONASE) 50 MCG/ACT nasal spray Place 2 sprays into both nostrils daily. 16 g 2 Taking  . memantine (NAMENDA XR) 28 MG CP24 24 hr capsule Take 1 capsule (28 mg total) by mouth daily. 30 capsule 11   . QUEtiapine (SEROQUEL) 25 MG tablet START WITH 1 TABLET BY MOUTH TWICE DAILY, AND MAY INCREASE TO 2 TABLETS TWICE DAILY  AS DIRECTED 60 tablet 0 Taking   Scheduled: . amLODipine  5 mg Oral Daily  . cefTRIAXone (ROCEPHIN)  IV  1 g Intravenous Q24H  . docusate sodium  100 mg Oral BID  . donepezil  5 mg Oral Daily  . heparin  5,000 Units Subcutaneous Q8H  . levETIRAcetam  500 mg Oral BID  . lisinopril  20 mg Oral Daily  . metoprolol succinate  100 mg Oral Daily  . QUEtiapine  50 mg Oral QHS    ROS: History obtained from wife  General ROS: negative for - chills, fatigue, fever, night  sweats, weight gain or weight loss Psychological ROS: memory difficulties Ophthalmic ROS: negative for - blurry vision, double vision, eye pain or loss of vision ENT ROS: negative for - epistaxis, nasal discharge, oral lesions, sore throat, tinnitus or vertigo Allergy and Immunology ROS: negative for - hives or itchy/watery eyes Hematological and Lymphatic ROS: negative for - bleeding problems, bruising or swollen lymph nodes Endocrine ROS: negative for - galactorrhea, hair pattern changes, polydipsia/polyuria or temperature intolerance Respiratory ROS: negative for - cough, hemoptysis, shortness of breath or wheezing Cardiovascular ROS: negative for - chest pain, dyspnea on exertion, edema or irregular heartbeat Gastrointestinal ROS: negative for - abdominal pain, diarrhea, hematemesis, nausea/vomiting or stool incontinence Genito-Urinary ROS: negative for - dysuria, hematuria, incontinence or urinary frequency/urgency Musculoskeletal ROS: negative for - joint swelling or muscular weakness Neurological ROS: as noted in HPI Dermatological ROS: negative for rash and skin lesion changes  Physical Examination: Blood pressure 143/97, pulse 117, temperature 100.2 F (37.9 C), temperature source Oral, resp. rate 20, height 6\' 1"  (1.854 m), weight 93.895 kg (207 lb), SpO2 97 %.  HEENT-  Normocephalic, no lesions, without obvious abnormality.  Normal external eye and conjunctiva.  Normal TM's bilaterally.  Normal auditory canals and external ears. Normal external nose, mucus membranes and septum.  Normal pharynx. Cardiovascular- S1, S2 normal, pulses palpable throughout   Lungs- chest clear, no wheezing, rales, normal symmetric air entry Abdomen- soft, non-tender; bowel sounds normal; no masses,  no organomegaly Extremities- no edema Lymph-no adenopathy palpable Musculoskeletal-no joint tenderness, deformity or swelling Skin-warm and dry, no hyperpigmentation, vitiligo, or suspicious  lesions  Neurological Examination Mental Status: Lethargic.  Follows some simple commands.  Speech slurred but fluent.   Cranial Nerves: II: Discs flat bilaterally; Visual fields grossly normal, pupils equal, round, reactive to light and accommodation III,IV, VI: ptosis not present, extra-ocular motions intact bilaterally V,VII: grimace symmetric, facial light touch sensation normal bilaterally VIII: hearing normal bilaterally IX,X: gag reflex present XI: bilateral shoulder shrug XII: midline tongue extension Motor: Moves all extremities against gravity to command Sensory: Responds to light noxious stimuli throughout Deep Tendon Reflexes: 2+ and symmetric throughout Plantars: Right: mute   Left: mute Cerebellar: Unable to test Gait: not tested due to safety concerns   Laboratory Studies:   Basic Metabolic Panel:  Recent Labs Lab 12/20/15 0407  NA 138  K 3.5  CL 108  CO2 19*  GLUCOSE 121*  BUN 24*  CREATININE 1.80*  CALCIUM 9.4    Liver Function Tests:  Recent Labs Lab 12/20/15 0407  AST 29  ALT 30  ALKPHOS 47  BILITOT 0.4  PROT 7.8  ALBUMIN 4.0   No results for input(s): LIPASE, AMYLASE in the last 168 hours. No results for input(s): AMMONIA in the last 168 hours.  CBC:  Recent Labs Lab 12/20/15 0407  WBC 7.9  NEUTROABS 6.0  HGB 14.3  HCT 44.2  MCV 74.1*  PLT 180    Cardiac Enzymes:  Recent Labs Lab 12/20/15 0407  TROPONINI <0.03    BNP: Invalid input(s): POCBNP  CBG: No results for input(s): GLUCAP in the last 168 hours.  Microbiology: Results for orders placed or performed during the hospital encounter of 08/18/12  Surgical pcr screen     Status: None   Collection Time: 08/18/12  9:45 AM  Result Value Ref Range Status   MRSA, PCR NEGATIVE NEGATIVE Final   Staphylococcus aureus NEGATIVE NEGATIVE Final    Comment:        The Xpert SA Assay (FDA approved for NASAL specimens in patients over 104 years of age), is one component  of a comprehensive surveillance program.  Test performance has been validated by [employer] for patients greater than or equal to 8 year old. It is not intended to diagnose infection nor to guide or monitor treatment.    Coagulation Studies:  Recent Labs  12/20/15 0407  LABPROT 14.7  INR 1.13    Urinalysis:  Recent Labs Lab 12/20/15 0439  COLORURINE YELLOW*  LABSPEC 1.014  PHURINE 5.0  GLUCOSEU NEGATIVE  HGBUR 1+*  BILIRUBINUR NEGATIVE  KETONESUR NEGATIVE  PROTEINUR 30*  NITRITE NEGATIVE  LEUKOCYTESUR NEGATIVE    Lipid Panel:  No results found for: CHOL, TRIG, HDL, CHOLHDL, VLDL, LDLCALC  HgbA1C: No results found for: HGBA1C  Urine Drug Screen:     Component Value Date/Time   LABOPIA NONE DETECTED 12/20/2015 0439   COCAINSCRNUR NONE DETECTED 12/20/2015 0439   LABBENZ NONE DETECTED 12/20/2015 0439   AMPHETMU NONE DETECTED 12/20/2015 0439   THCU NONE DETECTED 12/20/2015 0439   LABBARB NONE DETECTED 12/20/2015 0439    Alcohol Level:  Recent Labs Lab 12/20/15 0407  ETH <5    Other results: EKG: sinus rhythm at 93 bpm.  Imaging: Ct Head Wo Contrast  12/20/2015  CLINICAL DATA:  Possible seizure. Shallow abrasions to the top of the head. Baseline dementia. EXAM: CT HEAD WITHOUT CONTRAST TECHNIQUE: Contiguous axial images were obtained from the base of the skull through the vertex without intravenous contrast. COMPARISON:  10/17/2015 FINDINGS: Diffuse cerebral atrophy. Low-attenuation changes in the deep white matter consistent with small vessel ischemia. Ventricular dilatation consistent with central atrophy. No mass effect or midline shift. No abnormal extra-axial fluid collections. Gray-white matter junctions are distinct. Basal cisterns are not effaced. No evidence of acute intracranial hemorrhage. No depressed skull fractures. Visualized paranasal sinuses and mastoid air cells are not opacified. Focal sclerosis again demonstrated in the right temporal bone  without expansion or destruction. IMPRESSION: No acute intracranial abnormalities. Chronic atrophy and small vessel ischemic changes. Electronically Signed   By: Lucienne Capers M.D.   On: 12/20/2015 04:32   Dg Chest Port 1 View  12/20/2015  CLINICAL DATA:  Possible seizure. EXAM: PORTABLE CHEST 1 VIEW COMPARISON:  08/18/2012 FINDINGS: Shallow inspiration with mild linear atelectasis in the lung bases. The heart size and mediastinal contours are within normal limits. Both lungs are clear. The visualized skeletal structures are unremarkable. IMPRESSION: Linear atelectasis in the lung bases. Electronically Signed   By: Lucienne Capers M.D.   On: 12/20/2015 04:41     Assessment/Plan: Patient with recurrent seizure activity.  Lethargic at this time and unclear if having further seizure activity or if lethargic from medication.  Will yell out at times.  Wife reports was doing this with seizure activity.  No maintenance anticonvulsant therapy initiated.  Is seeing neurology on an outpatient basis  for vertigo and MRI of the brain ordered.  Recommendations: 1. Due to lethargy and possibly continued seizure activity would change anticonvulsant therapy to Vimpat.  200mg  IV now with maintenance of 100mg  BID 2.  Seizure precautions 3.  EEG 4.  Serum magnesium and phosphorus 5.  MRI of the brain with and without contrast  Alexis Goodell, MD Neurology [phone] 12/20/2015, 2:56 PM

## 2015-12-20 NOTE — H&P (Signed)
History and Physical    Philip Moreno [id number] DOB: 1941-05-31 DOA: 12/20/2015  Referring MD/NP/PA: Delma Freeze PCP: [name], MD Patient coming from: [facility]  Chief Complaint: AMS  HPI: Philip Moreno is a 75 y.o. male with medical history significant of HTN, dementia, prior TIAs and cerebellar strokes.  Patient presented to the ED at [facility] this morning after an episode of seizure activity.  Patient has had 3 episodes of seizure activity in past but wasn't started on seizure meds.  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  Patient was admitted to [facility] where he had prolonged post-ictal state and so was transferred [facility].  [facility] done [facility] revealed that patient appears to have 3 small areas of acute / subacute ischemic stroke.  Occipital and frontal lobe, they suggest possible embolic event.  Patient also spiked a fever of 101.5 this afternoon.  Blood cultures were drawn and patient given 2gm rocephin prior to transfer.    Review of Systems: As per HPI otherwise 10 point review of systems negative.    Past Medical History  Diagnosis Date  . Hypertension   . GERD (gastroesophageal reflux disease)   . Arthritis   . Cancer [facility]) 05/2011    prostate cancer, [facility]  . Memory loss   . TIA (transient ischemic attack)     Past Surgical History  Procedure Laterality Date  . Robot assisted laparoscopic radical prostatectomy  08/25/2012    Procedure: ROBOTIC ASSISTED LAPAROSCOPIC RADICAL PROSTATECTOMY LEVEL 2;  Surgeon: Dutch Gray, MD;  Location: [facility];  Service: Urology;  Laterality: N/A;  . Lymphadenectomy  08/25/2012    Procedure: LYMPHADENECTOMY;  Surgeon: Dutch Gray, MD;  Location: [facility];  Service: Urology;  Laterality: Bilateral;  . Prostate surgery  2012  . Colonoscopy with propofol N/A 02/27/2015    Procedure: COLONOSCOPY WITH PROPOFOL;  Surgeon: Christene Lye, MD;  Location: [facility] ENDOSCOPY;  Service: Endoscopy;  Laterality: N/A;     reports that he has never smoked. His  smokeless tobacco use includes Snuff and Chew. He reports that he does not drink alcohol or use illicit drugs.  No Known Allergies  Family History  Problem Relation Age of Onset  . Hypertension Mother   . Heart attack Father   . Cancer Brother   . Heart failure Brother   . Diabetes Brother      Prior to Admission medications   Medication Sig Start Date End Date Taking? Authorizing Provider  acetaminophen (TYLENOL) 325 MG tablet Take 2 tablets (650 mg total) by mouth every 6 (six) hours as needed for mild pain (or Fever >/= 101). 12/20/15   Loletha Grayer, MD  aspirin EC 81 MG EC tablet Take 1 tablet (81 mg total) by mouth daily. 12/20/15   Loletha Grayer, MD  cefTRIAXone 2 g in dextrose 5 % 50 mL Inject 2 g into the vein every 12 (twelve) hours. 12/20/15   Loletha Grayer, MD  donepezil (ARICEPT) 5 MG tablet Take 1 tablet (5 mg total) by mouth daily. 08/13/15   Melvenia Beam, MD  fluticasone (FLONASE) 50 MCG/ACT nasal spray Place 2 sprays into both nostrils daily. 03/01/15   Jan Fireman, PA-C  lacosamide 100 MG TABS Take 1 tablet (100 mg total) by mouth 2 (two) times daily. 12/20/15   Loletha Grayer, MD  lisinopril (PRINIVIL,ZESTRIL) 20 MG tablet Take 20 mg by mouth daily.    Historical Provider, MD  memantine (NAMENDA XR) 28 MG CP24 24 hr capsule Take 1 capsule (  28 mg total) by mouth daily. 08/13/15   Melvenia Beam, MD  metoprolol succinate (TOPROL-XL) 100 MG 24 hr tablet Take 100 mg by mouth daily. Take with or immediately following a meal.    Historical Provider, MD  QUEtiapine (SEROQUEL) 25 MG tablet Take 2 tablets (50 mg total) by mouth at bedtime. Reported on 08/13/2015 Patient taking differently: Take 25 mg by mouth at bedtime. Reported on 08/13/2015 08/13/15   Melvenia Beam, MD  sodium chloride 0.9 % infusion Inject 100 mLs into the vein continuous. 12/20/15   Loletha Grayer, MD    Physical Exam: There were no vitals filed for this visit.    Constitutional: NAD, calm,  comfortable There were no vitals filed for this visit. Eyes: PERRL, lids and conjunctivae normal ENMT: Mucous membranes are moist. Posterior pharynx clear of any exudate or lesions.Normal dentition.  Neck: normal, supple, no masses, no thyromegaly Respiratory: clear to auscultation bilaterally, no wheezing, no crackles. Normal respiratory effort. No accessory muscle use.  Cardiovascular: Regular rate and rhythm, no murmurs / rubs / gallops. No extremity edema. 2+ pedal pulses. No carotid bruits.  Abdomen: no tenderness, no masses palpated. No hepatosplenomegaly. Bowel sounds positive.  Musculoskeletal: no clubbing / cyanosis. No joint deformity upper and lower extremities. Good ROM, no contractures. Normal muscle tone.  Skin: no rashes, lesions, ulcers. No induration Neurologic: CN 2-12 grossly intact. Sensation intact, DTR normal. Strength 5/5 in all 4.  Psychiatric: Normal judgment and insight. Alert and oriented x 3. Normal mood.    Labs on Admission: I have personally reviewed following labs and imaging studies  CBC:  Recent Labs Lab 12/20/15 0407  WBC 7.9  NEUTROABS 6.0  HGB 14.3  HCT 44.2  MCV 74.1*  PLT 99991111   Basic Metabolic Panel:  Recent Labs Lab 12/20/15 0407 12/20/15 1600  NA 138  --   K 3.5  --   CL 108  --   CO2 19*  --   GLUCOSE 121*  --   BUN 24*  --   CREATININE 1.80*  --   CALCIUM 9.4  --   MG  --  2.1  PHOS  --  3.9   GFR: Estimated Creatinine Clearance: 41.3 mL/min (by C-G formula based on Cr of 1.8). Liver Function Tests:  Recent Labs Lab 12/20/15 0407  AST 29  ALT 30  ALKPHOS 47  BILITOT 0.4  PROT 7.8  ALBUMIN 4.0   No results for input(s): LIPASE, AMYLASE in the last 168 hours. No results for input(s): AMMONIA in the last 168 hours. Coagulation Profile:  Recent Labs Lab 12/20/15 0407  INR 1.13   Cardiac Enzymes:  Recent Labs Lab 12/20/15 0407  TROPONINI <0.03   BNP (last 3 results) No results for input(s): PROBNP in the  last 8760 hours. HbA1C:  Recent Labs  12/20/15 0407  HGBA1C 6.2*   CBG: No results for input(s): GLUCAP in the last 168 hours. Lipid Profile: No results for input(s): CHOL, HDL, LDLCALC, TRIG, CHOLHDL, LDLDIRECT in the last 72 hours. Thyroid Function Tests:  Recent Labs  12/20/15 0407  TSH 10.671*   Anemia Panel: No results for input(s): VITAMINB12, FOLATE, FERRITIN, TIBC, IRON, RETICCTPCT in the last 72 hours. Urine analysis:    Component Value Date/Time   COLORURINE YELLOW* 12/20/2015 0439   COLORURINE Colorless 09/23/2011 1739   APPEARANCEUR CLEAR* 12/20/2015 0439   APPEARANCEUR Clear 09/23/2011 1739   LABSPEC 1.014 12/20/2015 0439   LABSPEC 1.003 09/23/2011 1739   PHURINE 5.0  12/20/2015 0439   PHURINE 7.0 09/23/2011 1739   GLUCOSEU NEGATIVE 12/20/2015 0439   GLUCOSEU Negative 09/23/2011 1739   HGBUR 1+* 12/20/2015 0439   HGBUR Negative 09/23/2011 1739   BILIRUBINUR NEGATIVE 12/20/2015 0439   BILIRUBINUR Negative 09/23/2011 [place] 12/20/2015 0439   KETONESUR Negative 09/23/2011 1739   PROTEINUR 30* 12/20/2015 0439   PROTEINUR Negative 09/23/2011 1739   NITRITE NEGATIVE 12/20/2015 0439   NITRITE Negative 09/23/2011 1739   LEUKOCYTESUR NEGATIVE 12/20/2015 0439   LEUKOCYTESUR Negative 09/23/2011 1739   Sepsis Labs: @LABRCNTIP (procalcitonin:4,lacticidven:4) )No results found for this or any previous visit (from the past 240 hour(s)).   Radiological Exams on Admission: Ct Head Wo Contrast  12/20/2015  CLINICAL DATA:  Possible seizure. Shallow abrasions to the top of the head. Baseline dementia. EXAM: CT HEAD WITHOUT CONTRAST TECHNIQUE: Contiguous axial images were obtained from the base of the skull through the vertex without intravenous contrast. COMPARISON:  10/17/2015 FINDINGS: Diffuse cerebral atrophy. Low-attenuation changes in the deep white matter consistent with small vessel ischemia. Ventricular dilatation consistent with central atrophy.  No mass effect or midline shift. No abnormal extra-axial fluid collections. Gray-white matter junctions are distinct. Basal cisterns are not effaced. No evidence of acute intracranial hemorrhage. No depressed skull fractures. Visualized paranasal sinuses and mastoid air cells are not opacified. Focal sclerosis again demonstrated in the right temporal bone without expansion or destruction. IMPRESSION: No acute intracranial abnormalities. Chronic atrophy and small vessel ischemic changes. Electronically Signed   By: Lucienne Capers M.D.   On: 12/20/2015 04:32   Mr Jeri Cos [id number] Contrast  12/20/2015  CLINICAL DATA:  Seizures. EXAM: MRI HEAD WITHOUT AND WITH CONTRAST TECHNIQUE: Multiplanar, multiecho pulse sequences of the brain and surrounding structures were obtained without and with intravenous contrast. CONTRAST:  56mL MULTIHANCE GADOBENATE DIMEGLUMINE 529 MG/ML IV SOLN COMPARISON:  Head CT 12/20/2015 and MRI 05/08/2014 FINDINGS: Multiple sequences are mildly to moderately motion degraded despite the patient receiving anxiolysis, repeat imaging, and utilization of more motion resistant imaging protocols. There are 3 subcentimeter foci of cortical/subcortical diffusion abnormality which are located in the high frontal lobes and right occipital lobe. There is clearly reduced ADC in the right frontal and right occipital lobes. Reduced ADC is not confirmed in the left frontal lobe. No intracranial hemorrhage, mass, midline shift, or extra-axial fluid collection is identified. There are multiple small chronic cortical infarcts in the high frontal lobes which are new/ increased from the prior MRI and could reflect sequelae of watershed ischemia given their orientation versus emboli. Patchy T2 hyperintensities in the frontoparietal white matter have also increased from the prior MRI. A chronic, small cortical infarct in the left occipital lobe is unchanged. Numerous chronic infarcts are again seen throughout both  cerebellar hemispheres as well as in the left thalamus. No abnormal enhancement is identified within limitations of motion artifact. There is moderate cerebral atrophy. Orbits are grossly unremarkable. Paranasal sinuses and mastoid air cells are clear. Major intracranial vascular flow voids are preserved. IMPRESSION: 1. Three small acute/early subacute infarcts in the high frontal lobes and right occipital lobe, emboli versus watershed ischemia. 2. Progressive, mild-to-moderate chronic small vessel ischemic disease including new small chronic cortical infarcts in the high frontal lobes. 3. Remote left occipital and bilateral cerebellar infarcts. Electronically Signed   By: Logan Bores M.D.   On: 12/20/2015 17:36   Dg Chest Port 1 View  12/20/2015  CLINICAL DATA:  Possible seizure. EXAM: PORTABLE CHEST 1 VIEW COMPARISON:  08/18/2012  FINDINGS: Shallow inspiration with mild linear atelectasis in the lung bases. The heart size and mediastinal contours are within normal limits. Both lungs are clear. The visualized skeletal structures are unremarkable. IMPRESSION: Linear atelectasis in the lung bases. Electronically Signed   By: Lucienne Capers M.D.   On: 12/20/2015 04:41    EKG: Independently reviewed.  Assessment/Plan Principal Problem:   Embolic stroke (HCC) Active Problems:   BP (high blood pressure)   Seizure (HCC)   CKD (chronic kidney disease) stage 3, GFR 30-59 ml/min   Fever, unspecified   Embolic stroke -  Stroke pathway  Unclear if thromboembolic vs septic emboli especially given his fever of 101.5  Therefore will hold off on anticoagulants  Will do vanc per pharm consult empirically for possible endocarditis, of note got 2gm rocephin earlier today.  Continue ASA 81 daily while awaiting neuro consult  2d echo ordered  Seizure -  Neurology consulted  Continue Vimpat while awaiting recs  Adult EEG  HTN - hold BP meds and allow permissive HTN  CKD stage 3 - chronic and  baseline     DVT prophylaxis: SCDs Code Status: Full Family Communication: Family at bedside Consults called: Neurology consulted to see patient Admission status: Admit to inpatient   Etta Quill DO [facility] Pager [phone] from 7PM-7AM  If 7AM-7PM, please contact the day physician for the patient [email or web address] Password [facility]  12/20/2015, 8:28 PM

## 2015-12-20 NOTE — ED Notes (Signed)
Pt post-ictal, with heavy breathing, RR 28, currently not responding to voice or pain, skin pale and moist.

## 2015-12-20 NOTE — Progress Notes (Signed)
ANTIBIOTIC CONSULT NOTE - INITIAL  Pharmacy Consult for Vancomycin Indication: r/o endocarditis  No Known Allergies  Patient Measurements:   Vital Signs: Temp: 98.8 F (37.1 C) (05/19 1827) Temp Source: Oral (05/19 1128) BP: 106/62 mmHg (05/19 1827) Pulse Rate: 65 (05/19 1827) Intake/Output from previous day:   Intake/Output from this shift:    Labs:  Recent Labs  12/20/15 0407  WBC 7.9  HGB 14.3  PLT 180  CREATININE 1.80*   Estimated Creatinine Clearance: 41.3 mL/min (by C-G formula based on Cr of 1.8). No results for input(s): VANCOTROUGH, VANCOPEAK, VANCORANDOM, GENTTROUGH, GENTPEAK, GENTRANDOM, TOBRATROUGH, TOBRAPEAK, TOBRARND, AMIKACINPEAK, AMIKACINTROU, AMIKACIN in the last 72 hours.   Microbiology: No results found for this or any previous visit (from the past 720 hour(s)).  Medical History: Past Medical History  Diagnosis Date  . Hypertension   . GERD (gastroesophageal reflux disease)   . Arthritis   . Cancer [facility]) 05/2011    prostate cancer, [facility]  . Memory loss   . TIA (transient ischemic attack)     Assessment:  75 y/o M admitted from [facility] 5/19 with seizure activity. Loaded with Jodi Marble. Experienced continued lethargy post-ictal and had fever in ED 101.5. AED changed to Vimpat at [facility]. MRI suggested possibly embolic event.   PMH: HTN, dementia, seizures, GERD, arthritis, prostate cancer, TIA/CVA, memory loss, CKD3, vertigo  ID: r/o endocarditis with CVA +fever (s/p seizure). Tmax 101.2, currently afebrile. WBC 7.9. Scr 1.8 with estimated CrCl 41.  Goal of Therapy:  Vancomycin trough level 15-20 mcg/ml  Plan:  Vancomycin 1250mg  IV x 1 load Vanco 750mg  IV q12 hrs. Check Vancomycin trough after 3-5 doses at steady state   Hermie Reagor S. Alford Highland, PharmD, BCPS Clinical Staff Pharmacist Pager [phone]  Eilene Ghazi Stillinger 12/20/2015,8:55 PM

## 2015-12-20 NOTE — ED Provider Notes (Addendum)
[facility] Emergency Department Provider Note   ____________________________________________  Time seen: Approximately 4:21 AM  I have reviewed the triage vital signs and the nursing notes.   HISTORY  Chief Complaint Seizures  Limited by dementia  HPI Philip Moreno is a 75 y.o. male brought to the ED from home via EMS with a chief complaint of seizure. Wife states she awoke to patient shaking all over next to her. States his eyes were rolled to the back of his head, he was frothing at the mouth and all limbs were shaking. Episode lasted approximately 1 minute and wife reports post ictal state afterwards. States patient did not bite his tongue nor suffer urinary incontinence. Wife reports patient has a small abrasion to the vertex of his head secondary to striking it on the headboard during his seizure activity. States this is patient's third seizure like activity within the past year. He has been seen by a neurologist with normal EEG and not placed on antiepileptics. Denies recent fever, chills, chest pain, shortness of breath, abdominal pain, nausea, vomiting, diarrhea. Denies recent travel or trauma. Nothing makes his symptoms better or worse.   Past Medical History  Diagnosis Date  . Hypertension   . GERD (gastroesophageal reflux disease)   . Arthritis   . Cancer [facility]) 05/2011    prostate cancer, [facility]  . Memory loss   . TIA (transient ischemic attack)     Patient Active Problem List   Diagnosis Date Noted  . Seizure ([place]) 12/20/2015  . Other specified transient cerebral ischemias 05/02/2014  . Pre-syncope 04/24/2014  . 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 Arthritis 04/24/2014  . BP (high blood pressure) 04/24/2014  . Confusion state 02/28/2014  . Dizziness 02/28/2014  . Convulsions ([place]) 02/28/2014    Past Surgical History  Procedure Laterality Date  . Robot assisted laparoscopic radical prostatectomy  08/25/2012     Procedure: ROBOTIC ASSISTED LAPAROSCOPIC RADICAL PROSTATECTOMY LEVEL 2;  Surgeon: Dutch Gray, MD;  Location: [facility] ORS;  Service: Urology;  Laterality: N/A;  . Lymphadenectomy  08/25/2012    Procedure: LYMPHADENECTOMY;  Surgeon: Dutch Gray, MD;  Location: [facility] ORS;  Service: Urology;  Laterality: Bilateral;  . Prostate surgery  2012  . Colonoscopy with propofol N/A 02/27/2015    Procedure: COLONOSCOPY WITH PROPOFOL;  Surgeon: Christene Lye, MD;  Location: [facility] ENDOSCOPY;  Service: Endoscopy;  Laterality: N/A;    Current Outpatient Rx  Name  Route  Sig  Dispense  Refill  . donepezil (ARICEPT) 5 MG tablet   Oral   Take 1 tablet (5 mg total) by mouth daily.   30 tablet   11   . lisinopril (PRINIVIL,ZESTRIL) 20 MG tablet   Oral   Take 20 mg by mouth daily.         . metoprolol succinate (TOPROL-XL) 100 MG 24 hr tablet   Oral   Take 100 mg by mouth daily. Take with or immediately following a meal.         . QUEtiapine (SEROQUEL) 25 MG tablet   Oral   Take 2 tablets (50 mg total) by mouth at bedtime. Reported on 08/13/2015 Patient taking differently: Take 25 mg by mouth at bedtime. Reported on 08/13/2015   60 tablet   11   . fluticasone (FLONASE) 50 MCG/ACT nasal spray   Each Nare   Place 2 sprays into both nostrils daily.   16 g   2   . memantine (NAMENDA XR) 28  MG CP24 24 hr capsule   Oral   Take 1 capsule (28 mg total) by mouth daily.   30 capsule   11   . QUEtiapine (SEROQUEL) 25 MG tablet      START WITH 1 TABLET BY MOUTH TWICE DAILY, AND MAY INCREASE TO 2 TABLETS TWICE DAILY AS DIRECTED   60 tablet   0     Please ask the patient to contact our office and s ...     Allergies Review of patient's allergies indicates no known allergies.  Family History  Problem Relation Age of Onset  . Hypertension Mother   . Heart attack Father   . Cancer Brother   . Heart failure Brother   . Diabetes Brother     Social History Social History  Substance Use Topics   . Smoking status: Never Smoker   . Smokeless tobacco: Current User    Types: Snuff, Chew  . Alcohol Use: No    Review of Systems  Constitutional: No fever/chills. Eyes: No visual changes. ENT: No sore throat. Cardiovascular: Denies chest pain. Respiratory: Denies shortness of breath. Gastrointestinal: No abdominal pain.  No nausea, no vomiting.  No diarrhea.  No constipation. Genitourinary: Negative for dysuria. Musculoskeletal: Negative for back pain. Skin: Negative for rash. Neurological: Positive for seizure. Negative for headaches, focal weakness or numbness.  10-point ROS otherwise negative.  ____________________________________________   PHYSICAL EXAM:  VITAL SIGNS: ED Triage Vitals  Enc Vitals Group     BP 12/20/15 0406 172/112 mmHg     Pulse Rate 12/20/15 0406 95     Resp 12/20/15 0406 21     Temp 12/20/15 0406 98.2 F (36.8 C)     Temp Source 12/20/15 0406 Oral     SpO2 12/20/15 0406 93 %     Weight 12/20/15 0406 230 lb (104.327 kg)     Height 12/20/15 0406 6\' 1"  (1.854 m)     Head Cir --      Peak Flow --      Pain Score 12/20/15 0407 0     Pain Loc --      Pain Edu? --      Excl. in [place]? --     Constitutional: Alert and oriented. Well appearing and in no acute distress. Eyes: Conjunctivae are normal. PERRL. EOMI. Head: Small abrasion to vertex of head. Nose: No congestion/rhinnorhea. Mouth/Throat: Mucous membranes are moist.  Oropharynx non-erythematous. Neck: No stridor.  No carotid bruits. Cardiovascular: Normal rate, regular rhythm. Grossly normal heart sounds.  Good peripheral circulation. Respiratory: Normal respiratory effort.  No retractions. Lungs CTAB. Gastrointestinal: Soft and nontender. No distention. No abdominal bruits. No CVA tenderness. Musculoskeletal: No lower extremity tenderness nor edema.  No joint effusions. Neurologic:  Alert and oriented x 1 (wife states this is not patient's baseline). Normal speech and language. No gross  focal neurologic deficits are appreciated. MAEx4. Skin:  Skin is warm, dry and intact. No rash noted. Psychiatric: Mood and affect are normal. Speech and behavior are normal.  ____________________________________________   LABS (all labs ordered are listed, but only abnormal results are displayed)  Labs Reviewed  CBC WITH DIFFERENTIAL/PLATELET - Abnormal; Notable for the following:    RBC 5.96 (*)    MCV 74.1 (*)    MCH 24.0 (*)    RDW 18.6 (*)    All other components within normal limits  COMPREHENSIVE METABOLIC PANEL - Abnormal; Notable for the following:    CO2 19 (*)    Glucose, Bld  121 (*)    BUN 24 (*)    Creatinine, Ser 1.80 (*)    GFR calc non Af Amer 36 (*)    GFR calc Af Amer 41 (*)    All other components within normal limits  URINALYSIS COMPLETEWITH MICROSCOPIC ([facility] ONLY) - Abnormal; Notable for the following:    Color, Urine YELLOW (*)    APPearance CLEAR (*)    Hgb urine dipstick 1+ (*)    Protein, ur 30 (*)    All other components within normal limits  TROPONIN I  PROTIME-INR  ETHANOL  URINE DRUG SCREEN, QUALITATIVE ([facility] ONLY)   ____________________________________________  EKG  ED ECG REPORT I, Zykeem Bauserman J, the attending physician, personally viewed and interpreted this ECG.   Date: 12/20/2015  EKG Time: 0403  Rate: 93  Rhythm: normal EKG, normal sinus rhythm  Axis: Normal  Intervals:none  ST&T Change: Nonspecific  ____________________________________________  RADIOLOGY  CT head without contrast interpreted per Dr. Gerilyn Nestle: No acute intracranial abnormalities. Chronic atrophy and small vessel ischemic changes.  Portable chest x-ray (viewed by me, interpreted per Dr. Gerilyn Nestle): Linear atelectasis in the lung bases. ____________________________________________   PROCEDURES  Procedure(s) performed: None  Critical Care performed: No  ____________________________________________   INITIAL IMPRESSION / ASSESSMENT AND PLAN / ED  COURSE  Pertinent labs & imaging results that were available during my care of the patient were reviewed by me and considered in my medical decision making (see chart for details).  75 year old male who presents for seizure; patient is hypertensive on arrival and less oriented than baseline. We'll obtain an emergent CT head, screening lab work and reassess. Anticipate hospital admission.  ----------------------------------------- 4:55 AM on 12/20/2015 -----------------------------------------  Blood pressure 153/101 without intervention. Updated patient and spouse of laboratory and imaging results. Will administer IV hydralazine. Discussed with hospitalists to evaluate in the emergency department for admission.  ----------------------------------------- 5:51 AM on 12/20/2015 -----------------------------------------  Called to bedside for patient actively seizing. Patient have a tonic-clonic seizure, eyes rolled in the back of his head, lasting approximately 1 minute. 1 mg IV Ativan given. 500 mg IV Keppra ordered. ____________________________________________   FINAL CLINICAL IMPRESSION(S) / ED DIAGNOSES  Final diagnoses:  Seizure ([place])  Convulsions, unspecified convulsion type ([place])  Confusion state  Essential hypertension      NEW MEDICATIONS STARTED DURING THIS VISIT:  New Prescriptions   No medications on file     Note:  This document was prepared using Dragon voice recognition software and may include unintentional dictation errors.    Paulette Blanch, MD 12/20/15 [place], MD 12/20/15 [phone]

## 2015-12-20 NOTE — Progress Notes (Signed)
Patient ID: Philip Moreno, male   DOB: Sep 09, 1940, 75 y.o.   MRN: [id number] [employer] PROGRESS NOTE  Philip Moreno [id number] DOB: 17-Oct-1940 DOA: 12/20/2015 PCP: [name], MD  HPI/Subjective: Patient answers questions. Still lethargic. States he feels fair. No cough, no burning on urination, no abdominal pain  Objective: Filed Vitals:   12/20/15 1330 12/20/15 1500  BP:    Pulse:    Temp: 101.2 F (38.4 C) 99.7 F (37.6 C)  Resp:      Filed Weights   12/20/15 0406 12/20/15 0629  Weight: 104.327 kg (230 lb) 93.895 kg (207 lb)    ROS: Review of Systems  Constitutional: Positive for fever. Negative for chills.  Eyes: Negative for blurred vision.  Respiratory: Negative for cough and shortness of breath.   Cardiovascular: Negative for chest pain.  Gastrointestinal: Negative for nausea, vomiting, abdominal pain, diarrhea and constipation.  Genitourinary: Negative for dysuria.  Musculoskeletal: Negative for joint pain.  Neurological: Negative for dizziness and headaches.   Exam: Physical Exam  Constitutional: He appears lethargic.  HENT:  Nose: No mucosal edema.  Mouth/Throat: No oropharyngeal exudate or posterior oropharyngeal edema.  Eyes: Conjunctivae, EOM and lids are normal. Pupils are equal, round, and reactive to light.  Neck: No JVD present. Carotid bruit is not present. No edema present. No thyroid mass and no thyromegaly present.  Cardiovascular: S1 normal and S2 normal.  Exam reveals no gallop.   No murmur heard. Pulses:      Dorsalis pedis pulses are 2+ on the right side, and 2+ on the left side.  Respiratory: No respiratory distress. He has no wheezes. He has no rhonchi. He has no rales.  GI: Soft. Bowel sounds are normal. There is no tenderness.  Musculoskeletal:       Right ankle: He exhibits no swelling.       Left ankle: He exhibits no swelling.  Lymphadenopathy:    He has no cervical adenopathy.  Neurological: He appears lethargic. No  cranial nerve deficit.  Skin: Skin is warm. No rash noted. Nails show no clubbing.  Psychiatric: His speech is not delayed.      Data Reviewed: Basic Metabolic Panel:  Recent Labs Lab 12/20/15 0407  NA 138  K 3.5  CL 108  CO2 19*  GLUCOSE 121*  BUN 24*  CREATININE 1.80*  CALCIUM 9.4   Liver Function Tests:  Recent Labs Lab 12/20/15 0407  AST 29  ALT 30  ALKPHOS 47  BILITOT 0.4  PROT 7.8  ALBUMIN 4.0   CBC:  Recent Labs Lab 12/20/15 0407  WBC 7.9  NEUTROABS 6.0  HGB 14.3  HCT 44.2  MCV 74.1*  PLT 180   Cardiac Enzymes:  Recent Labs Lab 12/20/15 0407  TROPONINI <0.03     Studies: Ct Head Wo Contrast  12/20/2015  CLINICAL DATA:  Possible seizure. Shallow abrasions to the top of the head. Baseline dementia. EXAM: CT HEAD WITHOUT CONTRAST TECHNIQUE: Contiguous axial images were obtained from the base of the skull through the vertex without intravenous contrast. COMPARISON:  10/17/2015 FINDINGS: Diffuse cerebral atrophy. Low-attenuation changes in the deep white matter consistent with small vessel ischemia. Ventricular dilatation consistent with central atrophy. No mass effect or midline shift. No abnormal extra-axial fluid collections. Gray-white matter junctions are distinct. Basal cisterns are not effaced. No evidence of acute intracranial hemorrhage. No depressed skull fractures. Visualized paranasal sinuses and mastoid air cells are not opacified. Focal sclerosis again demonstrated in the right temporal  bone without expansion or destruction. IMPRESSION: No acute intracranial abnormalities. Chronic atrophy and small vessel ischemic changes. Electronically Signed   By: Lucienne Capers M.D.   On: 12/20/2015 04:32   Dg Chest Port 1 View  12/20/2015  CLINICAL DATA:  Possible seizure. EXAM: PORTABLE CHEST 1 VIEW COMPARISON:  08/18/2012 FINDINGS: Shallow inspiration with mild linear atelectasis in the lung bases. The heart size and mediastinal contours are within  normal limits. Both lungs are clear. The visualized skeletal structures are unremarkable. IMPRESSION: Linear atelectasis in the lung bases. Electronically Signed   By: Lucienne Capers M.D.   On: 12/20/2015 04:41    Scheduled Meds: . amLODipine  5 mg Oral Daily  . cefTRIAXone (ROCEPHIN)  IV  1 g Intravenous Q24H  . docusate sodium  100 mg Oral BID  . donepezil  5 mg Oral Daily  . heparin  5,000 Units Subcutaneous Q8H  . levETIRAcetam  500 mg Oral BID  . lisinopril  20 mg Oral Daily  . metoprolol succinate  100 mg Oral Daily  . QUEtiapine  50 mg Oral QHS   Continuous Infusions: . sodium chloride 100 mL/hr at 12/20/15 0657    Assessment/Plan:  1. Seizure with lethargy. Await neurology consultation. EEG ordered but it doesn't look like it was done. Patient started on Keppra. My concern is that he still little lethargic at this point. As per wife history of seizure but does not look like on any medications for this. 2. Fever. Unclear etiology we'll get blood cultures and repeat chest x-ray tomorrow morning. Empirically put on Rocephin. IV fluid hydration. 3. Essential hypertension on amlodipine and lisinopril metoprolol 4. History of memory loss on Aricept  Code Status:     Code Status Orders        Start     Ordered   12/20/15 0633  Full code   Continuous     12/20/15 [id number]    Code Status History    Date Active Date Inactive Code Status Order ID Comments User Context   This patient has a current code status but no historical code status.     Family Communication: Family at bedside Disposition Plan: To be determined  Consultants:  Neurology  Antibiotics:  Rocephin  Time spent: 35 minutes  Jay, [place]

## 2015-12-20 NOTE — ED Notes (Signed)
Floor called, Kennyth Lose, RN given update on pt's condition

## 2015-12-20 NOTE — H&P (Addendum)
Philip Moreno is an 75 y.o. male.   Chief Complaint: Seizure activity HPI: The patient with past medical history of hypertension and dementia presents to the emergency department via EMS following an episode of seizure activity. His last seizure was 3 months ago. He has had approximately 3 episodes of seizure activity in the past but he was not started on antiepileptic medication. The patient's wife states that she was awoken by the patient's convulsions. She describes the patient eyes rolling backward and him foaming from his mouth. The convulsions appeared to be full body biphasic movements although she admits his arms were contracted close to his chest more often than not. He did not lose continence of bowel or bladder. The seizure activity lasted approximately 1 minute. In the emergency department the patient was found to be slightly groggy. Following initial history and physical the patient had another seizure lasting about 1 minute. He was given Ativan prior to admission to the hospitalist service for further neurologic workup.  Past Medical History  Diagnosis Date  . Hypertension   . GERD (gastroesophageal reflux disease)   . Arthritis   . Cancer [facility]) 05/2011    prostate cancer, [facility]  . Memory loss   . TIA (transient ischemic attack)     Past Surgical History  Procedure Laterality Date  . Robot assisted laparoscopic radical prostatectomy  08/25/2012    Procedure: ROBOTIC ASSISTED LAPAROSCOPIC RADICAL PROSTATECTOMY LEVEL 2;  Surgeon: Dutch Gray, MD;  Location: [facility];  Service: Urology;  Laterality: N/A;  . Lymphadenectomy  08/25/2012    Procedure: LYMPHADENECTOMY;  Surgeon: Dutch Gray, MD;  Location: [facility];  Service: Urology;  Laterality: Bilateral;  . Prostate surgery  2012  . Colonoscopy with propofol N/A 02/27/2015    Procedure: COLONOSCOPY WITH PROPOFOL;  Surgeon: Christene Lye, MD;  Location: [facility] ENDOSCOPY;  Service: Endoscopy;  Laterality: N/A;    Family History   Problem Relation Age of Onset  . Hypertension Mother   . Heart attack Father   . Cancer Brother   . Heart failure Brother   . Diabetes Brother    Social History:  reports that he has never smoked. His smokeless tobacco use includes Snuff and Chew. He reports that he does not drink alcohol or use illicit drugs.  Allergies: No Known Allergies  Prior to Admission medications   Medication Sig Start Date End Date Taking? Authorizing Provider  donepezil (ARICEPT) 5 MG tablet Take 1 tablet (5 mg total) by mouth daily. 08/13/15  Yes Melvenia Beam, MD  lisinopril (PRINIVIL,ZESTRIL) 20 MG tablet Take 20 mg by mouth daily.   Yes Historical Provider, MD  metoprolol succinate (TOPROL-XL) 100 MG 24 hr tablet Take 100 mg by mouth daily. Take with or immediately following a meal.   Yes Historical Provider, MD  QUEtiapine (SEROQUEL) 25 MG tablet Take 2 tablets (50 mg total) by mouth at bedtime. Reported on 08/13/2015 Patient taking differently: Take 25 mg by mouth at bedtime. Reported on 08/13/2015 08/13/15  Yes Melvenia Beam, MD  fluticasone [facility]) 50 MCG/ACT nasal spray Place 2 sprays into both nostrils daily. 03/01/15   Jan Fireman, PA-C  memantine (NAMENDA XR) 28 MG CP24 24 hr capsule Take 1 capsule (28 mg total) by mouth daily. 08/13/15   Melvenia Beam, MD  QUEtiapine (SEROQUEL) 25 MG tablet START WITH 1 TABLET BY MOUTH TWICE DAILY, AND MAY INCREASE TO 2 TABLETS TWICE DAILY AS DIRECTED 06/28/15   Melvenia Beam, MD  Results for orders placed or performed during the hospital encounter of 12/20/15 (from the past 48 hour(s))  CBC with Differential     Status: Abnormal   Collection Time: 12/20/15  4:07 AM  Result Value Ref Range   WBC 7.9 3.8 - 10.6 K/uL   RBC 5.96 (H) 4.40 - 5.90 MIL/uL   Hemoglobin 14.3 13.0 - 18.0 g/dL   HCT 44.2 40.0 - 52.0 %   MCV 74.1 (L) 80.0 - 100.0 fL   MCH 24.0 (L) 26.0 - 34.0 pg   MCHC 32.4 32.0 - 36.0 g/dL   RDW 18.6 (H) 11.5 - 14.5 %   Platelets 180 150 -  440 K/uL   Neutrophils Relative % 76% %   Neutro Abs 6.0 1.4 - 6.5 K/uL   Lymphocytes Relative 13% %   Lymphs Abs 1.0 1.0 - 3.6 K/uL   Monocytes Relative 10% %   Monocytes Absolute 0.8 0.2 - 1.0 K/uL   Eosinophils Relative 1% %   Eosinophils Absolute 0.1 0 - 0.7 K/uL   Basophils Relative 0% %   Basophils Absolute 0.0 0 - 0.1 K/uL  Comprehensive metabolic panel     Status: Abnormal   Collection Time: 12/20/15  4:07 AM  Result Value Ref Range   Sodium 138 135 - 145 mmol/L   Potassium 3.5 3.5 - 5.1 mmol/L   Chloride 108 101 - 111 mmol/L   CO2 19 (L) 22 - 32 mmol/L   Glucose, Bld 121 (H) 65 - 99 mg/dL   BUN 24 (H) 6 - 20 mg/dL   Creatinine, Ser 1.80 (H) 0.61 - 1.24 mg/dL   Calcium 9.4 8.9 - 10.3 mg/dL   Total Protein 7.8 6.5 - 8.1 g/dL   Albumin 4.0 3.5 - 5.0 g/dL   AST 29 15 - 41 U/L   ALT 30 17 - 63 U/L   Alkaline Phosphatase 47 38 - 126 U/L   Total Bilirubin 0.4 0.3 - 1.2 mg/dL   GFR calc non Af Amer 36 (L) >60 mL/min   GFR calc Af Amer 41 (L) >60 mL/min    Comment: (NOTE) The eGFR has been calculated using the CKD EPI equation. This calculation has not been validated in all clinical situations. eGFR's persistently <60 mL/min signify possible Chronic Kidney Disease.    Anion gap 11 5 - 15  Troponin I     Status: None   Collection Time: 12/20/15  4:07 AM  Result Value Ref Range   Troponin I <0.03 <0.031 ng/mL    Comment:        NO INDICATION OF MYOCARDIAL INJURY.   Protime-INR     Status: None   Collection Time: 12/20/15  4:07 AM  Result Value Ref Range   Prothrombin Time 14.7 11.4 - 15.0 seconds   INR 1.13   Ethanol     Status: None   Collection Time: 12/20/15  4:07 AM  Result Value Ref Range   Alcohol, Ethyl (B) <5 <5 mg/dL    Comment:        LOWEST DETECTABLE LIMIT FOR SERUM ALCOHOL IS 5 mg/dL FOR MEDICAL PURPOSES ONLY   Urinalysis complete, with microscopic ([facility] only)     Status: Abnormal   Collection Time: 12/20/15  4:39 AM  Result Value Ref Range    Color, Urine YELLOW (A) YELLOW   APPearance CLEAR (A) CLEAR   Glucose, UA NEGATIVE NEGATIVE mg/dL   Bilirubin Urine NEGATIVE NEGATIVE   Ketones, ur NEGATIVE NEGATIVE mg/dL   Specific Gravity,  Urine 1.014 1.005 - 1.030   Hgb urine dipstick 1+ (A) NEGATIVE   pH 5.0 5.0 - 8.0   Protein, ur 30 (A) NEGATIVE mg/dL   Nitrite NEGATIVE NEGATIVE   Leukocytes, UA NEGATIVE NEGATIVE   RBC / HPF 0-5 0 - 5 RBC/hpf   WBC, UA 0-5 0 - 5 WBC/hpf   Bacteria, UA NONE SEEN NONE SEEN   Squamous Epithelial / LPF NONE SEEN NONE SEEN  Urine Drug Screen, Qualitative ([facility] only)     Status: None   Collection Time: 12/20/15  4:39 AM  Result Value Ref Range   Tricyclic, Ur Screen NONE DETECTED NONE DETECTED   Amphetamines, Ur Screen NONE DETECTED NONE DETECTED   MDMA (Ecstasy)Ur Screen NONE DETECTED NONE DETECTED   Cocaine Metabolite,Ur [place] NONE DETECTED NONE DETECTED   Opiate, Ur Screen NONE DETECTED NONE DETECTED   Phencyclidine (PCP) Ur S NONE DETECTED NONE DETECTED   Cannabinoid 50 Ng, Ur [place] NONE DETECTED NONE DETECTED   Barbiturates, Ur Screen NONE DETECTED NONE DETECTED   Benzodiazepine, Ur Scrn NONE DETECTED NONE DETECTED   Methadone Scn, Ur NONE DETECTED NONE DETECTED    Comment: (NOTE) 161  Tricyclics, urine               Cutoff 1000 ng/mL 200  Amphetamines, urine             Cutoff 1000 ng/mL 300  MDMA (Ecstasy), urine           Cutoff 500 ng/mL 400  Cocaine Metabolite, urine       Cutoff 300 ng/mL 500  Opiate, urine                   Cutoff 300 ng/mL 600  Phencyclidine (PCP), urine      Cutoff 25 ng/mL 700  Cannabinoid, urine              Cutoff 50 ng/mL 800  Barbiturates, urine             Cutoff 200 ng/mL 900  Benzodiazepine, urine           Cutoff 200 ng/mL 1000 Methadone, urine                Cutoff 300 ng/mL 1100 1200 The urine drug screen provides only a preliminary, unconfirmed 1300 analytical test result and should not be used for non-medical 1400 purposes. Clinical consideration and  professional judgment should 1500 be applied to any positive drug screen result due to possible 1600 interfering substances. A more specific alternate chemical method 1700 must be used in order to obtain a confirmed analytical result.  1800 Gas chromato graphy / mass spectrometry (GC/MS) is the preferred 1900 confirmatory method.    Ct Head Wo Contrast  12/20/2015  CLINICAL DATA:  Possible seizure. Shallow abrasions to the top of the head. Baseline dementia. EXAM: CT HEAD WITHOUT CONTRAST TECHNIQUE: Contiguous axial images were obtained from the base of the skull through the vertex without intravenous contrast. COMPARISON:  10/17/2015 FINDINGS: Diffuse cerebral atrophy. Low-attenuation changes in the deep white matter consistent with small vessel ischemia. Ventricular dilatation consistent with central atrophy. No mass effect or midline shift. No abnormal extra-axial fluid collections. Gray-white matter junctions are distinct. Basal cisterns are not effaced. No evidence of acute intracranial hemorrhage. No depressed skull fractures. Visualized paranasal sinuses and mastoid air cells are not opacified. Focal sclerosis again demonstrated in the right temporal bone without expansion or destruction. IMPRESSION: No acute intracranial abnormalities. Chronic atrophy  and small vessel ischemic changes. Electronically Signed   By: Lucienne Capers M.D.   On: 12/20/2015 04:32   Dg Chest Port 1 View  12/20/2015  CLINICAL DATA:  Possible seizure. EXAM: PORTABLE CHEST 1 VIEW COMPARISON:  08/18/2012 FINDINGS: Shallow inspiration with mild linear atelectasis in the lung bases. The heart size and mediastinal contours are within normal limits. Both lungs are clear. The visualized skeletal structures are unremarkable. IMPRESSION: Linear atelectasis in the lung bases. Electronically Signed   By: Lucienne Capers M.D.   On: 12/20/2015 04:41    Review of Systems  Constitutional: Negative for fever and chills.  HENT:  Negative for sore throat and tinnitus.   Eyes: Negative for blurred vision and redness.  Respiratory: Negative for cough and shortness of breath.   Cardiovascular: Negative for chest pain, palpitations, orthopnea and PND.  Gastrointestinal: Negative for nausea, vomiting, abdominal pain and diarrhea.  Genitourinary: Negative for dysuria, urgency and frequency.  Musculoskeletal: Negative for myalgias and joint pain.  Skin: Negative for rash.       No lesions  Neurological: Negative for speech change, focal weakness and weakness.  Endo/Heme/Allergies: Does not bruise/bleed easily.       No temperature intolerance  Psychiatric/Behavioral: Negative for depression and suicidal ideas.    Blood pressure 166/92, pulse 111, temperature 98.2 F (36.8 C), temperature source Oral, resp. rate 34, height '6\' 1"'  (1.854 m), weight 104.327 kg (230 lb), SpO2 97 %. Physical Exam  Constitutional: He is oriented to person, place, and time. He appears well-developed and well-nourished. No distress.  HENT:  Head: Normocephalic and atraumatic.  Mouth/Throat: Oropharynx is clear and moist.  Eyes: Conjunctivae and EOM are normal. Pupils are equal, round, and reactive to light. No scleral icterus.  Neck: Normal range of motion. Neck supple. No JVD present. No tracheal deviation present. No thyromegaly present.  Cardiovascular: Normal rate and regular rhythm.  Exam reveals no gallop and no friction rub.   No murmur heard. Respiratory: Effort normal and breath sounds normal. No respiratory distress.  GI: Bowel sounds are normal. He exhibits no distension. There is no tenderness.  Genitourinary:  Deferred  Musculoskeletal: Normal range of motion. He exhibits no edema.  Lymphadenopathy:    He has no cervical adenopathy.  Neurological: He is alert and oriented to person, place, and time. No cranial nerve deficit.  Skin: Skin is warm and dry. No rash noted. No erythema.  Psychiatric: He has a normal mood and affect.  His behavior is normal. Judgment and thought content normal.     Assessment/Plan This is a 75 year old male admitted for recurrent seizure activity. 1. Seizure: The patient has had 2 seizures tonight. Prior to this time it was not felt that the patient had organic seizure disorder, per the patient's wife. He has not been on antiepileptic medication. Rather his symptoms were assumed to be associated with uncontrolled hypertension. Following the second seizure in the emergency department the patient has been loaded with Keppra. Neurology consultation ordered. Seizure precautions in place. 2. Hypertension: Uncontrolled. The patient's wife has explained to me that she has been giving his antihypertensive medication incorrectly. I have clarified his intended home regimen of antihypertensive medication for Hospital use. I have added amlodipine 5 mg. Differential diagnosis of seizure activity includes PRESS syndrome and/or hypertensive encephalopathy. Continue metoprolol and lisinopril 3. Dementia: Stable; continue Aricept.  4. Chronic kidney disease: Stage III; uncontrolled hypertension. Avoid nephrotoxic agents. 5. DVT prophylaxis: Heparin 6. GI prophylaxis: None The patient is a full  code. Time spent on admission orders and patient care approximately 45 minutes  Harrie Foreman, MD 12/20/2015, 6:24 AM

## 2015-12-20 NOTE — Progress Notes (Signed)
Patient temp spiked to 100.2 from 97.8 Dr Leslye Peer notified. Acetaminophen suppository given will continue to monitor.

## 2015-12-20 NOTE — Discharge Instructions (Signed)

## 2015-12-20 NOTE — ED Notes (Signed)
Wife screamed out to say patient was having a seizure. Into room and found patient having tonic-clonic movements involving all extremities, eyes rolled back and excessive salivation noted. NRB placed. Wife reassured. Dr. Beather Arbour paged to bedside.

## 2015-12-21 ENCOUNTER — Encounter (HOSPITAL_COMMUNITY): Payer: Self-pay

## 2015-12-21 ENCOUNTER — Inpatient Hospital Stay (HOSPITAL_COMMUNITY): Payer: Medicare Other

## 2015-12-21 DIAGNOSIS — I6350 Cerebral infarction due to unspecified occlusion or stenosis of unspecified cerebral artery: Secondary | ICD-10-CM

## 2015-12-21 DIAGNOSIS — I639 Cerebral infarction, unspecified: Secondary | ICD-10-CM | POA: Diagnosis present

## 2015-12-21 DIAGNOSIS — I6789 Other cerebrovascular disease: Secondary | ICD-10-CM

## 2015-12-21 LAB — MRSA PCR SCREENING: MRSA by PCR: POSITIVE — AB

## 2015-12-21 LAB — ECHOCARDIOGRAM COMPLETE
HEIGHTINCHES: 73 in
Weight: 3315.72 oz

## 2015-12-21 LAB — LIPID PANEL
CHOL/HDL RATIO: 2.6 ratio
Cholesterol: 94 mg/dL (ref 0–200)
HDL: 36 mg/dL — ABNORMAL LOW (ref >40)
LDL Cholesterol: 48 mg/dL (ref 0–99)
TRIGLYCERIDES: 50 mg/dL (ref <150)
VLDL: 10 mg/dL (ref 0–40)

## 2015-12-21 MED ORDER — CLOPIDOGREL BISULFATE 75 MG PO TABS
75.0000 mg | ORAL_TABLET | Freq: Every day | ORAL | Status: DC
  Administered 2015-12-22 – 2015-12-24 (×3): 75 mg via ORAL
  Filled 2015-12-21 (×3): qty 1

## 2015-12-21 MED ORDER — ENOXAPARIN SODIUM 40 MG/0.4ML ~~LOC~~ SOLN
40.0000 mg | SUBCUTANEOUS | Status: DC
  Administered 2015-12-21 – 2015-12-23 (×3): 40 mg via SUBCUTANEOUS
  Filled 2015-12-21 (×4): qty 0.4

## 2015-12-21 NOTE — Progress Notes (Signed)
EEG Completed; Results Pending  

## 2015-12-21 NOTE — Progress Notes (Signed)
  Echocardiogram 2D Echocardiogram has been performed.  Diamond Nickel 12/21/2015, 3:18 PM

## 2015-12-21 NOTE — Evaluation (Signed)
Speech Language Pathology Evaluation Patient Details Name: Philip Moreno MRN: [id number] DOB: 30-May-1941 Today's Date: 12/21/2015 Time: [id number] SLP Time Calculation (min) (ACUTE ONLY): 23 min  Problem List:  Patient Active Problem List   Diagnosis Date Noted  . Seizure ([place]) 12/20/2015  . Embolic stroke ([place]) [id number]  . CKD (chronic kidney disease) stage 3, GFR 30-59 ml/min 12/20/2015  . Fever, unspecified 12/20/2015  . Other specified transient cerebral ischemias 05/02/2014  . Pre-syncope 04/24/2014  . 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 Arthritis 04/24/2014  . BP (high blood pressure) 04/24/2014  . Confusion state 02/28/2014  . Dizziness 02/28/2014  . Convulsions ([place]) 02/28/2014   Past Medical History:  Past Medical History  Diagnosis Date  . Hypertension   . GERD (gastroesophageal reflux disease)   . Arthritis   . Cancer [facility]) 05/2011    prostate cancer, [facility]  . Memory loss   . TIA (transient ischemic attack)    Past Surgical History:  Past Surgical History  Procedure Laterality Date  . Robot assisted laparoscopic radical prostatectomy  08/25/2012    Procedure: ROBOTIC ASSISTED LAPAROSCOPIC RADICAL PROSTATECTOMY LEVEL 2;  Surgeon: Dutch Gray, MD;  Location: [facility];  Service: Urology;  Laterality: N/A;  . Lymphadenectomy  08/25/2012    Procedure: LYMPHADENECTOMY;  Surgeon: Dutch Gray, MD;  Location: [facility];  Service: Urology;  Laterality: Bilateral;  . Prostate surgery  2012  . Colonoscopy with propofol N/A 02/27/2015    Procedure: COLONOSCOPY WITH PROPOFOL;  Surgeon: Christene Lye, MD;  Location: [facility] ENDOSCOPY;  Service: Endoscopy;  Laterality: N/A;   HPI:  Philip Moreno is a 75 y.o. male with medical history significant of HTN, dementia, prior TIAs and cerebellar strokes. Patient presented to the ED at [facility] this morning after an episode of seizure activity. Patient has had 3 episodes of seizure activity in past but wasn't  started on seizure meds. 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 Patient was admitted to [facility] where he had prolonged post-ictal state and so was transferred [facility].   Assessment / Plan / Recommendation Clinical Impression  Pt's wife is at the bedside. She was answer many questions about patients premorbid state with respect to language and cognition. Today (much improved from yesterday) pt is able to communicate all his wants and needs to staff and family. Pt is appropriately following simple commands and participating in care. His wife reports she hasn't noticed any changes in language abilities other than the forgetfullness he has been having prior to this hospitalization (Pt premorbidly has dementia). Pt is felt to have no new deficits due to his stroke. Education was done with wife on memory strategies that may be helpful as they transition back home. Speech therapy is not recommended at this time.     SLP Assessment  Patient does not need any further Speech Lanaguage Pathology Services    Follow Up Recommendations       Frequency and Duration           SLP Evaluation Prior Functioning  Cognitive/Linguistic Baseline: Baseline deficits Baseline deficit details: [place] Type of Home: House  Lives With: Spouse Available Help at Discharge: Family Vocation: Retired   Associate Professor  Overall Cognitive Status: History of cognitive impairments - at baseline Orientation Level: Oriented to person;Oriented to time;Disoriented to situation;Oriented to place Attention: Alternating Memory: Impaired Memory Impairment: Decreased short term memory Problem Solving: Appears intact Behaviors: Verbal agitation Safety/Judgment: Appears intact    Comprehension  Auditory Comprehension Overall Auditory Comprehension: Appears within functional  limits for tasks assessed Yes/No Questions: Within Functional Limits Commands: Within Functional Limits Conversation: Simple EffectiveTechniques: Extra processing time     Expression Expression Primary Mode of Expression: Verbal Verbal Expression Initiation: No impairment Written Expression Dominant Hand: Right   Oral / Motor  Motor Speech Overall Motor Speech: Appears within functional limits for tasks assessed Respiration: Within functional limits Phonation: Normal Resonance: Within functional limits Articulation: Within functional limitis Intelligibility: Intelligible Motor Planning: Witnin functional limits Motor Speech Errors: Not applicable Interfering Components: Premorbid status   GO          Functional Assessment Tool Used: Bedside Swallow Evaluation        Charlynne Cousins Katriana Dortch, MA, CCC-SLP 12/21/2015 1:41 PM

## 2015-12-21 NOTE — Progress Notes (Signed)
Pt transferred to 3s11 from [facility] ED.  Pt has since become more alert, but is oriented to self only consistently.  Pt has been instructed on how to use call bell and bed alarm is on.  Will continue to monitor.

## 2015-12-21 NOTE — Progress Notes (Signed)
Dr. Darrick Meigs text paged MRSA positive nasal swab result.

## 2015-12-21 NOTE — Evaluation (Signed)
Physical Therapy Evaluation Patient Details Name: Philip Moreno MRN: [id number] DOB: Sep 14, 1940 Today's Date: 12/21/2015   History of Present Illness  75 y.o. male admitted to [facility] on 12/20/15 for AMS, seizure activity.  MRI revealed 3 small areas of acute/subacute ischemic stroke (occipital and frontal lobes) suggestive of emolic event.  Pt also spiked a fever of 101.5 medical workup pending and EEG results pending.  Pt with significant PMHx of HTN, memory loss, TIA, and per family gout.    Clinical Impression  Difficult to assess what deficits today were from stroke (some balance issues, but holding foot off of floor) vs. His significant pain in his right mid foot (wife reports h/o gout).  He is very unsteady on his feet even with RW use and was only able to go bed to chair today.  He is unable to return home where he was essentially supervision for safety and independent with gait and ADLs PTA.   PT to follow acutely for deficits listed below.       Follow Up Recommendations CIR    Equipment Recommendations  Rolling walker with 5" wheels;Wheelchair (measurements PT);Wheelchair cushion (measurements PT);3in1 (PT)    Recommendations for Other Services Rehab consult     Precautions / Restrictions Precautions Precautions: Fall Precaution Comments: Pt is very unsteady on his feet and even more so because he is trying to keep weight off of right painful foot.       Mobility  Bed Mobility Overal bed mobility: Needs Assistance Bed Mobility: Supine to Sit     Supine to sit: Supervision     General bed mobility comments: supervision for safety due to pt scooting too close to EOB   Transfers Overall transfer level: Needs assistance   Transfers: Sit to/from Stand Sit to Stand: Mod assist;From elevated surface         General transfer comment: Mod assist to support trunk for balance and to stabilize RW.  Verbal cues to not stand before PT is ready.   Ambulation/Gait              General Gait Details: unable due to balance deficits and difficulty WB due to pain in right foot.       Modified Rankin (Stroke Patients Only) Modified Rankin (Stroke Patients Only) Pre-Morbid Rankin Score: No symptoms Modified Rankin: Moderately severe disability     Balance Overall balance assessment: Needs assistance Sitting-balance support: Feet supported;Single extremity supported Sitting balance-Leahy Scale: Fair     Standing balance support: Bilateral upper extremity supported Standing balance-Leahy Scale: Poor                               Pertinent Vitals/Pain Pain Assessment: Faces Faces Pain Scale: Hurts whole lot Pain Location: right mid foot Pain Descriptors / Indicators: Grimacing;Guarding (antalgic gait pattern) Pain Intervention(s): Limited activity within patient's tolerance;Monitored during session;Repositioned;Other (comment) (RN made aware)    Home Living Family/patient expects to be discharged to:: Private residence Living Arrangements: Spouse/significant other Available Help at Discharge: Family Type of Home: House Home Access: Stairs to enter   Technical brewer of Steps: [place]: One level        Prior Function Level of Independence: Independent         Comments: per wife he did not use RW or any assitive device at home.  He did all of his ADLs she just has his closet color coordinated so it is easier for  him to get dressed.         Extremity/Trunk Assessment   Upper Extremity Assessment: Overall WFL for tasks assessed           Lower Extremity Assessment: RLE deficits/detail RLE Deficits / Details: right mid foot painful and pt unable to hardly tolerate sock application or seated pressure with foot on the floor.  He essentially held it up in standing and only did TDWB to get from bed to chair with RW.     Cervical / Trunk Assessment: Normal  Communication   Communication: No difficulties  Cognition  Arousal/Alertness: Awake/alert Behavior During Therapy: WFL for tasks assessed/performed Overall Cognitive Status: History of cognitive impairments - at baseline                               Assessment/Plan    PT Assessment Patient needs continued PT services  PT Diagnosis Difficulty walking;Abnormality of gait;Generalized weakness;Acute pain   PT Problem List Decreased strength;Decreased activity tolerance;Decreased balance;Decreased mobility;Decreased knowledge of use of DME;Decreased knowledge of precautions;Pain  PT Treatment Interventions DME instruction;Gait training;Stair training;Therapeutic activities;Functional mobility training;Balance training;Therapeutic exercise;Neuromuscular re-education;Cognitive remediation;Patient/family education;Wheelchair mobility training;Modalities   PT Goals (Current goals can be found in the Care Plan section) Acute Rehab PT Goals Patient Stated Goal: everyone involved would like for him to go home and return to PLOF PT Goal Formulation: With patient/family Time For Goal Achievement: 01/04/16 Potential to Achieve Goals: Good    Frequency Min 3X/week   Barriers to discharge Decreased caregiver support wife is not able to physically care for him at his current state of mobility       End of Session Equipment Utilized During Treatment: Gait belt Activity Tolerance: No increased pain Patient left: in chair;with call bell/phone within reach;with chair alarm set;with family/visitor present Nurse Communication: Mobility status         Time: [id number] PT Time Calculation (min) (ACUTE ONLY): 42 min   Charges:   PT Evaluation $PT Eval Moderate Complexity: 1 Procedure PT Treatments $Therapeutic Activity: 23-37 mins        Philip Moreno B. [place], [place], DPT [phone]   12/21/2015, 1:09 PM

## 2015-12-21 NOTE — Progress Notes (Signed)
Only complaint of pain patient has is to the right lateral foot.  No redness, swelling, or obvious deformity noted.  +2-+3 dorsalis pedis pulse present.  Pt stated he has had gout before.  States tylenol didn't really help his pain, but was able to tolerated well touching and pressing on foot.

## 2015-12-21 NOTE — Progress Notes (Signed)
Dr. Darrick Meigs notified we need an admission order for patient.

## 2015-12-21 NOTE — Progress Notes (Signed)
STROKE TEAM PROGRESS NOTE   HISTORY OF PRESENT ILLNESS (per record) 75 yo with Alzheimer's dementia transferred here from [facility]. He is a poor historian and wife is not here. According to records, he had one GTCS at home and another GTCS in ER. He was given Ativan and placed on Vimpat after a loading dose of 200 mg qd. He is at baseline right now.  I reviewed his MRI Brain personally which shows 2 tiny frontal subcortical infarcts on 1 tiny right posterior temporal infarct on DWI. He also has moderate small vessel ischemic disease periventricularly.  He had a fever at outside hospital and there is a question of endocarditis with septic embolism. Blood culture is pending. UDS was negative.   SUBJECTIVE (INTERVAL HISTORY) His wife is at the bedside.  Overall he feels his condition is rapidly improving. He had EEG and MRA done this am. MRA was motion degraded. Wife stated that he had 3 seizures in the past, recent one 3 months ago. However, yesterday seizure was the worst. Postictal phase is much longer than previous ones. He does have dementia on aricept and namenda. Wife said he felt warm in [facility] but not know the Temp. However, he has no fever here.    OBJECTIVE Temp:  [97.7 F (36.5 C)-101.2 F (38.4 C)] 97.7 F (36.5 C) (05/20 0703) Pulse Rate:  [56-117] 61 (05/20 0703) Cardiac Rhythm:  [-] Sinus bradycardia (05/20 0334) Resp:  [16-20] 19 (05/20 0703) BP: (106-153)/(62-110) 138/91 mmHg (05/20 0703) SpO2:  [94 %-98 %] 94 % (05/20 0703) FiO2 (%):  [36 %] 36 % (05/19 1750) Weight:  [94 kg (207 lb 3.7 oz)] 94 kg (207 lb 3.7 oz) (05/19 2100)  CBC:  Recent Labs Lab 12/20/15 0407  WBC 7.9  NEUTROABS 6.0  HGB 14.3  HCT 44.2  MCV 74.1*  PLT 99991111    Basic Metabolic Panel:  Recent Labs Lab 12/20/15 0407 12/20/15 1600  NA 138  --   K 3.5  --   CL 108  --   CO2 19*  --   GLUCOSE 121*  --   BUN 24*  --   CREATININE 1.80*  --   CALCIUM 9.4  --   MG  --  2.1  PHOS  --  3.9     Lipid Panel:    Component Value Date/Time   CHOL 94 12/21/2015 0354   TRIG 50 12/21/2015 0354   HDL 36* 12/21/2015 0354   CHOLHDL 2.6 12/21/2015 0354   VLDL 10 12/21/2015 0354   LDLCALC 48 12/21/2015 0354   HgbA1c:  Lab Results  Component Value Date   HGBA1C 6.2* 12/20/2015   Urine Drug Screen:    Component Value Date/Time   LABOPIA NONE DETECTED 12/20/2015 0439   COCAINSCRNUR NONE DETECTED 12/20/2015 0439   LABBENZ NONE DETECTED 12/20/2015 0439   AMPHETMU NONE DETECTED 12/20/2015 0439   THCU NONE DETECTED 12/20/2015 0439   LABBARB NONE DETECTED 12/20/2015 0439      IMAGING I have personally reviewed the radiological images below and agree with the radiology interpretations.  Ct Head Wo Contrast 12/20/2015   No acute intracranial abnormalities. Chronic atrophy and small vessel ischemic changes.   Mr Kizzie Fantasia Contrast 12/20/2015   1. Three small acute/early subacute infarcts in the high frontal lobes and right occipital lobe, emboli versus watershed ischemia.  2. Progressive, mild-to-moderate chronic small vessel ischemic disease including new small chronic cortical infarcts in the high frontal lobes.  3. Remote left occipital  and bilateral cerebellar infarcts.   Dg Chest Port 1 View 12/20/2015   Linear atelectasis in the lung bases.   EEG - pending  Mra Head Wo Contrast 12/21/2015  IMPRESSION: 1. The study is severely degraded by patient motion through the cavernous internal carotid arteries and basilar artery. No comments can be made on these areas. 2. The A1 and M1 segments and proximal posterior cerebral arteries are intact. 3. Moderate attenuation of MCA and distal PCA branch vessels may be exaggerated by patient motion.     PHYSICAL EXAM  Temp:  [97.7 F (36.5 C)-99.7 F (37.6 C)] 97.8 F (36.6 C) (05/20 1130) Pulse Rate:  [56-65] 65 (05/20 1105) Resp:  [16-22] 22 (05/20 1105) BP: (106-152)/(62-91) 152/88 mmHg (05/20 1105) SpO2:  [94 %-98 %] 95 %  (05/20 1105) FiO2 (%):  [36 %] 36 % (05/19 1750) Weight:  [207 lb 3.7 oz (94 kg)] 207 lb 3.7 oz (94 kg) (05/19 2100)  General - Well nourished, well developed, in no apparent distress, sitting in chair.  Ophthalmologic - Fundi not visualized due to noncooperation.  Cardiovascular - Regular rate and rhythm.  Mental Status -  Level of arousal and orientation to place, and person were intact, but not orientated to time. Language including expression, naming, repetition, comprehension was assessed and found intact, mild dysarthria. Fund of Knowledge was assessed and was impaired.  Cranial Nerves II - XII - II - Visual field intact OU. III, IV, VI - Extraocular movements intact. V - Facial sensation intact bilaterally. VII - Facial movement intact bilaterally. VIII - Hearing & vestibular intact bilaterally. X - Palate elevates symmetrically, mild dysarthria. XI - Chin turning & shoulder shrug intact bilaterally. XII - Tongue protrusion intact.  Motor Strength - The patient's strength was normal in all extremities except right DF difficulty due to pain in the ankle due to gout and pronator drift was absent.  Bulk was normal and fasciculations were absent.   Motor Tone - Muscle tone was assessed at the neck and appendages and was normal.  Reflexes - The patient's reflexes were 1+ in all extremities and he had no pathological reflexes.  Sensory - Light touch, temperature/pinprick were assessed and were symmetrical.    Coordination - The patient had normal movements in the hands with no ataxia or dysmetria.  Tremor was absent.  Gait and Station - deferred due to safety concerns.   ASSESSMENT/PLAN Mr. SIDDH CRENSHAW is a 76 y.o. male with history of hypertension, dementia, prostate cancer, previous TIA presenting with new onset seizures.  He did not receive IV t-PA due to unknown time of onset.  Seizure  Had 3 episodes in the past, no AED initiated  Loaded with vimpat and now on  maintenance vimpat 100mg  bid  EEG pending  Continue vimpat  MRI with and without contrast did not show tumor or structural lesion to explain seizure  May be related to dementia  Follow up with Dr. Jaynee Eagles as outpt  Stroke:  Bilateral MCA/ACA, and right MCA/PCA watershed infarcts, embolic vs hypoperfusion/hypoxia. This can be related to hypoxia during seizure or cardioembolic. We recommend BP control and 30 day cardiac event monitoring.  Resultant  Back to baseline  MRI  Three small acute/early subacute infarcts in the high frontal lobes and right occipital lobe, watershed distribution. Old cerebellar punctate infarcts bilaterally with questionable old left MCA/PCA infarct.   MRA - motion degraded with no proximal LVO  Carotid Doppler - pending  Blood cultures - pending  2D Echo pending  Recommend 30 day cardiac event monitoring as outpt to rule out afib  LDL - 48  HgbA1c pending  VTE prophylaxis - SCDs  Diet regular Room service appropriate?: Yes; Fluid consistency:: Thin  aspirin 81 mg daily prior to admission, now on aspirin 81 mg daily. Recommend to change to plavix for stroke prevention  Patient counseled to be compliant with his antithrombotic medications  Ongoing aggressive stroke risk factor management  Therapy recommendations: CIR  Disposition:  Pending  Hypertension  BP - Mildly low at times  Permissive hypertension (OK if < 220/120) but gradually normalize in 5-7 days  Other Stroke Risk Factors  Advanced age  Hx stroke/TIA - MRI showed old infarcts as above  Other Active Problems  Renal insufficiency / dehydration  Alzheimer's dementia - on aricept and namenda  Reported one time fever OSH, not sure about Temp, no fever or leukocytosis here - do not feel TEE needed unless blood culture positive - not feel vancomycin is needed - Blood cultures pending.  F/U Dr. Jaynee Eagles after discharge.   Hospital day # 1  Rosalin Hawking, MD PhD Stroke  Neurology 12/21/2015 3:02 PM      To contact Stroke Continuity provider, please refer to [email or web address] After hours, contact General Neurology

## 2015-12-21 NOTE — Progress Notes (Addendum)
[facility]  PROGRESS NOTE  Philip Moreno [id number] DOB: 1941/03/19 DOA: 12/20/2015 PCP: [name], MD    Brief HPI:  75 y.o. male with medical history significant of HTN, dementia, prior TIAs and cerebellar strokes. Patient presented to the ED at [facility] this morning after an episode of seizure activity. Patient has had 3 episodes of seizure activity in past but wasn't started on seizure meds. 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 Patient was admitted to [facility] where he had prolonged post-ictal state and so was [facility]. MRI done at [facility] revealed that patient appears to have 3 small areas of acute / subacute ischemic stroke. Occipital and frontal lobe, they suggest possible embolic event. Patient also spiked a fever of 101.5 this afternoon. Blood cultures were drawn and patient given 2gm rocephin prior to transfer.  Principal Problem:   Embolic stroke ([place]) Active Problems:   BP (high blood pressure)   Seizure (HCC)   CKD (chronic kidney disease) stage 3, GFR 30-59 ml/min   Fever, unspecified   CVA (cerebral infarction)   Assessment/Plan: 1. Embolic stroke- MRA brain shows 3 small acute/subacute infarct in the high frontal lobes and right occipital lobe, patient admitted for stroke workup. Echocardiogram, carotid duplex ordered. Neurology following/ 2. Fever- likely from gout exacerbation, blood cultures have been obtained and currently pending. WBC was 7.9. Will follow CBC in a.m. Urine was clear, chest x-ray does not show pneumonia. Will observe, no antibiotics at this time. Will discontinue vancomycin. 3. Gout exacerbation- patient has flareup of gout in right foot, we'll start colchicine 0.6 mg by mouth twice a day. 4. Seizure- normal seizures in the hospital, continue Vimpat, EEG has been done and results are pending. Neurology following.    DVT prophylaxis: Lovenox Code Status: Full code Family Communication: Discussed with patient's wife at bedside  Disposition  Plan: Pending workup for stroke   Consultants:  Neurology   Procedures:  None  Antibiotics:  EEG  Subjective: Patient seen and examined, no seizures since he came to the hospital. Complains of right foot pain and warmth. Has a history of gout.  Objective: Filed Vitals:   12/21/15 0334 12/21/15 0703 12/21/15 1105 12/21/15 1130  BP: 119/80 138/91 152/88   Pulse: 56 61 65   Temp: 97.9 F (36.6 C) 97.7 F (36.5 C)  97.8 F (36.6 C)  TempSrc: Axillary Oral  Oral  Resp: 20 19 22    Height:      Weight:      SpO2: 97% 94% 95%     Intake/Output Summary (Last 24 hours) at 12/21/15 1513 Last data filed at 12/21/15 1400  Gross per 24 hour  Intake   2170 ml  Output   1225 ml  Net    945 ml   Filed Weights   12/20/15 2100  Weight: 94 kg (207 lb 3.7 oz)    Examination:  General exam: Appears calm and comfortable  Respiratory system: Clear to auscultation. Respiratory effort normal. Cardiovascular system: S1 & S2 heard, RRR. No JVD, murmurs, rubs, gallops or clicks.  Gastrointestinal system: Abdomen is nondistended, soft and nontender. No organomegaly or masses felt. Normal bowel sounds heard. Central nervous system: Alert and oriented. No focal neurological deficits. Extremities: Symmetric 5 x 5 power. Right ankle warm, tender to touch. No erythema.  Skin: No rashes, lesions or ulcers Psychiatry: Judgement and insight appear normal. Mood & affect appropriate.    Data Reviewed: I have personally reviewed following labs and imaging studies Basic Metabolic Panel:  Recent Labs Lab 12/20/15 0407 12/20/15 1600  NA 138  --   K 3.5  --   CL 108  --   CO2 19*  --   GLUCOSE 121*  --   BUN 24*  --   CREATININE 1.80*  --   CALCIUM 9.4  --   MG  --  2.1  PHOS  --  3.9   Liver Function Tests:  Recent Labs Lab 12/20/15 0407  AST 29  ALT 30  ALKPHOS 47  BILITOT 0.4  PROT 7.8  ALBUMIN 4.0   No results for input(s): LIPASE, AMYLASE in the last 168 hours. No  results for input(s): AMMONIA in the last 168 hours. CBC:  Recent Labs Lab 12/20/15 0407  WBC 7.9  NEUTROABS 6.0  HGB 14.3  HCT 44.2  MCV 74.1*  PLT 180   Cardiac Enzymes:  Recent Labs Lab 12/20/15 0407  TROPONINI <0.03   BNP (last 3 results) No results for input(s): BNP in the last 8760 hours.  ProBNP (last 3 results) No results for input(s): PROBNP in the last 8760 hours.  CBG: No results for input(s): GLUCAP in the last 168 hours.  Recent Results (from the past 240 hour(s))  MRSA PCR Screening     Status: Abnormal   Collection Time: 12/21/15  5:35 AM  Result Value Ref Range Status   MRSA by PCR POSITIVE (A) NEGATIVE Final    Comment:        The GeneXpert MRSA Assay (FDA approved for NASAL specimens only), is one component of a comprehensive MRSA colonization surveillance program. It is not intended to diagnose MRSA infection nor to guide or monitor treatment for MRSA infections. RESULT CALLED TO, READ BACK BY AND VERIFIED WITH: S.DOTY RN AT 0809 12/21/15 BY A.DAVIS      Studies: Ct Head Wo Contrast  12/20/2015  CLINICAL DATA:  Possible seizure. Shallow abrasions to the top of the head. Baseline dementia. EXAM: CT HEAD WITHOUT CONTRAST TECHNIQUE: Contiguous axial images were obtained from the base of the skull through the vertex without intravenous contrast. COMPARISON:  10/17/2015 FINDINGS: Diffuse cerebral atrophy. Low-attenuation changes in the deep white matter consistent with small vessel ischemia. Ventricular dilatation consistent with central atrophy. No mass effect or midline shift. No abnormal extra-axial fluid collections. Gray-white matter junctions are distinct. Basal cisterns are not effaced. No evidence of acute intracranial hemorrhage. No depressed skull fractures. Visualized paranasal sinuses and mastoid air cells are not opacified. Focal sclerosis again demonstrated in the right temporal bone without expansion or destruction. IMPRESSION: No acute  intracranial abnormalities. Chronic atrophy and small vessel ischemic changes. Electronically Signed   By: Lucienne Capers M.D.   On: 12/20/2015 04:32   Mr Jodene Nam Head Wo Contrast  12/21/2015  CLINICAL DATA:  Acute punctate nonhemorrhagic infarcts involving the high frontal lobes bilaterally and the right occipital lobe. Abnormal MRI. EXAM: MRA HEAD WITHOUT CONTRAST TECHNIQUE: Angiographic images of the Circle of Willis were obtained using MRA technique without intravenous contrast. COMPARISON:  MRI brain 12/20/2015. CT head without contrast 12/20/2015. FINDINGS: The study is severely degraded by patient motion. This limits evaluation of small vessels. There is significant signal loss due to patient motion through the cavernous internal carotid arteries bilaterally. These areas cannot be evaluated. The terminal ICA is intact bilaterally. The A1 and M1 segments appear to be intact. There is significant attenuation of MCA and ACA branch vessels bilaterally, likely exaggerated by patient motion. The right vertebral artery is the dominant vessel. The  vertebrobasilar junction is intact. The basilar artery is obscured by patient motion. Both posterior cerebral arteries originate from the basilar tip. Significant signal loss in the left posterior cerebral artery may be exaggerated by motion. There is some attenuation of distal small vessels. IMPRESSION: 1. The study is severely degraded by patient motion through the cavernous internal carotid arteries and basilar artery. No comments can be made on these areas. 2. The A1 and M1 segments and proximal posterior cerebral arteries are intact. 3. Moderate attenuation of MCA and distal PCA branch vessels may be exaggerated by patient motion. Electronically Signed   By: San Morelle M.D.   On: 12/21/2015 10:49   Mr Jeri Cos [id number] Contrast  12/20/2015  CLINICAL DATA:  Seizures. EXAM: MRI HEAD WITHOUT AND WITH CONTRAST TECHNIQUE: Multiplanar, multiecho pulse sequences of the  brain and surrounding structures were obtained without and with intravenous contrast. CONTRAST:  41mL MULTIHANCE GADOBENATE DIMEGLUMINE 529 MG/ML IV SOLN COMPARISON:  Head CT 12/20/2015 and MRI 05/08/2014 FINDINGS: Multiple sequences are mildly to moderately motion degraded despite the patient receiving anxiolysis, repeat imaging, and utilization of more motion resistant imaging protocols. There are 3 subcentimeter foci of cortical/subcortical diffusion abnormality which are located in the high frontal lobes and right occipital lobe. There is clearly reduced ADC in the right frontal and right occipital lobes. Reduced ADC is not confirmed in the left frontal lobe. No intracranial hemorrhage, mass, midline shift, or extra-axial fluid collection is identified. There are multiple small chronic cortical infarcts in the high frontal lobes which are new/ increased from the prior MRI and could reflect sequelae of watershed ischemia given their orientation versus emboli. Patchy T2 hyperintensities in the frontoparietal white matter have also increased from the prior MRI. A chronic, small cortical infarct in the left occipital lobe is unchanged. Numerous chronic infarcts are again seen throughout both cerebellar hemispheres as well as in the left thalamus. No abnormal enhancement is identified within limitations of motion artifact. There is moderate cerebral atrophy. Orbits are grossly unremarkable. Paranasal sinuses and mastoid air cells are clear. Major intracranial vascular flow voids are preserved. IMPRESSION: 1. Three small acute/early subacute infarcts in the high frontal lobes and right occipital lobe, emboli versus watershed ischemia. 2. Progressive, mild-to-moderate chronic small vessel ischemic disease including new small chronic cortical infarcts in the high frontal lobes. 3. Remote left occipital and bilateral cerebellar infarcts. Electronically Signed   By: Logan Bores M.D.   On: 12/20/2015 17:36   Dg Chest  Port 1 View  12/20/2015  CLINICAL DATA:  Possible seizure. EXAM: PORTABLE CHEST 1 VIEW COMPARISON:  08/18/2012 FINDINGS: Shallow inspiration with mild linear atelectasis in the lung bases. The heart size and mediastinal contours are within normal limits. Both lungs are clear. The visualized skeletal structures are unremarkable. IMPRESSION: Linear atelectasis in the lung bases. Electronically Signed   By: Lucienne Capers M.D.   On: 12/20/2015 04:41    Scheduled Meds: . [START ON 12/22/2015] clopidogrel  75 mg Oral Daily  . donepezil  5 mg Oral Daily  . fluticasone  2 spray Each Nare Daily  . lacosamide  100 mg Oral BID  . memantine  28 mg Oral Daily  . QUEtiapine  25 mg Oral QHS  . vancomycin  750 mg Intravenous Q12H   Continuous Infusions: . sodium chloride 100 mL (12/20/15 2100)       Time spent: 20 min    [facility] Pager [phone]. If 7PM-7AM, please contact night-coverage at [email or web address],  Office  [phone]  password [email or web address] 12/21/2015, 3:13 PM  LOS: 1 day

## 2015-12-22 ENCOUNTER — Encounter (HOSPITAL_COMMUNITY): Payer: Medicare Other

## 2015-12-22 LAB — BASIC METABOLIC PANEL
ANION GAP: 12 (ref 5–15)
BUN: 17 mg/dL (ref 6–20)
CALCIUM: 8.3 mg/dL — AB (ref 8.9–10.3)
CHLORIDE: 105 mmol/L (ref 101–111)
CO2: 21 mmol/L — AB (ref 22–32)
CREATININE: 1.66 mg/dL — AB (ref 0.61–1.24)
GFR calc Af Amer: 46 mL/min — ABNORMAL LOW (ref >60)
GFR calc non Af Amer: 39 mL/min — ABNORMAL LOW (ref >60)
GLUCOSE: 114 mg/dL — AB (ref 65–99)
Potassium: 2.8 mmol/L — ABNORMAL LOW (ref 3.5–5.1)
Sodium: 138 mmol/L (ref 135–145)

## 2015-12-22 LAB — CBC
HEMATOCRIT: 38.7 % — AB (ref 39.0–52.0)
HEMOGLOBIN: 12.6 g/dL — AB (ref 13.0–17.0)
MCH: 23.9 pg — ABNORMAL LOW (ref 26.0–34.0)
MCHC: 32.6 g/dL (ref 30.0–36.0)
MCV: 73.4 fL — AB (ref 78.0–100.0)
Platelets: 176 10*3/uL (ref 150–400)
RBC: 5.27 MIL/uL (ref 4.22–5.81)
RDW: 17.5 % — AB (ref 11.5–15.5)
WBC: 12.3 10*3/uL — ABNORMAL HIGH (ref 4.0–10.5)

## 2015-12-22 LAB — GLUCOSE, CAPILLARY
GLUCOSE-CAPILLARY: 109 mg/dL — AB (ref 65–99)
Glucose-Capillary: 112 mg/dL — ABNORMAL HIGH (ref 65–99)

## 2015-12-22 MED ORDER — COLCHICINE 0.6 MG PO TABS
0.6000 mg | ORAL_TABLET | Freq: Two times a day (BID) | ORAL | Status: DC
  Administered 2015-12-22 – 2015-12-24 (×5): 0.6 mg via ORAL
  Filled 2015-12-22 (×5): qty 1

## 2015-12-22 MED ORDER — POTASSIUM CHLORIDE 10 MEQ/100ML IV SOLN
10.0000 meq | INTRAVENOUS | Status: AC
  Administered 2015-12-22 (×3): 10 meq via INTRAVENOUS
  Filled 2015-12-22 (×3): qty 100

## 2015-12-22 MED ORDER — SODIUM CHLORIDE 0.9 % IV SOLN: 100.0000 mL | INTRAVENOUS | Status: DC

## 2015-12-22 NOTE — Progress Notes (Addendum)
[facility]  PROGRESS NOTE  Philip Moreno [id number] DOB: [birth year] DOA: 12/20/2015 PCP: [name], MD    Brief HPI:  75 y.o. male with medical history significant of HTN, dementia, prior TIAs and cerebellar strokes. Patient presented to the ED at [facility] this morning after an episode of seizure activity. Patient has had 3 episodes of seizure activity in past but wasn't started on seizure meds. 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 Patient was admitted to [facility] where he had prolonged post-ictal state and so was transferred [facility]. MRI done at [facility] revealed that patient appears to have 3 small areas of acute / subacute ischemic stroke. Occipital and frontal lobe, they suggest possible embolic event. Patient also spiked a fever of 101.5 this afternoon. Blood cultures were drawn and patient given 2gm rocephin prior to transfer.  Principal Problem:   Embolic stroke ([place]) Active Problems:   BP (high blood pressure)   Seizure (HCC)   CKD (chronic kidney disease) stage 3, GFR 30-59 ml/min   Fever, unspecified   CVA (cerebral infarction)   Assessment/Plan: 1. Embolic stroke- MRA brain shows 3 small acute/subacute infarct in the high frontal lobes and right occipital lobe, patient admitted for stroke workup. Echocardiogram, carotid duplex ordered. Neurology following 2. Fever-  Resolved, likely from gout exacerbation, blood cultures have been obtained and currently pending. WBC today is 12.3. Will follow CBC in a.m. Urine was clear, chest x-ray does not show pneumonia. Will observe, no antibiotics at this time.vancomycin was discontinued. 3. Gout exacerbation- patient has flareup of gout in right foot, we'll start colchicine 0.6 mg by mouth twice a day. 4. Seizure- no more  seizures in the hospital, continue Vimpat, EEG has been done and results are pending. Neurology following. 5. Hypokalemia- will replace potassium, and check bmp in am.    DVT prophylaxis: Lovenox Code Status: Full  code Family Communication: Discussed with patient's wife at bedside  Disposition Plan: Pending workup for stroke, CIR   Consultants:  Neurology   Procedures:  None  Antibiotics:  EEG  Subjective: Patient seen and examined, no seizures since he came to the hospital.he says the right foot pain is better.  Objective: Filed Vitals:   12/21/15 2356 12/22/15 0429 12/22/15 0710 12/22/15 0800  BP: 159/90 156/98 160/101   Pulse: 92 80 79   Temp: 100.7 F (38.2 C) 98.1 F (36.7 C) 99 F (37.2 C) 98 F (36.7 C)  TempSrc: Oral Oral Oral Oral  Resp: 18 22 29    Height:      Weight:      SpO2: 95% 96% 96%     Intake/Output Summary (Last 24 hours) at 12/22/15 1231 Last data filed at 12/22/15 1100  Gross per 24 hour  Intake   3620 ml  Output   1725 ml  Net   1895 ml   Filed Weights   12/20/15 2100  Weight: 94 kg (207 lb 3.7 oz)    Examination:  General exam: Appears calm and comfortable  Respiratory system: Clear to auscultation. Respiratory effort normal. Cardiovascular system: S1 & S2 heard, RRR. No JVD, murmurs, rubs, gallops or clicks.  Gastrointestinal system: Abdomen is nondistended, soft and nontender. No organomegaly or masses felt. Normal bowel sounds heard. Central nervous system: Alert and oriented. No focal neurological deficits. Extremities: Symmetric 5 x 5 power. Right ankle warm, tender to touch. No erythema.  Skin: No rashes, lesions or ulcers Psychiatry: Judgement and insight appear normal. Mood & affect appropriate.    Data Reviewed: I  have personally reviewed following labs and imaging studies Basic Metabolic Panel:  Recent Labs Lab 12/20/15 0407 12/20/15 1600 12/22/15 0252  NA 138  --  138  K 3.5  --  2.8*  CL 108  --  105  CO2 19*  --  21*  GLUCOSE 121*  --  114*  BUN 24*  --  17  CREATININE 1.80*  --  1.66*  CALCIUM 9.4  --  8.3*  MG  --  2.1  --   PHOS  --  3.9  --    Liver Function Tests:  Recent Labs Lab 12/20/15 0407  AST  29  ALT 30  ALKPHOS 47  BILITOT 0.4  PROT 7.8  ALBUMIN 4.0   No results for input(s): LIPASE, AMYLASE in the last 168 hours. No results for input(s): AMMONIA in the last 168 hours. CBC:  Recent Labs Lab 12/20/15 0407 12/22/15 0252  WBC 7.9 12.3*  NEUTROABS 6.0  --   HGB 14.3 12.6*  HCT 44.2 38.7*  MCV 74.1* 73.4*  PLT 180 176   Cardiac Enzymes:  Recent Labs Lab 12/20/15 0407  TROPONINI <0.03   BNP (last 3 results) No results for input(s): BNP in the last 8760 hours.  ProBNP (last 3 results) No results for input(s): PROBNP in the last 8760 hours.  CBG: No results for input(s): GLUCAP in the last 168 hours.  Recent Results (from the past 240 hour(s))  CULTURE, BLOOD (ROUTINE X 2) w Reflex to ID Panel     Status: None (Preliminary result)   Collection Time: 12/20/15  6:27 PM  Result Value Ref Range Status   Specimen Description BLOOD RIGHT WRIST  Final   Special Requests BOTTLES DRAWN AEROBIC AND ANAEROBIC 5 CC  Final   Culture NO GROWTH < 24 HOURS  Final   Report Status PENDING  Incomplete  CULTURE, BLOOD (ROUTINE X 2) w Reflex to ID Panel     Status: None (Preliminary result)   Collection Time: 12/20/15  6:27 PM  Result Value Ref Range Status   Specimen Description BLOOD LEFT WRIST  Final   Special Requests BOTTLES DRAWN AEROBIC AND ANAEROBIC 1 CC  Final   Culture NO GROWTH < 24 HOURS  Final   Report Status PENDING  Incomplete  MRSA PCR Screening     Status: Abnormal   Collection Time: 12/21/15  5:35 AM  Result Value Ref Range Status   MRSA by PCR POSITIVE (A) NEGATIVE Final    Comment:        The GeneXpert MRSA Assay (FDA approved for NASAL specimens only), is one component of a comprehensive MRSA colonization surveillance program. It is not intended to diagnose MRSA infection nor to guide or monitor treatment for MRSA infections. RESULT CALLED TO, READ BACK BY AND VERIFIED WITH: S.DOTY RN AT 0809 12/21/15 BY A.DAVIS      Studies: Mr Virgel Paling Wo  Contrast  12/21/2015  CLINICAL DATA:  Acute punctate nonhemorrhagic infarcts involving the high frontal lobes bilaterally and the right occipital lobe. Abnormal MRI. EXAM: MRA HEAD WITHOUT CONTRAST TECHNIQUE: Angiographic images of the Circle of Willis were obtained using MRA technique without intravenous contrast. COMPARISON:  MRI brain 12/20/2015. CT head without contrast 12/20/2015. FINDINGS: The study is severely degraded by patient motion. This limits evaluation of small vessels. There is significant signal loss due to patient motion through the cavernous internal carotid arteries bilaterally. These areas cannot be evaluated. The terminal ICA is intact bilaterally. The A1 and M1  segments appear to be intact. There is significant attenuation of MCA and ACA branch vessels bilaterally, likely exaggerated by patient motion. The right vertebral artery is the dominant vessel. The vertebrobasilar junction is intact. The basilar artery is obscured by patient motion. Both posterior cerebral arteries originate from the basilar tip. Significant signal loss in the left posterior cerebral artery may be exaggerated by motion. There is some attenuation of distal small vessels. IMPRESSION: 1. The study is severely degraded by patient motion through the cavernous internal carotid arteries and basilar artery. No comments can be made on these areas. 2. The A1 and M1 segments and proximal posterior cerebral arteries are intact. 3. Moderate attenuation of MCA and distal PCA branch vessels may be exaggerated by patient motion. Electronically Signed   By: San Morelle M.D.   On: 12/21/2015 10:49   Mr Jeri Cos [id number] Contrast  12/20/2015  CLINICAL DATA:  Seizures. EXAM: MRI HEAD WITHOUT AND WITH CONTRAST TECHNIQUE: Multiplanar, multiecho pulse sequences of the brain and surrounding structures were obtained without and with intravenous contrast. CONTRAST:  54mL MULTIHANCE GADOBENATE DIMEGLUMINE 529 MG/ML IV SOLN COMPARISON:   Head CT 12/20/2015 and MRI 05/08/2014 FINDINGS: Multiple sequences are mildly to moderately motion degraded despite the patient receiving anxiolysis, repeat imaging, and utilization of more motion resistant imaging protocols. There are 3 subcentimeter foci of cortical/subcortical diffusion abnormality which are located in the high frontal lobes and right occipital lobe. There is clearly reduced ADC in the right frontal and right occipital lobes. Reduced ADC is not confirmed in the left frontal lobe. No intracranial hemorrhage, mass, midline shift, or extra-axial fluid collection is identified. There are multiple small chronic cortical infarcts in the high frontal lobes which are new/ increased from the prior MRI and could reflect sequelae of watershed ischemia given their orientation versus emboli. Patchy T2 hyperintensities in the frontoparietal white matter have also increased from the prior MRI. A chronic, small cortical infarct in the left occipital lobe is unchanged. Numerous chronic infarcts are again seen throughout both cerebellar hemispheres as well as in the left thalamus. No abnormal enhancement is identified within limitations of motion artifact. There is moderate cerebral atrophy. Orbits are grossly unremarkable. Paranasal sinuses and mastoid air cells are clear. Major intracranial vascular flow voids are preserved. IMPRESSION: 1. Three small acute/early subacute infarcts in the high frontal lobes and right occipital lobe, emboli versus watershed ischemia. 2. Progressive, mild-to-moderate chronic small vessel ischemic disease including new small chronic cortical infarcts in the high frontal lobes. 3. Remote left occipital and bilateral cerebellar infarcts. Electronically Signed   By: Logan Bores M.D.   On: 12/20/2015 17:36    Scheduled Meds: . clopidogrel  75 mg Oral Daily  . colchicine  0.6 mg Oral BID  . donepezil  5 mg Oral Daily  . enoxaparin (LOVENOX) injection  40 mg Subcutaneous Q24H  .  fluticasone  2 spray Each Nare Daily  . lacosamide  100 mg Oral BID  . memantine  28 mg Oral Daily  . QUEtiapine  25 mg Oral QHS   Continuous Infusions: . sodium chloride 100 mL (12/22/15 1100)       Time spent: 20 min    [facility] Pager [phone]. If 7PM-7AM, please contact night-coverage at [email or web address], Office  [phone]  password [email or web address] 12/22/2015, 12:31 PM  LOS: 2 days

## 2015-12-22 NOTE — Progress Notes (Signed)
Rehab Admissions Coordinator Note:  Patient was screened by Retta Diones for appropriateness for an Inpatient Acute Rehab Consult.  At this time, we are recommending Inpatient Rehab consult.  Retta Diones 12/22/2015, 8:45 PM  I can be reached at [phone].

## 2015-12-22 NOTE — Progress Notes (Signed)
STROKE TEAM PROGRESS NOTE   SUBJECTIVE (INTERVAL HISTORY) His wife is at the bedside.  Overall his condition is gradually improving.  Wife stated that he had two episode of intangible words last night, likely due to dementia with sundowning and low grade fever. This am he was doing well at his baseline. Last night two episode of fever, with 38 and 38.2C. This am afebrile. He seems to have right foot gout flare which can explain his fever. Blood culture NGTD.    OBJECTIVE Temp:  [98 F (36.7 C)-100.7 F (38.2 C)] 98.5 F (36.9 C) (05/21 1300) Pulse Rate:  [79-92] 86 (05/21 1300) Cardiac Rhythm:  [-] Normal sinus rhythm (05/21 1300) Resp:  [18-29] 21 (05/21 1300) BP: (145-160)/(79-101) 145/79 mmHg (05/21 1300) SpO2:  [94 %-99 %] 99 % (05/21 1300)  CBC:   Recent Labs Lab 12/20/15 0407 12/22/15 0252  WBC 7.9 12.3*  NEUTROABS 6.0  --   HGB 14.3 12.6*  HCT 44.2 38.7*  MCV 74.1* 73.4*  PLT 180 0000000    Basic Metabolic Panel:   Recent Labs Lab 12/20/15 0407 12/20/15 1600 12/22/15 0252  NA 138  --  138  K 3.5  --  2.8*  CL 108  --  105  CO2 19*  --  21*  GLUCOSE 121*  --  114*  BUN 24*  --  17  CREATININE 1.80*  --  1.66*  CALCIUM 9.4  --  8.3*  MG  --  2.1  --   PHOS  --  3.9  --     Lipid Panel:     Component Value Date/Time   CHOL 94 12/21/2015 0354   TRIG 50 12/21/2015 0354   HDL 36* 12/21/2015 0354   CHOLHDL 2.6 12/21/2015 0354   VLDL 10 12/21/2015 0354   LDLCALC 48 12/21/2015 0354   HgbA1c:  Lab Results  Component Value Date   HGBA1C 6.2* 12/20/2015   Urine Drug Screen:     Component Value Date/Time   LABOPIA NONE DETECTED 12/20/2015 0439   COCAINSCRNUR NONE DETECTED 12/20/2015 0439   LABBENZ NONE DETECTED 12/20/2015 0439   AMPHETMU NONE DETECTED 12/20/2015 0439   THCU NONE DETECTED 12/20/2015 0439   LABBARB NONE DETECTED 12/20/2015 0439      IMAGING I have personally reviewed the radiological images below and agree with the radiology  interpretations.  Ct Head Wo Contrast 12/20/2015   No acute intracranial abnormalities. Chronic atrophy and small vessel ischemic changes.   Mr Kizzie Fantasia Contrast 12/20/2015   1. Three small acute/early subacute infarcts in the high frontal lobes and right occipital lobe, emboli versus watershed ischemia.  2. Progressive, mild-to-moderate chronic small vessel ischemic disease including new small chronic cortical infarcts in the high frontal lobes.  3. Remote left occipital and bilateral cerebellar infarcts.   Dg Chest Port 1 View 12/20/2015   Linear atelectasis in the lung bases.   EEG - pending  Mra Head Wo Contrast 12/21/2015  IMPRESSION: 1. The study is severely degraded by patient motion through the cavernous internal carotid arteries and basilar artery. No comments can be made on these areas. 2. The A1 and M1 segments and proximal posterior cerebral arteries are intact. 3. Moderate attenuation of MCA and distal PCA branch vessels may be exaggerated by patient motion.   CUS - pending  TTE - - Procedure narrative: Transthoracic echocardiography. Image  quality was adequate. The study was technically difficult. - Left ventricle: The cavity size was normal. There was mild  concentric hypertrophy. Systolic function was normal. The  estimated ejection fraction was in the range of 55% to 60%.  Doppler parameters are consistent with abnormal left ventricular  relaxation (grade 1 diastolic dysfunction). - Aortic valve: There was trivial regurgitation. Impressions: - No cardiac source of emboli was indentified.   PHYSICAL EXAM  Temp:  [98 F (36.7 C)-100.7 F (38.2 C)] 98.5 F (36.9 C) (05/21 1300) Pulse Rate:  [79-92] 86 (05/21 1300) Resp:  [18-29] 21 (05/21 1300) BP: (145-160)/(79-101) 145/79 mmHg (05/21 1300) SpO2:  [94 %-99 %] 99 % (05/21 1300)  General - Well nourished, well developed, in no apparent distress, sitting in chair.  Ophthalmologic - Fundi not visualized  due to noncooperation.  Cardiovascular - Regular rate and rhythm.  Mental Status -  Level of arousal and orientation to place, and person were intact, but not orientated to time. Language including expression, naming, repetition, comprehension was assessed and found intact, mild dysarthria. Fund of Knowledge was assessed and was impaired.  Cranial Nerves II - XII - II - Visual field intact OU. III, IV, VI - Extraocular movements intact. V - Facial sensation intact bilaterally. VII - Facial movement intact bilaterally. VIII - Hearing & vestibular intact bilaterally. X - Palate elevates symmetrically, mild dysarthria. XI - Chin turning & shoulder shrug intact bilaterally. XII - Tongue protrusion intact.  Motor Strength - The patient's strength was normal in all extremities except right DF difficulty due to pain in the ankle due to gout and pronator drift was absent.  Bulk was normal and fasciculations were absent.   Motor Tone - Muscle tone was assessed at the neck and appendages and was normal.  Reflexes - The patient's reflexes were 1+ in all extremities and he had no pathological reflexes.  Sensory - Light touch, temperature/pinprick were assessed and were symmetrical.    Coordination - The patient had normal movements in the hands with no ataxia or dysmetria.  Tremor was absent.  Gait and Station - deferred due to safety concerns.   ASSESSMENT/PLAN Philip Moreno is a 75 y.o. male with history of hypertension, dementia, prostate cancer, previous TIA presenting with new onset seizures.  He did not receive IV t-PA due to unknown time of onset.  Seizure  Had 3 episodes in the past, no AED initiated  Loaded with vimpat and now on maintenance vimpat 100mg  bid  EEG pending  Continue vimpat  MRI with and without contrast did not show tumor or structural lesion to explain seizure  May be related to dementia  Follow up with Dr. Jaynee Eagles as outpt  Stroke:  Bilateral MCA/ACA,  and right MCA/PCA watershed infarcts, embolic vs hypoperfusion/hypoxia. This can be related to hypoxia during seizure or cardioembolic. We recommend BP control and 30 day cardiac event monitoring.  Resultant  Back to baseline  MRI  Three small acute/early subacute infarcts in the high frontal lobes and right occipital lobe, watershed distribution. Old cerebellar punctate infarcts bilaterally with questionable old left MCA/PCA infarct.   MRA - motion degraded with no proximal LVO  Carotid Doppler - pending  Blood cultures - NGTD, will consider TEE if blood Cx positive  2D Echo unremarkable EF 55-60%  Recommend 30 day cardiac event monitoring as outpt to rule out afib  LDL - 48  HgbA1c pending  VTE prophylaxis - SCDs Diet regular Room service appropriate?: Yes; Fluid consistency:: Thin  aspirin 81 mg daily prior to admission, now on aspirin 81 mg daily. Recommend to change to plavix  for stroke prevention.   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 Jaynee Eagles after discharge.   Hospital day # 2  Philip Hawking, MD PhD Stroke Neurology 12/22/2015 4:02 PM      To contact Stroke Continuity provider, please refer to [email or web address] After hours, contact General Neurology

## 2015-12-22 NOTE — Progress Notes (Signed)
Physical Therapy Treatment Patient Details Name: Philip Moreno MRN: [id number] DOB: 08-Dec-1940 Today's Date: 12/22/2015    History of Present Illness 75 y.o. male admitted to [facility]/15 for AMS, seizure activity.  MRI revealed 3 small areas of acute/subacute ischemic stroke (occipital and frontal lobes) suggestive of emolic event.  Pt also spiked a fever of 101.5 medical workup pending and EEG results pending.  Pt with significant PMHx of HTN, memory loss, TIA, and per family gout.      PT Comments    Patient requiring +2 assist for transfers today.  Agree with need for Inpatient Rehab stay prior to discharge.  Follow Up Recommendations  CIR     Equipment Recommendations  Rolling walker with 5" wheels;Wheelchair (measurements PT);Wheelchair cushion (measurements PT);3in1 (PT)    Recommendations for Other Services Rehab consult     Precautions / Restrictions Precautions Precautions: Fall Precaution Comments: Significant posterior lean in sitting and standing. Restrictions Weight Bearing Restrictions: No (Gout flare in Rt foot)    Mobility  Bed Mobility Overal bed mobility: Needs Assistance Bed Mobility: Supine to Sit     Supine to sit: Min assist     General bed mobility comments: Assist to bring trunk forward to maintain balance.  Patient with posterior lean in sitting.  Transfers Overall transfer level: Needs assistance Equipment used: Rolling walker (2 wheeled);None Transfers: Sit to/from [facility] Sit to Stand: Mod assist;Max assist;+2 physical assistance Stand pivot transfers: Max assist;+2 physical assistance       General transfer comment: Verbal and tactile cues to keep weight forward.  Assist to power up to stance.  Patient with significant posterior lean. Unable to reach fully upright posture.  On second attempt, removed RW.  Assisted patient at hips to bring hips and trunk forward over feet.  Required +2 max assist to take shuffle steps  to pivot to chair.  Ambulation/Gait             General Gait Details: Unable   Stairs            Wheelchair Mobility    Modified Rankin (Stroke Patients Only) Modified Rankin (Stroke Patients Only) Pre-Morbid Rankin Score: No symptoms Modified Rankin: Severe disability     Balance Overall balance assessment: Needs assistance Sitting-balance support: Single extremity supported;Feet supported Sitting balance-Leahy Scale: Poor Sitting balance - Comments: Required repeated cues to shift weight forward to maintain balance. Postural control: Posterior lean Standing balance support: Bilateral upper extremity supported Standing balance-Leahy Scale: Poor                      Cognition Arousal/Alertness: Awake/alert Behavior During Therapy: WFL for tasks assessed/performed Overall Cognitive Status: History of cognitive impairments - at baseline                      Exercises      General Comments        Pertinent Vitals/Pain Pain Assessment: 0-10 Pain Score: 6  Pain Location: Rt foot Pain Descriptors / Indicators: Grimacing;Moaning;Sharp;Sore Pain Intervention(s): Limited activity within patient's tolerance;Monitored during session;Repositioned    Home Living                      Prior Function            PT Goals (current goals can now be found in the care plan section) Progress towards PT goals: Progressing toward goals    Frequency  Min 3X/week  PT Plan Current plan remains appropriate    Co-evaluation             End of Session Equipment Utilized During Treatment: Gait belt Activity Tolerance: Patient limited by fatigue;Patient limited by pain Patient left: in chair;with call bell/phone within reach;with family/visitor present     Time: 1420-1446 PT Time Calculation (min) (ACUTE ONLY): 26 min  Charges:  $Therapeutic Activity: 23-37 mins                    G Codes:      Despina Pole 2016/01/10, 6:59  PM Carita Pian. Sanjuana Kava, [place] Pager [phone]

## 2015-12-23 ENCOUNTER — Inpatient Hospital Stay (HOSPITAL_COMMUNITY): Payer: Medicare Other

## 2015-12-23 DIAGNOSIS — I63413 Cerebral infarction due to embolism of bilateral middle cerebral arteries: Secondary | ICD-10-CM

## 2015-12-23 DIAGNOSIS — M10072 Idiopathic gout, left ankle and foot: Secondary | ICD-10-CM

## 2015-12-23 DIAGNOSIS — R0682 Tachypnea, not elsewhere classified: Secondary | ICD-10-CM

## 2015-12-23 DIAGNOSIS — D72829 Elevated white blood cell count, unspecified: Secondary | ICD-10-CM | POA: Insufficient documentation

## 2015-12-23 DIAGNOSIS — I10 Essential (primary) hypertension: Secondary | ICD-10-CM

## 2015-12-23 DIAGNOSIS — M1A9XX0 Chronic gout, unspecified, without tophus (tophi): Secondary | ICD-10-CM

## 2015-12-23 DIAGNOSIS — R509 Fever, unspecified: Secondary | ICD-10-CM

## 2015-12-23 DIAGNOSIS — R569 Unspecified convulsions: Secondary | ICD-10-CM

## 2015-12-23 DIAGNOSIS — E876 Hypokalemia: Secondary | ICD-10-CM

## 2015-12-23 DIAGNOSIS — F0151 Vascular dementia with behavioral disturbance: Secondary | ICD-10-CM | POA: Insufficient documentation

## 2015-12-23 DIAGNOSIS — D62 Acute posthemorrhagic anemia: Secondary | ICD-10-CM

## 2015-12-23 DIAGNOSIS — F0390 Unspecified dementia without behavioral disturbance: Secondary | ICD-10-CM

## 2015-12-23 DIAGNOSIS — R6510 Systemic inflammatory response syndrome (SIRS) of non-infectious origin without acute organ dysfunction: Secondary | ICD-10-CM

## 2015-12-23 DIAGNOSIS — N183 Chronic kidney disease, stage 3 (moderate): Secondary | ICD-10-CM

## 2015-12-23 DIAGNOSIS — Z8673 Personal history of transient ischemic attack (TIA), and cerebral infarction without residual deficits: Secondary | ICD-10-CM

## 2015-12-23 DIAGNOSIS — N179 Acute kidney failure, unspecified: Secondary | ICD-10-CM

## 2015-12-23 DIAGNOSIS — F01518 Vascular dementia, unspecified severity, with other behavioral disturbance: Secondary | ICD-10-CM | POA: Insufficient documentation

## 2015-12-23 DIAGNOSIS — R410 Disorientation, unspecified: Secondary | ICD-10-CM

## 2015-12-23 LAB — BASIC METABOLIC PANEL
Anion gap: 9 (ref 5–15)
BUN: 13 mg/dL (ref 6–20)
CHLORIDE: 106 mmol/L (ref 101–111)
CO2: 22 mmol/L (ref 22–32)
CREATININE: 1.43 mg/dL — AB (ref 0.61–1.24)
Calcium: 8.5 mg/dL — ABNORMAL LOW (ref 8.9–10.3)
GFR, EST AFRICAN AMERICAN: 55 mL/min — AB (ref >60)
GFR, EST NON AFRICAN AMERICAN: 47 mL/min — AB (ref >60)
Glucose, Bld: 106 mg/dL — ABNORMAL HIGH (ref 65–99)
POTASSIUM: 3 mmol/L — AB (ref 3.5–5.1)
SODIUM: 137 mmol/L (ref 135–145)

## 2015-12-23 LAB — HEMOGLOBIN A1C
HEMOGLOBIN A1C: 6.4 % — AB (ref 4.8–5.6)
Mean Plasma Glucose: 137 mg/dL

## 2015-12-23 MED ORDER — CHLORHEXIDINE GLUCONATE CLOTH 2 % EX PADS
6.0000 | MEDICATED_PAD | Freq: Every day | CUTANEOUS | Status: DC
  Administered 2015-12-24: 6 via TOPICAL

## 2015-12-23 MED ORDER — MUPIROCIN 2 % EX OINT
1.0000 "application " | TOPICAL_OINTMENT | Freq: Two times a day (BID) | CUTANEOUS | Status: DC
  Administered 2015-12-23 – 2015-12-24 (×2): 1 via NASAL
  Filled 2015-12-23: qty 22

## 2015-12-23 MED ORDER — POTASSIUM CHLORIDE CRYS ER 20 MEQ PO TBCR
40.0000 meq | EXTENDED_RELEASE_TABLET | ORAL | Status: AC
  Administered 2015-12-23 (×2): 40 meq via ORAL
  Filled 2015-12-23: qty 2

## 2015-12-23 MED ORDER — POTASSIUM CHLORIDE 10 MEQ/100ML IV SOLN
10.0000 meq | INTRAVENOUS | Status: DC
  Administered 2015-12-23: 10 meq via INTRAVENOUS
  Filled 2015-12-23 (×2): qty 100

## 2015-12-23 NOTE — Clinical Social Work Placement (Signed)
   CLINICAL SOCIAL WORK PLACEMENT  NOTE  Date:  12/23/2015  Patient Details  Name: Philip Moreno MRN: [id number] Date of Birth: [birth year]  Clinical Social Work is seeking post-discharge placement for this patient at the [place] level of care (*CSW will initial, date and re-position this form in  chart as items are completed):  Yes   Patient/family provided with [place] Work Department's list of facilities offering this level of care within the geographic area requested by the patient (or if unable, by the patient's family).  Yes   Patient/family informed of their freedom to choose among providers that offer the needed level of care, that participate in Medicare, Medicaid or managed care program needed by the patient, have an available bed and are willing to accept the patient.  Yes   Patient/family informed of [facility]'s ownership interest in [facility] and [facility], as well as of the fact that they are under no obligation to receive care at these facilities.  PASRR submitted to EDS on       PASRR number received on 12/23/15     Existing PASRR number confirmed on       FL2 transmitted to all facilities in geographic area requested by pt/family on 12/23/15     FL2 transmitted to all facilities within larger geographic area on       Patient informed that his/her managed care company has contracts with or will negotiate with certain facilities, including the following:            Patient/family informed of bed offers received.  Patient chooses bed at       Physician recommends and patient chooses bed at      Patient to be transferred to   on  .  Patient to be transferred to facility by       Patient family notified on   of transfer.  Name of family member notified:        PHYSICIAN       Additional Comment:    _______________________________________________ Candie Chroman, LCSW 12/23/2015, 4:07 PM

## 2015-12-23 NOTE — NC FL2 (Signed)
[place] LEVEL OF CARE SCREENING TOOL     IDENTIFICATION  Patient Name: Philip Moreno Birthdate: 07-09-1941 Sex: male Admission Date (Current Location): 12/20/2015  [facility] and [place] Number:  Engineering geologist and Address:  [facility], [place] [address], [place], [place] [address]      Provider Number: [id number]  Attending Physician Name and Address:  Oswald Hillock, MD  Relative Name and Phone Number:       Current Level of Care: Hospital Recommended Level of Care: [place] Prior Approval Number:    Date Approved/Denied:   PASRR Number: [id number]  Discharge Plan: SNF    Current Diagnoses: Patient Active Problem List   Diagnosis Date Noted  . Gout attack 12/23/2015  . History of TIA (transient ischemic attack)   . Dementia   . Tachypnea   . Pyrexia   . Disorientation   . Hypokalemia   . AKI (acute kidney injury) ([place])   . Leukocytosis   . Acute blood loss anemia   . SIRS (systemic inflammatory response syndrome) ([facility])   . CVA (cerebral infarction) 12/21/2015  . Seizure ([place]) 12/20/2015  . Embolic stroke ([place]) [id number]  . CKD (chronic kidney disease) stage 3, GFR 30-59 ml/min 12/20/2015  . Fever, unspecified 12/20/2015  . Other specified transient cerebral ischemias 05/02/2014  . Pre-syncope 04/24/2014  . 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 Arthritis 04/24/2014  . BP (high blood pressure) 04/24/2014  . Confusion state 02/28/2014  . Dizziness 02/28/2014  . Convulsions ([place]) 02/28/2014    Orientation RESPIRATION BLADDER Height & Weight     Self, Place  Normal Incontinent, External catheter Weight: 207 lb 3.7 oz (94 kg) Height:  6\' 1"  (185.4 cm)  BEHAVIORAL SYMPTOMS/MOOD NEUROLOGICAL BOWEL NUTRITION STATUS   (None) Convulsions/Seizures (Dementia, Embolic Stroke) Continent Diet (Regular)  AMBULATORY STATUS COMMUNICATION OF NEEDS Skin   Extensive Assist Verbally Other (Comment)  (Laceration right upper head)                       Personal Care Assistance Level of Assistance  Bathing, Feeding, Dressing Bathing Assistance: Limited assistance Feeding assistance: Independent Dressing Assistance: Limited assistance     Functional Limitations Info  Sight, Speech, Hearing Sight Info: Adequate Hearing Info: Adequate Speech Info: Adequate    SPECIAL CARE FACTORS FREQUENCY  PT (By licensed PT), OT (By licensed OT). Blood Pressure     PT Frequency: 5 x week OT Frequency: 5 x week            Contractures Contractures Info: Not present    Additional Factors Info  Code Status, Allergies, Isolation Precautions Code Status Info: Full Allergies Info: NKDA     Isolation Precautions Info: Contact precautions: MRSA     Current Medications (12/23/2015):  This is the current hospital active medication list Current Facility-Administered Medications  Medication Dose Route Frequency Provider Last Rate Last Dose  . 0.9 %  sodium chloride infusion  100 mL Intravenous Continuous Oswald Hillock, MD 10 mL/hr at 12/22/15 1400 100 mL at 12/22/15 1400  . acetaminophen (TYLENOL) tablet 650 mg  650 mg Oral Q6H PRN Etta Quill, DO   650 mg at 12/23/15 0500  . [START ON 12/24/2015] Chlorhexidine Gluconate Cloth 2 % PADS 6 each  6 each Topical Q0600 Oswald Hillock, MD      . clopidogrel (PLAVIX) tablet 75 mg  75 mg Oral Daily Rosalin Hawking, MD  75 mg at 12/23/15 1022  . colchicine tablet 0.6 mg  0.6 mg Oral BID Oswald Hillock, MD   0.6 mg at 12/23/15 1022  . donepezil (ARICEPT) tablet 5 mg  5 mg Oral Daily Etta Quill, DO   5 mg at 12/23/15 1242  . enoxaparin (LOVENOX) injection 40 mg  40 mg Subcutaneous Q24H Oswald Hillock, MD   40 mg at 12/23/15 1451  . fluticasone (FLONASE) 50 MCG/ACT nasal spray 2 spray  2 spray Each Nare Daily Etta Quill, DO   2 spray at 12/21/15 1111  . lacosamide (VIMPAT) tablet 100 mg  100 mg Oral BID Etta Quill, DO   100 mg at 12/23/15 1022   . memantine (NAMENDA XR) 24 hr capsule 28 mg  28 mg Oral Daily Etta Quill, DO   28 mg at 12/21/15 1110  . mupirocin ointment (BACTROBAN) 2 % 1 application  1 application Nasal BID Oswald Hillock, MD      . potassium chloride SA (K-DUR,KLOR-CON) CR tablet 40 mEq  40 mEq Oral Q4H Oswald Hillock, MD   40 mEq at 12/23/15 1451  . QUEtiapine (SEROQUEL) tablet 25 mg  25 mg Oral QHS Etta Quill, DO   25 mg at 12/22/15 2107  . senna-docusate (Senokot-S) tablet 1 tablet  1 tablet Oral QHS PRN Etta Quill, DO         Discharge Medications: Please see discharge summary for a list of discharge medications.  Relevant Imaging Results:  Relevant Lab Results:   Additional Information SS#: [id number]  Candie Chroman, LCSW

## 2015-12-23 NOTE — Consult Note (Signed)
Physical Medicine and Rehabilitation Consult  Reason for Consult:  Seizures, cognitive deficits and gait disorder Referring Physician: Dr. Darrick Meigs   HPI: Philip Moreno is a 75 y.o. male with history of HTN, prostate cancer, gout, TIA, dementia, seizures X 3 in the past who was admitted on 12/20/15 with seizure activity and lethargy. while in ED he developed another seizure lasting 1 minute and was treated with ativan and loaded with keppra.    He was also noted to have low grade fevers with concerns of endocarditis.  He continued to be lethargic with decrease in LOC and was transferred from [facility] due to concerns of post ictal state v/s ongoing seizures. Keppra changed to Vimpat for treatment and he was placed on IV vancomycin empirically.  MRI brain done revealing 2 tiny frontal lobes subcortical infarcts and one tiny right posterior temporal infarct, progressive small vessel disease and remote left occipital and bilateral cerebellar infarcts. MRA brain without stenosis but significant degraded due to patient motion.  2D echo with EF 55-60% and mild concentric hypertrophy with grad 1 diastolic dysfunction.  Work up pending and 30 day event monitor recommended to rule out A fib as cause of stroke. Dr. Erlinda Hong recommends changing ASA to plavix. Patient had recurrent fever this weekend felt to be due to gout flare and  and d/c vancomycin as doubt endocarditis. Patient with resultant sundowning with agitation, right foot pain, balance deficits with shuffling gait as well as worsening of cognition. CIR recommended for follow up therapy.     Review of Systems  HENT: Negative for hearing loss.   Eyes: Negative for blurred vision and double vision.  Respiratory: Negative for cough and shortness of breath.   Cardiovascular: Negative for chest pain and palpitations.  Gastrointestinal: Negative for heartburn and nausea.  Genitourinary: Negative for dysuria and urgency.  Musculoskeletal: Negative for joint pain.   Neurological: Positive for seizures and headaches. Negative for dizziness and sensory change.  Psychiatric/Behavioral: Positive for memory loss. The patient has insomnia (better with seroquel).   All other systems reviewed and are negative.     Past Medical History  Diagnosis Date  . Hypertension   . GERD (gastroesophageal reflux disease)   . Arthritis   . Cancer [facility]) 05/2011    prostate cancer, [facility]  . Memory loss   . TIA (transient ischemic attack)     Past Surgical History  Procedure Laterality Date  . Robot assisted laparoscopic radical prostatectomy  08/25/2012    Procedure: ROBOTIC ASSISTED LAPAROSCOPIC RADICAL PROSTATECTOMY LEVEL 2;  Surgeon: Dutch Gray, MD;  Location: [facility] ORS;  Service: Urology;  Laterality: N/A;  . Lymphadenectomy  08/25/2012    Procedure: LYMPHADENECTOMY;  Surgeon: Dutch Gray, MD;  Location: [facility] ORS;  Service: Urology;  Laterality: Bilateral;  . Prostate surgery  2012  . Colonoscopy with propofol N/A 02/27/2015    Procedure: COLONOSCOPY WITH PROPOFOL;  Surgeon: Christene Lye, MD;  Location: [facility] ENDOSCOPY;  Service: Endoscopy;  Laterality: N/A;    Family History  Problem Relation Age of Onset  . Hypertension Mother   . Heart attack Father   . Cancer Brother   . Heart failure Brother   . Diabetes Brother     Social History:  Married. Independent for ADLs and mobility PTA. 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 He reports that he has never smoked. His smokeless tobacco use includes Snuff and Chew--"as much as he can".  Per reports that he does  not drink alcohol or use illicit drugs.    Allergies: No Known Allergies    Medications Prior to Admission  Medication Sig Dispense Refill  . acetaminophen (TYLENOL) 325 MG tablet Take 2 tablets (650 mg total) by mouth every 6 (six) hours as needed for mild pain (or Fever >/= 101).    Marland Kitchen aspirin EC 81 MG EC tablet Take 1 tablet (81 mg total) by mouth daily.    . cefTRIAXone 2 g in  dextrose 5 % 50 mL Inject 2 g into the vein every 12 (twelve) hours.    Marland Kitchen donepezil (ARICEPT) 5 MG tablet Take 1 tablet (5 mg total) by mouth daily. 30 tablet 11  . fluticasone (FLONASE) 50 MCG/ACT nasal spray Place 2 sprays into both nostrils daily. 16 g 2  . lacosamide 100 MG TABS Take 1 tablet (100 mg total) by mouth 2 (two) times daily. 60 tablet   . lisinopril (PRINIVIL,ZESTRIL) 20 MG tablet Take 20 mg by mouth daily.    . memantine (NAMENDA XR) 28 MG CP24 24 hr capsule Take 1 capsule (28 mg total) by mouth daily. 30 capsule 11  . metoprolol succinate (TOPROL-XL) 100 MG 24 hr tablet Take 100 mg by mouth daily. Take with or immediately following a meal.    . QUEtiapine (SEROQUEL) 25 MG tablet Take 2 tablets (50 mg total) by mouth at bedtime. Reported on 08/13/2015 (Patient taking differently: Take 25 mg by mouth at bedtime. Reported on 08/13/2015) 60 tablet 11  . sodium chloride 0.9 % infusion Inject 100 mLs into the vein continuous.  0    Home: Home Living Family/patient expects to be discharged to:: Private residence Living Arrangements: Spouse/significant other Available Help at Discharge: Family Type of Home: House Home Access: Stairs to enter [employer] of Steps: [place]: One level  Lives With: Spouse  Functional History: Prior Function Level of Independence: Independent Comments: per wife he did not use RW or any assitive device at home.  He did all of his ADLs she just has his closet color coordinated so it is easier for him to get dressed.  Functional Status:  Mobility: Bed Mobility Overal bed mobility: Needs Assistance Bed Mobility: Supine to Sit Supine to sit: Min assist General bed mobility comments: Assist to bring trunk forward to maintain balance.  Patient with posterior lean in sitting. Transfers Overall transfer level: Needs assistance Equipment used: Rolling walker (2 wheeled), None Transfers: Sit to/from Stand, [employer] Transfers Sit to  Stand: Mod assist, Max assist, +2 physical assistance Stand pivot transfers: Max assist, +2 physical assistance General transfer comment: Verbal and tactile cues to keep weight forward.  Assist to power up to stance.  Patient with significant posterior lean. Unable to reach fully upright posture.  On second attempt, removed RW.  Assisted patient at hips to bring hips and trunk forward over feet.  Required +2 max assist to take shuffle steps to pivot to chair. Ambulation/Gait General Gait Details: Unable    ADL:    Cognition: Cognition Overall Cognitive Status: History of cognitive impairments - at baseline Orientation Level: Oriented to person, Oriented to place, Disoriented to situation, Disoriented to time Attention: Alternating Memory: Impaired Memory Impairment: Decreased short term memory Problem Solving: Appears intact Behaviors: Verbal agitation Safety/Judgment: Appears intact Cognition Arousal/Alertness: Awake/alert Behavior During Therapy: WFL for tasks assessed/performed Overall Cognitive Status: History of cognitive impairments - at baseline   Blood pressure 147/97, pulse 66, temperature 98 F (36.7 C), temperature source Oral, resp. rate  22, height 6\' 1"  (1.854 m), weight 94 kg (207 lb 3.7 oz), SpO2 93 %. Physical Exam  Nursing note and vitals reviewed. Constitutional: He appears well-developed and well-nourished. No distress.  HENT:  Head: Normocephalic and atraumatic.  Mouth/Throat: Oropharynx is clear and moist.  Eyes: Conjunctivae are normal. Pupils are equal, round, and reactive to light.  Neck: Normal range of motion. Neck supple.  Cardiovascular: Normal rate and regular rhythm.   No murmur heard. Respiratory: Effort normal and breath sounds normal. No respiratory distress.  GI: Soft. Bowel sounds are normal. He exhibits no distension. There is no tenderness.  Musculoskeletal: He exhibits edema (1+ edema right ankle with minimal tenderness. ). He exhibits no  tenderness.  Neurological: He is alert.  Mild dysarthria.   Oriented to self, place, city but not situation.  Needed cues to recall age/DOB.   He is able to follow one and two step motor commands but easily distracted.   Sensation intact light touch Motor: Left upper extremitie 4+/5 (pain inhibition) Right upper extremity, bilateral lower extremity: 4+-5/5 DTRs symmetric, hyperreflexive throughout  Skin: Skin is warm and dry. He is not diaphoretic.  Psychiatric: His affect is blunt. His speech is delayed. He is slowed. Cognition and memory are impaired. He expresses inappropriate judgment.    Results for orders placed or performed during the hospital encounter of 12/20/15 (from the past 24 hour(s))  Glucose, capillary     Status: Abnormal   Collection Time: 12/22/15 12:59 PM  Result Value Ref Range   Glucose-Capillary 112 (H) 65 - 99 mg/dL  Glucose, capillary     Status: Abnormal   Collection Time: 12/22/15  5:21 PM  Result Value Ref Range   Glucose-Capillary 109 (H) 65 - 99 mg/dL  Basic metabolic panel     Status: Abnormal   Collection Time: 12/23/15  4:05 AM  Result Value Ref Range   Sodium 137 135 - 145 mmol/L   Potassium 3.0 (L) 3.5 - 5.1 mmol/L   Chloride 106 101 - 111 mmol/L   CO2 22 22 - 32 mmol/L   Glucose, Bld 106 (H) 65 - 99 mg/dL   BUN 13 6 - 20 mg/dL   Creatinine, Ser 1.43 (H) 0.61 - 1.24 mg/dL   Calcium 8.5 (L) 8.9 - 10.3 mg/dL   GFR calc non Af Amer 47 (L) >60 mL/min   GFR calc Af Amer 55 (L) >60 mL/min   Anion gap 9 5 - 15   Mr [facility] Wo Contrast  12/21/2015  CLINICAL DATA:  Acute punctate nonhemorrhagic infarcts involving the high frontal lobes bilaterally and the right occipital lobe. Abnormal MRI. EXAM: MRA HEAD WITHOUT CONTRAST TECHNIQUE: Angiographic images of the Circle of Willis were obtained using MRA technique without intravenous contrast. COMPARISON:  MRI brain 12/20/2015. CT head without contrast 12/20/2015. FINDINGS: The study is severely degraded  by patient motion. This limits evaluation of small vessels. There is significant signal loss due to patient motion through the cavernous internal carotid arteries bilaterally. These areas cannot be evaluated. The terminal ICA is intact bilaterally. The A1 and M1 segments appear to be intact. There is significant attenuation of MCA and ACA branch vessels bilaterally, likely exaggerated by patient motion. The right vertebral artery is the dominant vessel. The vertebrobasilar junction is intact. The basilar artery is obscured by patient motion. Both posterior cerebral arteries originate from the basilar tip. Significant signal loss in the left posterior cerebral artery may be exaggerated by motion. There is some attenuation of  distal small vessels. IMPRESSION: 1. The study is severely degraded by patient motion through the cavernous internal carotid arteries and basilar artery. No comments can be made on these areas. 2. The A1 and M1 segments and proximal posterior cerebral arteries are intact. 3. Moderate attenuation of MCA and distal PCA branch vessels may be exaggerated by patient motion. Electronically Signed   By: San Morelle M.D.   On: 12/21/2015 10:49    Assessment/Plan: Diagnosis: Seizures/bilateral infarcts Labs and images independently reviewed.  Records reviewed and summated above. Stroke: Continue secondary stroke prophylaxis and Risk Factor Modification listed below:   Antiplatelet therapy:  Blood Pressure Management:  Continue current medication with prn's with permisive HTN per primary team Statin Agent:   Diabetes management:    1. Does the need for close, 24 hr/day medical supervision in concert with the patient's rehab needs make it unreasonable for this patient to be served in a less intensive setting? Yes 2. Co-Morbidities requiring supervision/potential complications:  HTN (monitor and provide prns in accordance with increased physical exertion and pain), prostate cancer,  gout (monitor for resolution of flare), TIA (cont meds), dementia, seizures (cont meds), fevers, Sundowning (reorient as needed), tachypnea (monitor RR and O2 Sats with increased physical exertion), hypokalemia (cont to monitor, replete as neccessary), AKI on CKD (continue to monitor, avoid nephrotoxic meds), leukocytosis (cont to monitor for signs and symptoms of infection, further workup if indicated), ABLA (transfuse if necessary to ensure appropriate perfusion for increased activity tolerance) 3. Due to bladder management, safety, disease management, medication administration and patient education, does the patient require 24 hr/day rehab nursing? Yes 4. Does the patient require coordinated care of a physician, rehab nurse, PT (1-2 hrs/day, 5 days/week), OT (1-2 hrs/day, 5 days/week) and SLP (1-2 hrs/day, 5 days/week) to address physical and functional deficits in the context of the above medical diagnosis(es)? Yes Addressing deficits in the following areas: balance, endurance, locomotion, strength, transferring, bathing, dressing, toileting and psychosocial support 5. Can the patient actively participate in an intensive therapy program of at least 3 hrs of therapy per day at least 5 days per week? Yes 6. The potential for patient to make measurable gains while on inpatient rehab is excellent 7. Anticipated functional outcomes upon discharge from inpatient rehab are min assist and mod assist  with PT, min assist and mod assist with OT, min assist and mod assist with SLP. 8. Estimated rehab length of stay to reach the above functional goals is: 28-22 days. 9. Does the patient have adequate social supports and living environment to accommodate these discharge functional goals? Potentially 10. Anticipated D/C setting: Home 11. Anticipated post D/C treatments: HH therapy and Home excercise program 12. Overall Rehab/Functional Prognosis: good  RECOMMENDATIONS: This patient's condition is appropriate for  continued rehabilitative care in the following setting: Would consider CIR after identifying caregiver support, however patient and wife consistent on going to SNF closer to home. Patient has agreed to participate in recommended program. No Note that insurance prior authorization may be required for reimbursement for recommended care.  Comment: Rehab Admissions Coordinator to follow up.  Delice Lesch, MD 12/23/2015

## 2015-12-23 NOTE — Progress Notes (Signed)
*  PRELIMINARY RESULTS* Vascular Ultrasound Carotid Duplex (Doppler) has been completed.  Preliminary findings: Bilateral: No significant (1-39%) ICA stenosis. Antegrade vertebral flow.    Landry Mellow, RDMS, RVT  12/23/2015, 11:34 AM

## 2015-12-23 NOTE — Progress Notes (Signed)
Inpatient Rehabilitation  I met with the patient and his wife at the bedside to discuss the recommendation for IP Rehab.  I explained about the program and it's benefits and provided informational booklets.  I answered their questions.  Wife states it would be much more convenient for them to have pt. go to [facility] closer to their home, despite the benefits of CIR.  At this point, I will not initiate insurance authorization process due to pt. and wife preferences.  I will follow up tomorrow in the event pt. and wife have given further consideration for CIR.  Please call if questions.  [place] Admissions Coordinator Cell [phone] Office [phone]

## 2015-12-23 NOTE — Evaluation (Signed)
Occupational Therapy Evaluation Patient Details Name: Philip Moreno MRN: [id number] DOB: [birth year] Today's Date: 12/23/2015    History of Present Illness 75 y.o. male admitted to [facility] on 12/20/15 for AMS, seizure activity.  MRI revealed 3 small areas of acute/subacute ischemic stroke (occipital and frontal lobes) suggestive of emolic event.  Pt also spiked a fever of 101.5 medical workup pending and EEG results pending.  Pt with significant PMHx of HTN, memory loss, TIA, and per family gout.     Clinical Impression   Pt was independent in self care and mobility prior to admission. 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 Pt presents with baseline memory deficit, decreased balance and weakness, particularly of his LEs. He can self feed and groom in sitting and requires min to mod assist for bathing and dressing with heavy reliance on UEs for standing balance. Pt will thrive in an inpatient rehab environment in preparation for return home with his wife. Will follow acutely.    Follow Up Recommendations  CIR    Equipment Recommendations   (defer to next venue)    Recommendations for Other Services       Precautions / Restrictions Precautions Precautions: Fall Restrictions Weight Bearing Restrictions: No      Mobility Bed Mobility Overal bed mobility: Needs Assistance Bed Mobility: Supine to Sit;Sit to Supine     Supine to sit: HOB elevated;Min guard Sit to supine: Min guard   General bed mobility comments: increased time, min guard of trunk for safety  Transfers Overall transfer level: Needs assistance Equipment used: 1 person hand held assist Transfers: Sit to/from Stand Sit to Stand: Mod assist         General transfer comment: assist to rise and steady, pt with posterior bias    Balance     Sitting balance-Leahy Scale: Fair Sitting balance - Comments: no LOB with donning and doffing socks at EOB, guarded for safety      Standing balance-Leahy Scale: Poor                              ADL Overall ADL's : Needs assistance/impaired Eating/Feeding: Independent;Sitting   Grooming: Wash/dry hands;Wash/dry face;Sitting;Supervision/safety   Upper Body Bathing: Minimal assitance;Sitting   Lower Body Bathing: Moderate assistance;Sit to/from stand   Upper Body Dressing : Minimal assistance;Sitting   Lower Body Dressing: Moderate assistance;Sit to/from stand Lower Body Dressing Details (indicate cue type and reason): able to don and doff socks with increased time Toilet Transfer: Moderate assistance;Stand-pivot;BSC                   Vision     Perception     Praxis      Pertinent Vitals/Pain Pain Assessment: No/denies pain     Hand Dominance Right   Extremity/Trunk Assessment Upper Extremity Assessment Upper Extremity Assessment: Overall WFL for tasks assessed   Lower Extremity Assessment Lower Extremity Assessment: Defer to PT evaluation   Cervical / Trunk Assessment Cervical / Trunk Assessment: Normal   Communication Communication Communication: No difficulties   Cognition Arousal/Alertness: Awake/alert Behavior During Therapy: WFL for tasks assessed/performed Overall Cognitive Status: History of cognitive impairments - at baseline                     General Comments       Exercises       Shoulder Instructions  Home Living Family/patient expects to be discharged to:: Private residence Living Arrangements: Spouse/significant other Available Help at Discharge: Family;Available 24 hours/day Type of Home: House Home Access: Stairs to enter [employer] of Steps: 3   Home Layout: One level     Bathroom Shower/Tub: Teacher, early years/pre: Standard     Home Equipment: None      Lives With: Spouse    Prior Functioning/Environment Level of Independence: Independent        Comments: per wife he did not use RW or  any AD at home.  He did all of his ADLs she just has his closet color coordinated so it is easier for him to get dressed, pt still farms and drives short distance     OT Diagnosis: Generalized weakness;Cognitive deficits   OT Problem List: Decreased activity tolerance;Decreased strength;Impaired balance (sitting and/or standing);Decreased cognition;Decreased knowledge of use of DME or AE;Decreased safety awareness;Decreased coordination   OT Treatment/Interventions: Self-care/ADL training;DME and/or AE instruction;Therapeutic activities;Patient/family education;Balance training    OT Goals(Current goals can be found in the care plan section) Acute Rehab OT Goals Patient Stated Goal: return to his active lifestyle OT Goal Formulation: With patient Time For Goal Achievement: 01/06/16 Potential to Achieve Goals: Good ADL Goals Pt Will Perform Grooming: with supervision;standing Pt Will Perform Upper Body Bathing: with supervision;sitting Pt Will Perform Lower Body Bathing: with supervision;sit to/from stand Pt Will Perform Upper Body Dressing: with supervision;sitting Pt Will Perform Lower Body Dressing: with supervision;sit to/from stand Pt Will Transfer to Toilet: with supervision;ambulating Pt Will Perform Toileting - Clothing Manipulation and hygiene: with supervision;sit to/from stand  OT Frequency: Min 2X/week   Barriers to D/C:            Co-evaluation              End of Session Equipment Utilized During Treatment: Gait belt  Activity Tolerance: Patient tolerated treatment well Patient left: in bed;with call bell/phone within reach;with bed alarm set;with family/visitor present   Time: [id number] OT Time Calculation (min): 27 min Charges:  OT General Charges $OT Visit: 1 Procedure OT Evaluation $OT Eval Moderate Complexity: 1 Procedure OT Treatments $Self Care/Home Management : 8-22 mins G-Codes:    Malka So 12/23/2015, 3:51 PM  [phone]

## 2015-12-23 NOTE — Progress Notes (Signed)
[facility]  PROGRESS NOTE  Philip Moreno [id number] DOB: [birth year] DOA: 12/20/2015 PCP: [name], MD    Brief HPI:  75 y.o. male with medical history significant of HTN, dementia, prior TIAs and cerebellar strokes. Patient presented to the ED at [facility] this morning after an episode of seizure activity. Patient has had 3 episodes of seizure activity in past but wasn't started on seizure meds. 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 Patient was admitted to [facility] where he had prolonged post-ictal state and so was transferred [facility] to [facility]. MRI done at [facility] revealed that patient appears to have 3 small areas of acute / subacute ischemic stroke. Occipital and frontal lobe, they suggest possible embolic event. Patient also spiked a fever of 101.5 this afternoon. Blood cultures were drawn and patient given 2gm rocephin prior to transfer.  Principal Problem:   Embolic stroke ([place]) Active Problems:   BP (high blood pressure)   Seizure (HCC)   CKD (chronic kidney disease) stage 3, GFR 30-59 ml/min   Fever, unspecified   CVA (cerebral infarction)   Assessment/Plan: 1. Embolic stroke- MRA brain shows 3 small acute/subacute infarct in the high frontal lobes and right occipital lobe, patient admitted for stroke workup. Echocardiogram showed grade 1 diastolic dysfunction, no cardiac source of emboli, carotid duplex showed no significant stenosis . Neurology following. 2. Fever-  Resolved, likely from gout exacerbation, blood cultures have been obtained and currently negative so far. WBC today is 12.3. Will follow CBC in a.m. Urine was clear, chest x-ray does not show pneumonia. Will observe, no antibiotics at this time.vancomycin was discontinued. 3. Hypokalemia- will replace potassium and check bmp in am. 4. Gout exacerbation-  Improved, patient has flareup of gout in right foot, started  colchicine 0.6 mg by mouth twice a day. 5. Seizure- no more  seizures in the hospital, continue Vimpat, EEG has been  done and results are pending. Neurology following. 6. Hypokalemia- will replace potassium, and check bmp in am.    DVT prophylaxis: Lovenox Code Status: Full code Family Communication: Discussed with patient's wife at bedside  Disposition Plan: Pending workup for stroke, CIR   Consultants:  Neurology   Procedures:  None  Antibiotics:  EEG  Subjective: Patient seen and examined, no seizures since he came to the hospital.he says the right foot pain is better. CIR evaluating for short term rehab Objective: Filed Vitals:   12/22/15 2335 12/23/15 0438 12/23/15 0808 12/23/15 1138  BP: 139/96  147/97 152/92  Pulse: 78  66 75  Temp:  98.4 F (36.9 C) 98 F (36.7 C) 98.4 F (36.9 C)  TempSrc:  Oral Oral Oral  Resp: 22  22 23   Height:      Weight:      SpO2: 94%  93% 96%    Intake/Output Summary (Last 24 hours) at 12/23/15 1245 Last data filed at 12/23/15 1053  Gross per 24 hour  Intake    691 ml  Output   1565 ml  Net   -874 ml   Filed Weights   12/20/15 2100  Weight: 94 kg (207 lb 3.7 oz)    Examination:  General exam: Appears calm and comfortable  Respiratory system: Clear to auscultation. Respiratory effort normal. Cardiovascular system: S1 & S2 heard, RRR. No JVD, murmurs, rubs, gallops or clicks.  Gastrointestinal system: Abdomen is nondistended, soft and nontender. No organomegaly or masses felt. Normal bowel sounds heard. Central nervous system: Alert and oriented. No focal neurological deficits. Extremities: Symmetric 5 x 5 power.  Right ankle warm, tender to touch. No erythema.  Skin: No rashes, lesions or ulcers    Data Reviewed: I have personally reviewed following labs and imaging studies Basic Metabolic Panel:  Recent Labs Lab 12/20/15 0407 12/20/15 1600 12/22/15 0252 12/23/15 0405  NA 138  --  138 137  K 3.5  --  2.8* 3.0*  CL 108  --  105 106  CO2 19*  --  21* 22  GLUCOSE 121*  --  114* 106*  BUN 24*  --  17 13  CREATININE 1.80*   --  1.66* 1.43*  CALCIUM 9.4  --  8.3* 8.5*  MG  --  2.1  --   --   PHOS  --  3.9  --   --    Liver Function Tests:  Recent Labs Lab 12/20/15 0407  AST 29  ALT 30  ALKPHOS 47  BILITOT 0.4  PROT 7.8  ALBUMIN 4.0   No results for input(s): LIPASE, AMYLASE in the last 168 hours. No results for input(s): AMMONIA in the last 168 hours. CBC:  Recent Labs Lab 12/20/15 0407 12/22/15 0252  WBC 7.9 12.3*  NEUTROABS 6.0  --   HGB 14.3 12.6*  HCT 44.2 38.7*  MCV 74.1* 73.4*  PLT 180 176   Cardiac Enzymes:  Recent Labs Lab 12/20/15 0407  TROPONINI <0.03   BNP (last 3 results) No results for input(s): BNP in the last 8760 hours.  ProBNP (last 3 results) No results for input(s): PROBNP in the last 8760 hours.  CBG:  Recent Labs Lab 12/22/15 1259 12/22/15 1721  GLUCAP 112* 109*    Recent Results (from the past 240 hour(s))  CULTURE, BLOOD (ROUTINE X 2) w Reflex to ID Panel     Status: None (Preliminary result)   Collection Time: 12/20/15  6:27 PM  Result Value Ref Range Status   Specimen Description BLOOD RIGHT WRIST  Final   Special Requests BOTTLES DRAWN AEROBIC AND ANAEROBIC 5 CC  Final   Culture NO GROWTH 2 DAYS  Final   Report Status PENDING  Incomplete  CULTURE, BLOOD (ROUTINE X 2) w Reflex to ID Panel     Status: None (Preliminary result)   Collection Time: 12/20/15  6:27 PM  Result Value Ref Range Status   Specimen Description BLOOD LEFT WRIST  Final   Special Requests BOTTLES DRAWN AEROBIC AND ANAEROBIC 1 CC  Final   Culture NO GROWTH 2 DAYS  Final   Report Status PENDING  Incomplete  MRSA PCR Screening     Status: Abnormal   Collection Time: 12/21/15  5:35 AM  Result Value Ref Range Status   MRSA by PCR POSITIVE (A) NEGATIVE Final    Comment:        The GeneXpert MRSA Assay (FDA approved for NASAL specimens only), is one component of a comprehensive MRSA colonization surveillance program. It is not intended to diagnose MRSA infection nor to  guide or monitor treatment for MRSA infections. RESULT CALLED TO, READ BACK BY AND VERIFIED WITH: S.DOTY RN AT 0809 12/21/15 BY A.DAVIS      Studies: No results found.  Scheduled Meds: . [START ON 12/24/2015] Chlorhexidine Gluconate Cloth  6 each Topical Q0600  . clopidogrel  75 mg Oral Daily  . colchicine  0.6 mg Oral BID  . donepezil  5 mg Oral Daily  . enoxaparin (LOVENOX) injection  40 mg Subcutaneous Q24H  . fluticasone  2 spray Each Nare Daily  . lacosamide  100  mg Oral BID  . memantine  28 mg Oral Daily  . mupirocin ointment  1 application Nasal BID  . potassium chloride  10 mEq Intravenous Q1 Hr x 3  . QUEtiapine  25 mg Oral QHS   Continuous Infusions: . sodium chloride 100 mL (12/22/15 1400)       Time spent: 20 min    [facility] Pager [phone]. If 7PM-7AM, please contact night-coverage at [email or web address], Office  [phone]  password [email or web address] 12/23/2015, 12:45 PM  LOS: 3 days

## 2015-12-23 NOTE — Clinical Social Work Note (Signed)
Clinical Social Work Assessment  Patient Details  Name: Philip Moreno MRN: [id number] Date of Birth: 1941/01/14  Date of referral:  12/23/15               Reason for consult:  Facility Placement, Discharge Planning                Permission sought to share information with:  Facility Sport and exercise psychologist, Family Supports Permission granted to share information::  Yes, Verbal Permission Granted  Name::     Cincere Zorn  Agency::  SNF's  Relationship::  Wife  Contact Information:  [phone]  Housing/Transportation Living arrangements for the past 2 months:  Single Family Home Source of Information:  Patient, Spouse, Medical Team Patient Interpreter Needed:  None Criminal Activity/Legal Involvement Pertinent to Current Situation/Hospitalization:  No - Comment as needed Significant Relationships:  Spouse, Other Family Members Lives with:  Spouse Do you feel safe going back to the place where you live?  Yes Need for family participation in patient care:  Yes (Comment)  Care giving concerns:  PT recommending CIR but patient's wife wants him to be closer to home in [facility].   Social Worker assessment / plan:  CSW met with patient. Wife at bedside. CSW introduced role and explained that discharge planning would be discussed. Patient is recommended for CIR but his wife wants him to be closer to home in [facility]. CSW gave patient's wife SNF list. She gave verbal permission to fax information to all SNF's in [facility]. Patient will need PTAR. No further concerns. CSW encouraged patient and his wife to contact CSW as needed. CSW will continue to follow patient for support and facilitate discharge to SNF once medically stable.  Employment status:  Retired Nurse, adult PT Recommendations:  Inpatient [place] / Referral to community resources:  [place]  Patient/Family's Response to care:  Patient and his wife  agreeable to SNF placement. Patient's family supportive and involved in patient's care. Patient and his wife polite and appreciated social work intervention.   Patient/Family's Understanding of and Emotional Response to Diagnosis, Current Treatment, and Prognosis:  Patient and family knowledgeable of medical interventions and aware of possible discharge to SNF once medically stable.  Emotional Assessment Appearance:  Appears stated age Attitude/Demeanor/Rapport:   (Pleasant) Affect (typically observed):  Accepting, Appropriate, Calm, Pleasant Orientation:  Oriented to Self, Oriented to Place Alcohol / Substance use:  Never Used Psych involvement (Current and /or in the community):  No (Comment)  Discharge Needs  Concerns to be addressed:  Care Coordination Readmission within the last 30 days:  No Current discharge risk:  Dependent with Mobility, Cognitively Impaired Barriers to Discharge:  No Barriers Identified   Candie Chroman, LCSW 12/23/2015, 4:00 PM

## 2015-12-23 NOTE — Progress Notes (Signed)
STROKE TEAM PROGRESS NOTE   SUBJECTIVE (INTERVAL HISTORY) His wife is at the bedside.  Overall his condition is gradually improving. Afebrile and right foot pain is much improved after colchicine. CUS negative. Pending CIR.    OBJECTIVE Temp:  [98 F (36.7 C)-99.8 F (37.7 C)] 98.4 F (36.9 C) (05/22 1138) Pulse Rate:  [66-93] 75 (05/22 1138) Cardiac Rhythm:  [-] Normal sinus rhythm (05/22 1215) Resp:  [22-34] 23 (05/22 1138) BP: (139-160)/(92-108) 152/92 mmHg (05/22 1138) SpO2:  [93 %-96 %] 96 % (05/22 1138)  CBC:   Recent Labs Lab 12/20/15 0407 12/22/15 0252  WBC 7.9 12.3*  NEUTROABS 6.0  --   HGB 14.3 12.6*  HCT 44.2 38.7*  MCV 74.1* 73.4*  PLT 180 0000000    Basic Metabolic Panel:   Recent Labs Lab 12/20/15 1600 12/22/15 0252 12/23/15 0405  NA  --  138 137  K  --  2.8* 3.0*  CL  --  105 106  CO2  --  21* 22  GLUCOSE  --  114* 106*  BUN  --  17 13  CREATININE  --  1.66* 1.43*  CALCIUM  --  8.3* 8.5*  MG 2.1  --   --   PHOS 3.9  --   --     Lipid Panel:     Component Value Date/Time   CHOL 94 12/21/2015 0354   TRIG 50 12/21/2015 0354   HDL 36* 12/21/2015 0354   CHOLHDL 2.6 12/21/2015 0354   VLDL 10 12/21/2015 0354   LDLCALC 48 12/21/2015 0354   HgbA1c:  Lab Results  Component Value Date   HGBA1C 6.4* 12/21/2015   Urine Drug Screen:     Component Value Date/Time   LABOPIA NONE DETECTED 12/20/2015 0439   COCAINSCRNUR NONE DETECTED 12/20/2015 0439   LABBENZ NONE DETECTED 12/20/2015 0439   AMPHETMU NONE DETECTED 12/20/2015 0439   THCU NONE DETECTED 12/20/2015 0439   LABBARB NONE DETECTED 12/20/2015 0439      IMAGING I have personally reviewed the radiological images below and agree with the radiology interpretations.  Ct Head Wo Contrast 12/20/2015   No acute intracranial abnormalities. Chronic atrophy and small vessel ischemic changes.   Mr Philip Moreno Contrast 12/20/2015   1. Three small acute/early subacute infarcts in the high frontal  lobes and right occipital lobe, emboli versus watershed ischemia.  2. Progressive, mild-to-moderate chronic small vessel ischemic disease including new small chronic cortical infarcts in the high frontal lobes.  3. Remote left occipital and bilateral cerebellar infarcts.   Dg Chest Port 1 View 12/20/2015   Linear atelectasis in the lung bases.   EEG - pending  Mra Head Wo Contrast 12/21/2015  IMPRESSION: 1. The study is severely degraded by patient motion through the cavernous internal carotid arteries and basilar artery. No comments can be made on these areas. 2. The A1 and M1 segments and proximal posterior cerebral arteries are intact. 3. Moderate attenuation of MCA and distal PCA branch vessels may be exaggerated by patient motion.   CUS - Bilateral: 1-39% ICA stenosis. Vertebral artery flow is antegrade.  TTE - - Procedure narrative: Transthoracic echocardiography. Image  quality was adequate. The study was technically difficult. - Left ventricle: The cavity size was normal. There was mild  concentric hypertrophy. Systolic function was normal. The  estimated ejection fraction was in the range of 55% to 60%.  Doppler parameters are consistent with abnormal left ventricular  relaxation (grade 1 diastolic dysfunction). - Aortic valve: There  was trivial regurgitation. Impressions: - No cardiac source of emboli was indentified.   PHYSICAL EXAM  Temp:  [98 F (36.7 C)-99.8 F (37.7 C)] 98.4 F (36.9 C) (05/22 1138) Pulse Rate:  [66-93] 75 (05/22 1138) Resp:  [22-34] 23 (05/22 1138) BP: (139-160)/(92-108) 152/92 mmHg (05/22 1138) SpO2:  [93 %-96 %] 96 % (05/22 1138)  General - Well nourished, well developed, in no apparent distress, sitting in chair.  Ophthalmologic - Fundi not visualized due to noncooperation.  Cardiovascular - Regular rate and rhythm.  Mental Status -  Level of arousal and orientation to place, and person were intact, but not orientated to  time. Language including expression, naming, repetition, comprehension was assessed and found intact, mild dysarthria. Fund of Knowledge was assessed and was impaired.  Cranial Nerves II - XII - II - Visual field intact OU. III, IV, VI - Extraocular movements intact. V - Facial sensation intact bilaterally. VII - Facial movement intact bilaterally. VIII - Hearing & vestibular intact bilaterally. X - Palate elevates symmetrically, mild dysarthria. XI - Chin turning & shoulder shrug intact bilaterally. XII - Tongue protrusion intact.  Motor Strength - The patient's strength was normal in all extremities except right DF difficulty due to pain in the ankle due to gout and pronator drift was absent.  Bulk was normal and fasciculations were absent.   Motor Tone - Muscle tone was assessed at the neck and appendages and was normal.  Reflexes - The patient's reflexes were 1+ in all extremities and he had no pathological reflexes.  Sensory - Light touch, temperature/pinprick were assessed and were symmetrical.    Coordination - The patient had normal movements in the hands with no ataxia or dysmetria.  Tremor was absent.  Gait and Station - deferred due to safety concerns.   ASSESSMENT/PLAN [name] is a 75 y.o. male with history of hypertension, dementia, prostate cancer, previous TIA presenting with new onset seizures.  He did not receive IV t-PA due to unknown time of onset.  Seizure  Had 3 episodes in the past, no AED initiated  Loaded with vimpat and now on maintenance vimpat 100mg  bid  EEG pending  Continue vimpat  MRI with and without contrast did not show tumor or structural lesion to explain seizure  May be related to dementia  Follow up with Dr. Jaynee Moreno as outpt  Stroke:  Bilateral MCA/ACA, and right MCA/PCA watershed infarcts, embolic vs hypoperfusion/hypoxia. This can be related to hypoxia during seizure or cardioembolic. We recommend BP control and 30 day  cardiac event monitoring.  Resultant  Back to baseline  MRI  Three small acute/early subacute infarcts in the high frontal lobes and right occipital lobe, watershed distribution. Old cerebellar punctate infarcts bilaterally with questionable old left MCA/PCA infarct.   MRA - motion degraded with no proximal LVO  Carotid Doppler unremarkable  Blood cultures - NGTD, will consider TEE if blood Cx positive  2D Echo unremarkable EF 55-60%  Recommend 30 day cardiac event monitoring as outpt to rule out afib  LDL - 48  HgbA1c 6.4  VTE prophylaxis - SCDs Diet regular Room service appropriate?: Yes; Fluid consistency:: Thin  aspirin 81 mg daily prior to admission, now on plavix for stroke prevention.   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 Jaynee Moreno after discharge.   Hospital day # 3   Neurology will sign off. Please call with questions. Pt will follow up with Dr. Jaynee Moreno at [facility] in about 2 months. Thanks for the consult.   Rosalin Hawking, MD PhD Stroke Neurology 12/23/2015 6:49 PM      To contact Stroke Continuity provider, please refer to [email or web address] After hours, contact General Neurology

## 2015-12-24 ENCOUNTER — Encounter
Admission: RE | Admit: 2015-12-24 | Discharge: 2015-12-24 | Disposition: A | Discharge status: Home / Self Care | Payer: Medicare Other | Source: Ambulatory Visit | Attending: Internal Medicine | Admitting: Internal Medicine

## 2015-12-24 DIAGNOSIS — R5383 Other fatigue: Secondary | ICD-10-CM | POA: Insufficient documentation

## 2015-12-24 LAB — BASIC METABOLIC PANEL
ANION GAP: 9 (ref 5–15)
BUN: 16 mg/dL (ref 6–20)
CALCIUM: 8.6 mg/dL — AB (ref 8.9–10.3)
CHLORIDE: 105 mmol/L (ref 101–111)
CO2: 21 mmol/L — AB (ref 22–32)
Creatinine, Ser: 1.32 mg/dL — ABNORMAL HIGH (ref 0.61–1.24)
GFR calc non Af Amer: 52 mL/min — ABNORMAL LOW (ref >60)
Glucose, Bld: 99 mg/dL (ref 65–99)
Potassium: 3.5 mmol/L (ref 3.5–5.1)
Sodium: 135 mmol/L (ref 135–145)

## 2015-12-24 LAB — CBC
HCT: 40.7 % (ref 39.0–52.0)
HEMOGLOBIN: 13.1 g/dL (ref 13.0–17.0)
MCH: 24.2 pg — ABNORMAL LOW (ref 26.0–34.0)
MCHC: 32.2 g/dL (ref 30.0–36.0)
MCV: 75.2 fL — ABNORMAL LOW (ref 78.0–100.0)
Platelets: 202 10*3/uL (ref 150–400)
RBC: 5.41 MIL/uL (ref 4.22–5.81)
RDW: 17.5 % — ABNORMAL HIGH (ref 11.5–15.5)
WBC: 9 10*3/uL (ref 4.0–10.5)

## 2015-12-24 MED ORDER — SENNOSIDES-DOCUSATE SODIUM 8.6-50 MG PO TABS: 1.0000 | ORAL_TABLET | Freq: Every evening | ORAL | Status: DC | PRN

## 2015-12-24 MED ORDER — COLCHICINE 0.6 MG PO TABS: 0.6000 mg | ORAL_TABLET | Freq: Two times a day (BID) | ORAL | Status: DC | PRN

## 2015-12-24 MED ORDER — CLOPIDOGREL BISULFATE 75 MG PO TABS: 75.0000 mg | ORAL_TABLET | Freq: Every day | ORAL | Status: DC

## 2015-12-24 MED ORDER — QUETIAPINE FUMARATE 25 MG PO TABS: 25.0000 mg | ORAL_TABLET | Freq: Every day | ORAL | Status: DC

## 2015-12-24 NOTE — Discharge Summary (Signed)
Physician Discharge Summary  Philip Moreno [id number] DOB: 1940/10/21 DOA: 12/20/2015  PCP: [name], MD  Admit date: 12/20/2015 Discharge date: 12/24/2015  Time spent: 35* minutes  Recommendations for Outpatient Follow-up:  1. Cardiology will contact  you for 30 day event  monitor as outpatient 2. Changed aspirin to Plavix for stroke prevention 3. Continue colchicine when necessary for gout 4. Follow final blood culture results 5. Follow-up PCP in 2 weeks   Discharge Diagnoses:  Principal Problem:   Embolic stroke ([place]) Active Problems:   BP (high blood pressure)   Seizure (HCC)   CKD (chronic kidney disease) stage 3, GFR 30-59 ml/min   Fever, unspecified   CVA (cerebral infarction)   Gout attack   History of TIA (transient ischemic attack)   Dementia   Tachypnea   Pyrexia   Disorientation   Hypokalemia   AKI (acute kidney injury) (HCC)   Leukocytosis   Acute blood loss anemia   SIRS (systemic inflammatory response syndrome) ([place])   Discharge Condition: Stable  Diet recommendation: Heart healthy diet  Filed Weights   12/20/15 2100  Weight: 94 kg (207 lb 3.7 oz)    History of present illness:  75 y.o. male with medical history significant of HTN, dementia, prior TIAs and cerebellar strokes. Patient presented to the ED at [facility] this morning after an episode of seizure activity. Patient has had 3 episodes of seizure activity in past but wasn't started on seizure meds. 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 Patient was admitted to [facility] where he had prolonged post-ictal state and so was transferred [facility]. MRI done at [facility] revealed that patient appears to have 3 small areas of acute / subacute ischemic stroke. Occipital and frontal lobe, they suggest possible embolic event. Patient also spiked a fever of 101.5 this afternoon. Blood cultures were drawn and patient given 2gm rocephin prior to transfer.  Hospital Course:  1. Embolic stroke- MRI brain shows 3 small  acute/subacute infarct in the high frontal lobes and right occipital lobe, patient admitted for stroke workup. Echocardiogram showed grade 1 diastolic dysfunction, no cardiac source of emboli, carotid duplex showed no significant stenosis . Neurology changed aspirin  To Plavix. Patient will need 30 day event cardiac monitor to rule out underlying arrhythmia. Consulted cardiology for set up event monitor as outpatient. Cardiology will contact as outpatient 2. Fever- Resolved, likely from gout exacerbation, blood cultures have been obtained and currently negative so far. WBC today is 9.0 Will follow CBC in a.m. Urine was clear, chest x-ray does not show pneumonia. Will observe, no antibiotics at this time.vancomycin was discontinued. 3. Hypokalemia- replaced, potassium was 3.5. 4. Gout exacerbation- Improved, patient has flareup of gout in right foot, started colchicine 0.6 mg by mouth twice a day. 5. Seizure- no more seizures in the hospital, continue Vimpat, EEG is negative for seizure, shows mild nonspecific cerebral dysfunction likely from underlying dementia 6. Hypertension- will restart home medications including Toprol XL, lisinopril  Procedures:  EEG  Echocardiogram  Carotid duplex  Consultations:  Neurology  Discharge Exam: Filed Vitals:   12/24/15 1210 12/24/15 1401  BP:  147/100  Pulse:  101  Temp: 98.5 F (36.9 C)   Resp:  29    General: *Appeared in no acute distress Cardiovascular: S1-S2 regular Respiratory: Clear to auscultation bilaterally  Discharge Instructions   Discharge Instructions    Ambulatory referral to Neurology    Complete by:  As directed   Follow up with Dr. Jaynee Eagles at [facility] in 2  months. Pt saw Dr. Jaynee Eagles in the past. Thanks.     Diet - low sodium heart healthy    Complete by:  As directed      Increase activity slowly    Complete by:  As directed           Current Discharge Medication List    START taking these medications   Details   clopidogrel (PLAVIX) 75 MG tablet Take 1 tablet (75 mg total) by mouth daily. Qty: 30 tablet, Refills: 2    colchicine 0.6 MG tablet Take 1 tablet (0.6 mg total) by mouth 2 (two) times daily as needed.    senna-docusate (SENOKOT-S) 8.6-50 MG tablet Take 1 tablet by mouth at bedtime as needed for mild constipation.      CONTINUE these medications which have CHANGED   Details  QUEtiapine (SEROQUEL) 25 MG tablet Take 1 tablet (25 mg total) by mouth at bedtime. Qty: 30 tablet, Refills: 2      CONTINUE these medications which have NOT CHANGED   Details  acetaminophen (TYLENOL) 325 MG tablet Take 2 tablets (650 mg total) by mouth every 6 (six) hours as needed for mild pain (or Fever >/= 101).    donepezil (ARICEPT) 5 MG tablet Take 1 tablet (5 mg total) by mouth daily. Qty: 30 tablet, Refills: 11    fluticasone (FLONASE) 50 MCG/ACT nasal spray Place 2 sprays into both nostrils daily. Qty: 16 g, Refills: 2    lacosamide 100 MG TABS Take 1 tablet (100 mg total) by mouth 2 (two) times daily. Qty: 60 tablet    lisinopril (PRINIVIL,ZESTRIL) 20 MG tablet Take 20 mg by mouth daily.    memantine (NAMENDA XR) 28 MG CP24 24 hr capsule Take 1 capsule (28 mg total) by mouth daily. Qty: 30 capsule, Refills: 11    metoprolol succinate (TOPROL-XL) 100 MG 24 hr tablet Take 100 mg by mouth daily. Take with or immediately following a meal.    sodium chloride 0.9 % infusion Inject 100 mLs into the vein continuous. Refills: 0      STOP taking these medications     aspirin EC 81 MG EC tablet      cefTRIAXone 2 g in dextrose 5 % 50 mL        No Known Allergies Follow-up Information    Follow up with Melvenia Beam, MD. Schedule an appointment as soon as possible for a visit in 2 months.   Specialty:  Neurology   Contact information:   [address] [place] [address] [phone]        The results of significant diagnostics from this hospitalization (including imaging,  microbiology, ancillary and laboratory) are listed below for reference.    Significant Diagnostic Studies: Ct Head Wo Contrast  12/20/2015  CLINICAL DATA:  Possible seizure. Shallow abrasions to the top of the head. Baseline dementia. EXAM: CT HEAD WITHOUT CONTRAST TECHNIQUE: Contiguous axial images were obtained from the base of the skull through the vertex without intravenous contrast. COMPARISON:  10/17/2015 FINDINGS: Diffuse cerebral atrophy. Low-attenuation changes in the deep white matter consistent with small vessel ischemia. Ventricular dilatation consistent with central atrophy. No mass effect or midline shift. No abnormal extra-axial fluid collections. Gray-white matter junctions are distinct. Basal cisterns are not effaced. No evidence of acute intracranial hemorrhage. No depressed skull fractures. Visualized paranasal sinuses and mastoid air cells are not opacified. Focal sclerosis again demonstrated in the right temporal bone without expansion or destruction. IMPRESSION: No acute  intracranial abnormalities. Chronic atrophy and small vessel ischemic changes. Electronically Signed   By: Lucienne Capers M.D.   On: 12/20/2015 04:32   Mr Jodene Nam Head Wo Contrast  12/21/2015  CLINICAL DATA:  Acute punctate nonhemorrhagic infarcts involving the high frontal lobes bilaterally and the right occipital lobe. Abnormal MRI. EXAM: MRA HEAD WITHOUT CONTRAST TECHNIQUE: Angiographic images of the Circle of Willis were obtained using MRA technique without intravenous contrast. COMPARISON:  MRI brain 12/20/2015. CT head without contrast 12/20/2015. FINDINGS: The study is severely degraded by patient motion. This limits evaluation of small vessels. There is significant signal loss due to patient motion through the cavernous internal carotid arteries bilaterally. These areas cannot be evaluated. The terminal ICA is intact bilaterally. The A1 and M1 segments appear to be intact. There is significant attenuation of MCA  and ACA branch vessels bilaterally, likely exaggerated by patient motion. The right vertebral artery is the dominant vessel. The vertebrobasilar junction is intact. The basilar artery is obscured by patient motion. Both posterior cerebral arteries originate from the basilar tip. Significant signal loss in the left posterior cerebral artery may be exaggerated by motion. There is some attenuation of distal small vessels. IMPRESSION: 1. The study is severely degraded by patient motion through the cavernous internal carotid arteries and basilar artery. No comments can be made on these areas. 2. The A1 and M1 segments and proximal posterior cerebral arteries are intact. 3. Moderate attenuation of MCA and distal PCA branch vessels may be exaggerated by patient motion. Electronically Signed   By: San Morelle M.D.   On: 12/21/2015 10:49   Mr Jeri Cos [id number] Contrast  12/20/2015  CLINICAL DATA:  Seizures. EXAM: MRI HEAD WITHOUT AND WITH CONTRAST TECHNIQUE: Multiplanar, multiecho pulse sequences of the brain and surrounding structures were obtained without and with intravenous contrast. CONTRAST:  6mL MULTIHANCE GADOBENATE DIMEGLUMINE 529 MG/ML IV SOLN COMPARISON:  Head CT 12/20/2015 and MRI 05/08/2014 FINDINGS: Multiple sequences are mildly to moderately motion degraded despite the patient receiving anxiolysis, repeat imaging, and utilization of more motion resistant imaging protocols. There are 3 subcentimeter foci of cortical/subcortical diffusion abnormality which are located in the high frontal lobes and right occipital lobe. There is clearly reduced ADC in the right frontal and right occipital lobes. Reduced ADC is not confirmed in the left frontal lobe. No intracranial hemorrhage, mass, midline shift, or extra-axial fluid collection is identified. There are multiple small chronic cortical infarcts in the high frontal lobes which are new/ increased from the prior MRI and could reflect sequelae of watershed  ischemia given their orientation versus emboli. Patchy T2 hyperintensities in the frontoparietal white matter have also increased from the prior MRI. A chronic, small cortical infarct in the left occipital lobe is unchanged. Numerous chronic infarcts are again seen throughout both cerebellar hemispheres as well as in the left thalamus. No abnormal enhancement is identified within limitations of motion artifact. There is moderate cerebral atrophy. Orbits are grossly unremarkable. Paranasal sinuses and mastoid air cells are clear. Major intracranial vascular flow voids are preserved. IMPRESSION: 1. Three small acute/early subacute infarcts in the high frontal lobes and right occipital lobe, emboli versus watershed ischemia. 2. Progressive, mild-to-moderate chronic small vessel ischemic disease including new small chronic cortical infarcts in the high frontal lobes. 3. Remote left occipital and bilateral cerebellar infarcts. Electronically Signed   By: Logan Bores M.D.   On: 12/20/2015 17:36   Dg Chest Port 1 View  12/20/2015  CLINICAL DATA:  Possible seizure. EXAM: PORTABLE  CHEST 1 VIEW COMPARISON:  08/18/2012 FINDINGS: Shallow inspiration with mild linear atelectasis in the lung bases. The heart size and mediastinal contours are within normal limits. Both lungs are clear. The visualized skeletal structures are unremarkable. IMPRESSION: Linear atelectasis in the lung bases. Electronically Signed   By: Lucienne Capers M.D.   On: 12/20/2015 04:41    Microbiology: Recent Results (from the past 240 hour(s))  CULTURE, BLOOD (ROUTINE X 2) w Reflex to ID Panel     Status: None (Preliminary result)   Collection Time: 12/20/15  6:27 PM  Result Value Ref Range Status   Specimen Description BLOOD RIGHT WRIST  Final   Special Requests BOTTLES DRAWN AEROBIC AND ANAEROBIC 5 CC  Final   Culture NO GROWTH 4 DAYS  Final   Report Status PENDING  Incomplete  CULTURE, BLOOD (ROUTINE X 2) w Reflex to ID Panel     Status:  None (Preliminary result)   Collection Time: 12/20/15  6:27 PM  Result Value Ref Range Status   Specimen Description BLOOD LEFT WRIST  Final   Special Requests BOTTLES DRAWN AEROBIC AND ANAEROBIC 1 CC  Final   Culture NO GROWTH 4 DAYS  Final   Report Status PENDING  Incomplete  MRSA PCR Screening     Status: Abnormal   Collection Time: 12/21/15  5:35 AM  Result Value Ref Range Status   MRSA by PCR POSITIVE (A) NEGATIVE Final    Comment:        The GeneXpert MRSA Assay (FDA approved for NASAL specimens only), is one component of a comprehensive MRSA colonization surveillance program. It is not intended to diagnose MRSA infection nor to guide or monitor treatment for MRSA infections. RESULT CALLED TO, READ BACK BY AND VERIFIED WITH: S.DOTY RN AT 0809 12/21/15 BY A.DAVIS      Labs: Basic Metabolic Panel:  Recent Labs Lab 12/20/15 0407 12/20/15 1600 12/22/15 0252 12/23/15 0405 12/24/15 0421  NA 138  --  138 137 135  K 3.5  --  2.8* 3.0* 3.5  CL 108  --  105 106 105  CO2 19*  --  21* 22 21*  GLUCOSE 121*  --  114* 106* 99  BUN 24*  --  17 13 16   CREATININE 1.80*  --  1.66* 1.43* 1.32*  CALCIUM 9.4  --  8.3* 8.5* 8.6*  MG  --  2.1  --   --   --   PHOS  --  3.9  --   --   --    Liver Function Tests:  Recent Labs Lab 12/20/15 0407  AST 29  ALT 30  ALKPHOS 47  BILITOT 0.4  PROT 7.8  ALBUMIN 4.0   No results for input(s): LIPASE, AMYLASE in the last 168 hours. No results for input(s): AMMONIA in the last 168 hours. CBC:  Recent Labs Lab 12/20/15 0407 12/22/15 0252 12/24/15 0421  WBC 7.9 12.3* 9.0  NEUTROABS 6.0  --   --   HGB 14.3 12.6* 13.1  HCT 44.2 38.7* 40.7  MCV 74.1* 73.4* 75.2*  PLT 180 176 202   Cardiac Enzymes:  Recent Labs Lab 12/20/15 0407  TROPONINI <0.03    CBG:  Recent Labs Lab 12/22/15 1259 12/22/15 1721  GLUCAP 112* 109*       Signed:  Eleonore Chiquito S MD.  [facility] 12/24/2015, 3:33 PM

## 2015-12-24 NOTE — Care Management Note (Signed)
Case Management Note  Patient Details  Name: Philip Moreno MRN: [id number] Date of Birth: [birth year]  Subjective/Objective:      Patient for dc to snf today, CSW following.              Action/Plan:   Expected Discharge Date:                  Expected Discharge Plan:  Skilled Nursing Facility  In-House Referral:  Clinical Social Work  Discharge planning Services  CM Consult  Post Acute Care Choice:    Choice offered to:     DME Arranged:    DME Agency:     HH Arranged:    [place] Agency:     Status of Service:  Completed, signed off  Medicare Important Message Given:  Yes Date Medicare IM Given:    Medicare IM give by:    Date Additional Medicare IM Given:    Additional Medicare Important Message give by:     If discussed at [place] of Stay Meetings, dates discussed:    Additional Comments:  Zenon Mayo, RN 12/24/2015, 4:02 PM

## 2015-12-24 NOTE — Care Management Important Message (Signed)
Important Message  Patient Details  Name: ZEON MIYAHIRA MRN: [id number] Date of Birth: 1940-08-16   Medicare Important Message Given:  Yes    Zenon Mayo, RN 12/24/2015, 1:11 [place] Message  Patient Details  Name: RONY HOEFER MRN: [id number] Date of Birth: 05/07/1941   Medicare Important Message Given:  Yes    Zenon Mayo, RN 12/24/2015, 1:11 PM

## 2015-12-24 NOTE — Progress Notes (Signed)
Physical Therapy Treatment Patient Details Name: Philip Moreno MRN: [id number] DOB: 05/10/1941 Today's Date: 12/24/2015    History of Present Illness 75 y.o. male admitted to [facility] on 12/20/15 for AMS, seizure activity.  MRI revealed 3 small areas of acute/subacute ischemic stroke (occipital and frontal lobes) suggestive of emolic event.  Pt also spiked a fever of 101.5 medical workup pending and EEG results pending.  Pt with significant PMHx of HTN, memory loss, TIA, and per family gout.      PT Comments    Pt with less right foot pain today, however, still very unsteady on his feet with significant posterior lean in standing.  With gait leans to the right, but posterior lean better once forward momentum established.  Pt continues to be appropriate for post acute rehab.    Follow Up Recommendations  CIR     Equipment Recommendations  Rolling walker with 5" wheels;Wheelchair (measurements PT);Wheelchair cushion (measurements PT);3in1 (PT)    Recommendations for Other Services Rehab consult     Precautions / Restrictions Precautions Precautions: Fall Precaution Comments: posterior R lean with mobility Restrictions Weight Bearing Restrictions: No    Mobility  Bed Mobility Overal bed mobility: Needs Assistance Bed Mobility: Supine to Sit     Supine to sit: Supervision;HOB elevated     General bed mobility comments: Supervision for safety, verbal cues for hand placement on railing for support to pull up to sitting.  Significant extra time to complete task and multiple attempts with posterior LOB before he was successful.   Transfers Overall transfer level: Needs assistance Equipment used: Rolling walker (2 wheeled) Transfers: Sit to/from Stand Sit to Stand: +2 physical assistance;Mod assist         General transfer comment: Two person mod assist for initial stand with significant initial posterior lean.  Verbal cues to get feet underneath him and manual assist for pt to find  RW with hands.  Using a RW to stand and for gait is a novel task and with his PMHx of dementia this may make using it more complicated/difficult.   Ambulation/Gait Ambulation/Gait assistance: +2 safety/equipment;Mod assist Ambulation Distance (Feet): 85 Feet Assistive device: Rolling walker (2 wheeled) Gait Pattern/deviations: Step-through pattern;Drifts right/left Gait velocity: decreased Gait velocity interpretation: Below normal speed for age/gender General Gait Details: Used RW and chair to follow for gait.  Once forward momentum established, pt able to decrease posterior lean, however continued to have R lateral trunk lean with right hand grip supinated on RW vs left hand which was extended at the wrist and stable.  I re tested wrist and hand strength Bil WNL, coordination WNL, and in fact, left shoulder was weaker (seemed sore to pt with inability to smoothly lower once he was helped to lift it above his head).      Modified Rankin (Stroke Patients Only) Modified Rankin (Stroke Patients Only) Pre-Morbid Rankin Score: No symptoms Modified Rankin: Moderately severe disability     Balance Overall balance assessment: Needs assistance Sitting-balance support: Feet supported;Bilateral upper extremity supported;No upper extremity supported Sitting balance-Leahy Scale: Fair Sitting balance - Comments: When handed his pain meds and a cup, pt let go of the EOB and started to slowly lean posteriorly while taking meds, able to correct once he had his hands back on RW.  Postural control: Posterior lean;Right lateral lean Standing balance support: Bilateral upper extremity supported Standing balance-Leahy Scale: Poor  Cognition Arousal/Alertness: Awake/alert Behavior During Therapy: WFL for tasks assessed/performed Overall Cognitive Status: History of cognitive impairments - at baseline                             Pertinent Vitals/Pain Pain  Assessment: Faces Faces Pain Scale: Hurts little more Pain Location: head Pain Descriptors / Indicators: Aching Pain Intervention(s): Limited activity within patient's tolerance;Monitored during session;Repositioned;Patient requesting pain meds-RN notified;RN gave pain meds during session           PT Goals (current goals can now be found in the care plan section) Acute Rehab PT Goals Patient Stated Goal: return to his active lifestyle Progress towards PT goals: Progressing toward goals    Frequency  Min 3X/week    PT Plan Current plan remains appropriate       End of Session Equipment Utilized During Treatment: Gait belt Activity Tolerance: Patient limited by fatigue;Patient limited by pain Patient left: in chair;with call bell/phone within reach;with chair alarm set;with family/visitor present     Time: 1349-1418 PT Time Calculation (min) (ACUTE ONLY): 29 min  Charges:  $Gait Training: 8-22 mins $Therapeutic Activity: 8-22 mins                     Philip Moreno, Philip Moreno, DPT [phone]   12/24/2015, 2:32 PM

## 2015-12-24 NOTE — Progress Notes (Signed)
Discharge instructions provided to patient and wife.  Patient will be going to [facility] in [place] for rehab.  Report has been attempted multiple times, awaiting call back from RN.  Neither patient nor wife have any questions at this time.  Will continue to monitor patient until transport arrives.

## 2015-12-24 NOTE — Clinical Social Work Placement (Signed)
   CLINICAL SOCIAL WORK PLACEMENT  NOTE  Date:  12/24/2015  Patient Details  Name: Philip Moreno MRN: [id number] Date of Birth: 22-Oct-1940  Clinical Social Work is seeking post-discharge placement for this patient at the [place] level of care (*CSW will initial, date and re-position this form in  chart as items are completed):  Yes   Patient/family provided with [place] Work Department's list of facilities offering this level of care within the geographic area requested by the patient (or if unable, by the patient's family).  Yes   Patient/family informed of their freedom to choose among providers that offer the needed level of care, that participate in Medicare, Medicaid or managed care program needed by the patient, have an available bed and are willing to accept the patient.  Yes   Patient/family informed of [facility]'s ownership interest in [facility] and [facility], as well as of the fact that they are under no obligation to receive care at these facilities.  PASRR submitted to EDS on       PASRR number received on 12/23/15     Existing PASRR number confirmed on       FL2 transmitted to all facilities in geographic area requested by pt/family on 12/23/15     FL2 transmitted to all facilities within larger geographic area on       Patient informed that his/her managed care company has contracts with or will negotiate with certain facilities, including the following:        Yes   Patient/family informed of bed offers received.  Patient chooses bed at [facility]     Physician recommends and patient chooses bed at      Patient to be transferred to [facility] on 12/24/15.  Patient to be transferred to facility by [facility]     Patient family notified on 12/24/15 of transfer.  Name of family member notified:  [name]     PHYSICIAN Please prepare prescriptions, Please sign FL2     Additional Comment:     _______________________________________________ Candie Chroman, LCSW 12/24/2015, 3:58 PM

## 2015-12-24 NOTE — Clinical Social Work Note (Signed)
CSW facilitated patient discharge including contacting patient family and facility to confirm patient discharge plans. Clinical information faxed to facility and family agreeable with plan. CSW arranged ambulance transport via [facility] to [facility]. RN to call report prior to discharge [phone]. Room 220B).  CSW will sign off for now as social work intervention is no longer needed. Please consult [place] again if new needs arise.  Dayton Scrape, [place]

## 2015-12-24 NOTE — Procedures (Signed)
History: 75 yo M with strokes, seizure  Sedation: None  Technique: This is a 21 channel routine scalp EEG performed at the bedside with bipolar and monopolar montages arranged in accordance to the international 10/20 system of electrode placement. One channel was dedicated to EKG recording.    Background: The background consists predominantly of intermixed alpha and beta activities. There is a well defined posterior dominant rhythm of 9 Hz that attenuates with eye opening. There is mild diffuse irregular slow activity even during maximal wakefulness.    Photic stimulation: Physiologic driving is not performed  EEG Abnormalities: 1) mild generalized irregular slow activity  Clinical Interpretation: This EEG is consistent with a mild generalized non-specific cerebral dysfunction(encephalopathy). There was no seizure or seizure predisposition recorded on this study. Please note that a normal EEG does not preclude the possibility of epilepsy.   Roland Rack, MD [facility] [phone]  If 7pm- 7am, please page neurology on call as listed in [place].

## 2015-12-24 NOTE — Progress Notes (Signed)
I spoke with pt/wife again today about pt's post acute rehab needs.  Wife maintains that she wants pt. closer to home and does not want to consider CIR.  CSW is working toward SNF placement.  I will sign off.    [place] Admissions Coordinator Cell [phone] Office [phone]

## 2015-12-24 NOTE — Clinical Social Work Note (Addendum)
[facility] does not have a bed available today. Will discuss other options with patient's wife.  Dayton Scrape, [place]  CSW called and left a voicemail with Joelene Millin, admissions coordinator at [facility] to see if they had made a determination on referral for patient. Also let them know that patient would likely discharge today.  Dayton Scrape, [place]  [facility] has extended a bed offer to patient. Patient and wife accepted. Will notify MD.  Dayton Scrape, [place] [phone]

## 2015-12-25 LAB — CULTURE, BLOOD (ROUTINE X 2)
CULTURE: NO GROWTH
Culture: NO GROWTH

## 2015-12-27 DIAGNOSIS — R5383 Other fatigue: Secondary | ICD-10-CM | POA: Diagnosis present

## 2015-12-27 LAB — GLUCOSE, CAPILLARY: GLUCOSE-CAPILLARY: 112 mg/dL — AB (ref 65–99)

## 2016-01-09 ENCOUNTER — Encounter: Payer: Self-pay | Admitting: *Deleted

## 2016-01-09 ENCOUNTER — Emergency Department: Payer: Medicare Other

## 2016-01-09 ENCOUNTER — Inpatient Hospital Stay
Admission: EM | Admit: 2016-01-09 | Discharge: 2016-01-10 | DRG: 178 | Disposition: A | Discharge status: Home / Self Care | Payer: Medicare Other | Attending: Internal Medicine | Admitting: Internal Medicine

## 2016-01-09 DIAGNOSIS — Z833 Family history of diabetes mellitus: Secondary | ICD-10-CM | POA: Diagnosis not present

## 2016-01-09 DIAGNOSIS — Z809 Family history of malignant neoplasm, unspecified: Secondary | ICD-10-CM

## 2016-01-09 DIAGNOSIS — N179 Acute kidney failure, unspecified: Secondary | ICD-10-CM | POA: Diagnosis present

## 2016-01-09 DIAGNOSIS — Z79899 Other long term (current) drug therapy: Secondary | ICD-10-CM

## 2016-01-09 DIAGNOSIS — J189 Pneumonia, unspecified organism: Secondary | ICD-10-CM | POA: Diagnosis present

## 2016-01-09 DIAGNOSIS — F0390 Unspecified dementia without behavioral disturbance: Secondary | ICD-10-CM | POA: Diagnosis present

## 2016-01-09 DIAGNOSIS — I129 Hypertensive chronic kidney disease with stage 1 through stage 4 chronic kidney disease, or unspecified chronic kidney disease: Secondary | ICD-10-CM | POA: Diagnosis present

## 2016-01-09 DIAGNOSIS — Z8249 Family history of ischemic heart disease and other diseases of the circulatory system: Secondary | ICD-10-CM

## 2016-01-09 DIAGNOSIS — Z7902 Long term (current) use of antithrombotics/antiplatelets: Secondary | ICD-10-CM | POA: Diagnosis not present

## 2016-01-09 DIAGNOSIS — R509 Fever, unspecified: Secondary | ICD-10-CM

## 2016-01-09 DIAGNOSIS — F17210 Nicotine dependence, cigarettes, uncomplicated: Secondary | ICD-10-CM | POA: Diagnosis present

## 2016-01-09 DIAGNOSIS — M1990 Unspecified osteoarthritis, unspecified site: Secondary | ICD-10-CM | POA: Diagnosis present

## 2016-01-09 DIAGNOSIS — Z8673 Personal history of transient ischemic attack (TIA), and cerebral infarction without residual deficits: Secondary | ICD-10-CM | POA: Diagnosis not present

## 2016-01-09 DIAGNOSIS — G40909 Epilepsy, unspecified, not intractable, without status epilepticus: Secondary | ICD-10-CM | POA: Diagnosis present

## 2016-01-09 DIAGNOSIS — J690 Pneumonitis due to inhalation of food and vomit: Secondary | ICD-10-CM | POA: Diagnosis not present

## 2016-01-09 DIAGNOSIS — K219 Gastro-esophageal reflux disease without esophagitis: Secondary | ICD-10-CM | POA: Diagnosis present

## 2016-01-09 DIAGNOSIS — R531 Weakness: Secondary | ICD-10-CM

## 2016-01-09 DIAGNOSIS — Z8546 Personal history of malignant neoplasm of prostate: Secondary | ICD-10-CM

## 2016-01-09 DIAGNOSIS — Y95 Nosocomial condition: Secondary | ICD-10-CM | POA: Diagnosis present

## 2016-01-09 DIAGNOSIS — N183 Chronic kidney disease, stage 3 (moderate): Secondary | ICD-10-CM | POA: Diagnosis present

## 2016-01-09 DIAGNOSIS — Z7951 Long term (current) use of inhaled steroids: Secondary | ICD-10-CM

## 2016-01-09 DIAGNOSIS — J159 Unspecified bacterial pneumonia: Secondary | ICD-10-CM | POA: Diagnosis present

## 2016-01-09 LAB — URINALYSIS COMPLETE WITH MICROSCOPIC (ARMC ONLY)
BACTERIA UA: NONE SEEN
Bilirubin Urine: NEGATIVE
GLUCOSE, UA: NEGATIVE mg/dL
HGB URINE DIPSTICK: NEGATIVE
Ketones, ur: NEGATIVE mg/dL
LEUKOCYTES UA: NEGATIVE
Nitrite: NEGATIVE
PH: 5 (ref 5.0–8.0)
Protein, ur: 30 mg/dL — AB
SQUAMOUS EPITHELIAL / LPF: NONE SEEN
Specific Gravity, Urine: 1.021 (ref 1.005–1.030)

## 2016-01-09 LAB — BASIC METABOLIC PANEL
Anion gap: 9 (ref 5–15)
BUN: 32 mg/dL — AB (ref 6–20)
CHLORIDE: 104 mmol/L (ref 101–111)
CO2: 22 mmol/L (ref 22–32)
CREATININE: 1.9 mg/dL — AB (ref 0.61–1.24)
Calcium: 8.8 mg/dL — ABNORMAL LOW (ref 8.9–10.3)
GFR calc Af Amer: 39 mL/min — ABNORMAL LOW (ref >60)
GFR calc non Af Amer: 33 mL/min — ABNORMAL LOW (ref >60)
GLUCOSE: 125 mg/dL — AB (ref 65–99)
POTASSIUM: 4.2 mmol/L (ref 3.5–5.1)
SODIUM: 135 mmol/L (ref 135–145)

## 2016-01-09 LAB — CBC
HEMATOCRIT: 40.5 % (ref 40.0–52.0)
Hemoglobin: 13.2 g/dL (ref 13.0–18.0)
MCH: 24.7 pg — AB (ref 26.0–34.0)
MCHC: 32.5 g/dL (ref 32.0–36.0)
MCV: 76 fL — AB (ref 80.0–100.0)
Platelets: 253 10*3/uL (ref 150–440)
RBC: 5.33 MIL/uL (ref 4.40–5.90)
RDW: 16.9 % — AB (ref 11.5–14.5)
WBC: 14.4 10*3/uL — AB (ref 3.8–10.6)

## 2016-01-09 LAB — LACTIC ACID, PLASMA
LACTIC ACID, VENOUS: 1.6 mmol/L (ref 0.5–2.0)
LACTIC ACID, VENOUS: 1.2 mmol/L (ref 0.5–2.0)

## 2016-01-09 LAB — BLOOD GAS, VENOUS
ACID-BASE DEFICIT: 2.3 mmol/L — AB (ref 0.0–2.0)
Bicarbonate: 21.6 mEq/L (ref 21.0–28.0)
FIO2: 0.21
O2 SAT: 72.8 %
PCO2 VEN: 34 mmHg — AB (ref 44.0–60.0)
PO2 VEN: 38 mmHg (ref 31.0–45.0)
Patient temperature: 37
pH, Ven: 7.41 (ref 7.320–7.430)

## 2016-01-09 LAB — TROPONIN I

## 2016-01-09 MED ORDER — HEPARIN SODIUM (PORCINE) 5000 UNIT/ML IJ SOLN
5000.0000 [IU] | Freq: Three times a day (TID) | INTRAMUSCULAR | Status: DC
  Administered 2016-01-09: 5000 [IU] via SUBCUTANEOUS
  Filled 2016-01-09 (×2): qty 1

## 2016-01-09 MED ORDER — SODIUM CHLORIDE 0.9 % IV SOLN
Freq: Once | INTRAVENOUS | Status: AC
  Administered 2016-01-09: 1000 mL via INTRAVENOUS

## 2016-01-09 MED ORDER — CLOPIDOGREL BISULFATE 75 MG PO TABS
75.0000 mg | ORAL_TABLET | Freq: Every day | ORAL | Status: DC
  Administered 2016-01-09 – 2016-01-10 (×2): 75 mg via ORAL
  Filled 2016-01-09 (×2): qty 1

## 2016-01-09 MED ORDER — PIPERACILLIN-TAZOBACTAM 3.375 G IVPB
3.3750 g | Freq: Once | INTRAVENOUS | Status: AC
  Administered 2016-01-09: 3.375 g via INTRAVENOUS
  Filled 2016-01-09: qty 50

## 2016-01-09 MED ORDER — SIMVASTATIN 40 MG PO TABS
20.0000 mg | ORAL_TABLET | Freq: Every day | ORAL | Status: DC
  Administered 2016-01-09: 20 mg via ORAL
  Filled 2016-01-09: qty 1

## 2016-01-09 MED ORDER — SENNOSIDES-DOCUSATE SODIUM 8.6-50 MG PO TABS: 1.0000 | ORAL_TABLET | Freq: Every evening | ORAL | Status: DC | PRN

## 2016-01-09 MED ORDER — FLUTICASONE PROPIONATE 50 MCG/ACT NA SUSP
2.0000 | Freq: Every day | NASAL | Status: DC
  Administered 2016-01-09: 2 via NASAL
  Filled 2016-01-09: qty 16

## 2016-01-09 MED ORDER — HYDROCODONE-ACETAMINOPHEN 5-325 MG PO TABS: 1.0000 | ORAL_TABLET | ORAL | Status: DC | PRN

## 2016-01-09 MED ORDER — ONDANSETRON HCL 4 MG PO TABS: 4.0000 mg | ORAL_TABLET | Freq: Four times a day (QID) | ORAL | Status: DC | PRN

## 2016-01-09 MED ORDER — SODIUM CHLORIDE 0.9 % IV SOLN
INTRAVENOUS | Status: DC
  Administered 2016-01-09: 75 mL/h via INTRAVENOUS

## 2016-01-09 MED ORDER — ACETAMINOPHEN 325 MG PO TABS: 650.0000 mg | ORAL_TABLET | Freq: Four times a day (QID) | ORAL | Status: DC | PRN

## 2016-01-09 MED ORDER — PIPERACILLIN-TAZOBACTAM 3.375 G IVPB
3.3750 g | Freq: Three times a day (TID) | INTRAVENOUS | Status: DC
  Administered 2016-01-09 – 2016-01-10 (×2): 3.375 g via INTRAVENOUS
  Filled 2016-01-09 (×4): qty 50

## 2016-01-09 MED ORDER — LACOSAMIDE 50 MG PO TABS
100.0000 mg | ORAL_TABLET | Freq: Two times a day (BID) | ORAL | Status: DC
  Administered 2016-01-09 – 2016-01-10 (×2): 100 mg via ORAL
  Filled 2016-01-09 (×2): qty 2

## 2016-01-09 MED ORDER — ONDANSETRON HCL 4 MG/2ML IJ SOLN: 4.0000 mg | Freq: Four times a day (QID) | INTRAMUSCULAR | Status: DC | PRN

## 2016-01-09 MED ORDER — LISINOPRIL 10 MG PO TABS: 20.0000 mg | ORAL_TABLET | Freq: Every day | ORAL | Status: DC

## 2016-01-09 MED ORDER — METOPROLOL SUCCINATE ER 50 MG PO TB24
100.0000 mg | ORAL_TABLET | Freq: Every day | ORAL | Status: DC
  Administered 2016-01-09 – 2016-01-10 (×2): 100 mg via ORAL
  Filled 2016-01-09: qty 2
  Filled 2016-01-09 (×2): qty 1

## 2016-01-09 MED ORDER — DONEPEZIL HCL 5 MG PO TABS
5.0000 mg | ORAL_TABLET | Freq: Every day | ORAL | Status: DC
  Administered 2016-01-09 – 2016-01-10 (×2): 5 mg via ORAL
  Filled 2016-01-09 (×2): qty 1

## 2016-01-09 MED ORDER — QUETIAPINE FUMARATE 25 MG PO TABS
25.0000 mg | ORAL_TABLET | Freq: Every day | ORAL | Status: DC
  Administered 2016-01-09: 25 mg via ORAL
  Filled 2016-01-09: qty 1

## 2016-01-09 MED ORDER — ACETAMINOPHEN 650 MG RE SUPP: 650.0000 mg | Freq: Four times a day (QID) | RECTAL | Status: DC | PRN

## 2016-01-09 MED ORDER — PIPERACILLIN-TAZOBACTAM 3.375 G IVPB
3.3750 g | Freq: Three times a day (TID) | INTRAVENOUS | Status: DC
  Filled 2016-01-09 (×3): qty 50

## 2016-01-09 NOTE — Progress Notes (Signed)
Pharmacy Antibiotic Note  Philip Moreno is a 75 y.o. male admitted on 01/09/2016 with pneumonia.  Pharmacy has been consulted for Zosyn dosing.  Plan: Zosyn 3.375g IV q8h (4 hour infusion).  Height: 6\' 3"  (190.5 cm) Weight: 203 lb (92.08 kg) IBW/kg (Calculated) : 84.5  Temp (24hrs), Avg:98.6 F (37 C), Min:98.6 F (37 C), Max:98.6 F (37 C)   Recent Labs Lab 01/09/16 1039 01/09/16 1133  WBC 14.4*  --   CREATININE 1.90*  --   LATICACIDVEN  --  1.6    Estimated Creatinine Clearance: 41.4 mL/min (by C-G formula based on Cr of 1.9).    No Known Allergies  Antimicrobials this admission: Zosyn 6/8 >>    Microbiology results: 6/8 BCx: pending 6/8 UCx: pending 5/20 MRSA PCR: positive  Thank you for allowing pharmacy to be a part of this patient's care.  Charly Holcomb G 01/09/2016 3:52 PM

## 2016-01-09 NOTE — ED Notes (Signed)
Per wife, patient not on any specialized diet after leaving rehab. Patient does have history of dysphagia.

## 2016-01-09 NOTE — ED Provider Notes (Signed)
[facility] Emergency Department Provider Note        Time seen: ----------------------------------------- 11:11 AM on 01/09/2016 -----------------------------------------    I have reviewed the triage vital signs and the nursing notes.   HISTORY  Chief Complaint Weakness    HPI Philip Moreno is a 75 y.o. male brought the ER by family for weakness.Family reports that beginning 4 days ago he began having weakness and trouble walking. Last night he began having fever and shaking chills. Patient was recently admitted to the hospital for similar presentation that was thought to be related to seizures but the source for the fever was not discovered. Patient was discharged from rehabilitation facility about 10 days ago and had been doing well prior to this past weekend.   Past Medical History  Diagnosis Date  . Hypertension   . GERD (gastroesophageal reflux disease)   . Arthritis   . Cancer [facility]) 05/2011    prostate cancer, [facility]  . Memory loss   . TIA (transient ischemic attack)     Patient Active Problem List   Diagnosis Date Noted  . Gout attack 12/23/2015  . History of TIA (transient ischemic attack)   . Dementia   . Tachypnea   . Pyrexia   . Disorientation   . Hypokalemia   . AKI (acute kidney injury) ([place])   . Leukocytosis   . Acute blood loss anemia   . SIRS (systemic inflammatory response syndrome) (HCC)   . CVA (cerebral infarction) 12/21/2015  . Seizure ([place]) 12/20/2015  . Embolic stroke ([place]) [id number]  . CKD (chronic kidney disease) stage 3, GFR 30-59 ml/min 12/20/2015  . Fever, unspecified 12/20/2015  . Other specified transient cerebral ischemias 05/02/2014  . Pre-syncope 04/24/2014  . 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 Arthritis 04/24/2014  . BP (high blood pressure) 04/24/2014  . Confusion state 02/28/2014  . Dizziness 02/28/2014  . Convulsions ([place]) 02/28/2014    Past Surgical History   Procedure Laterality Date  . Robot assisted laparoscopic radical prostatectomy  08/25/2012    Procedure: ROBOTIC ASSISTED LAPAROSCOPIC RADICAL PROSTATECTOMY LEVEL 2;  Surgeon: Dutch Gray, MD;  Location: [facility]S;  Service: Urology;  Laterality: N/A;  . Lymphadenectomy  08/25/2012    Procedure: LYMPHADENECTOMY;  Surgeon: Dutch Gray, MD;  Location: [facility]S;  Service: Urology;  Laterality: Bilateral;  . Prostate surgery  2012  . Colonoscopy with propofol N/A 02/27/2015    Procedure: COLONOSCOPY WITH PROPOFOL;  Surgeon: Christene Lye, MD;  Location: [facility] ENDOSCOPY;  Service: Endoscopy;  Laterality: N/A;    Allergies Review of patient's allergies indicates no known allergies.  Social History Social History  Substance Use Topics  . Smoking status: Never Smoker   . Smokeless tobacco: Current User    Types: Snuff, Chew  . Alcohol Use: No    Review of Systems Constitutional:Positive for fever and right ears Eyes: Negative for visual changes. ENT: Negative for sore throat. Cardiovascular: Negative for chest pain. Respiratory: Negative for shortness of breath. Gastrointestinal: Negative for abdominal pain, vomiting and diarrhea. Genitourinary: Negative for dysuria. Musculoskeletal: Negative for back pain. Skin: Negative for rash. Neurological: Negative for headaches, positive for generalized weakness  10-point ROS otherwise negative.  ____________________________________________   PHYSICAL EXAM:  VITAL SIGNS: ED Triage Vitals  Enc Vitals Group     BP 01/09/16 1031 109/62 mmHg     Pulse Rate 01/09/16 1031 78     Resp 01/09/16 1031 20     Temp 01/09/16 1031 98.6  F (37 C)     Temp Source 01/09/16 1031 Oral     SpO2 01/09/16 1031 96 %     Weight 01/09/16 1031 203 lb (92.08 kg)     Height 01/09/16 1031 6\' 3"  (1.905 m)     Head Cir --      [place] --      Pain Score --      Pain Loc --      Pain Edu? --      Excl. in [place]? --     Constitutional: Alert and oriented.  Well appearing and in no distress. Eyes: Conjunctivae are normal. PERRL. Normal extraocular movements. ENT   Head: Normocephalic and atraumatic.   Nose: No congestion/rhinnorhea.   Mouth/Throat: Mucous membranes are moist.   Neck: No stridor. Cardiovascular: Normal rate, regular rhythm. No murmurs, rubs, or gallops. Respiratory: Normal respiratory effort without tachypnea nor retractions. Breath sounds are clear and equal bilaterally. No wheezes/rales/rhonchi. Gastrointestinal: Soft and nontender. Normal bowel sounds Musculoskeletal: Nontender with normal range of motion in all extremities. No lower extremity tenderness nor edema. Neurologic:  Normal speech and language. No gross focal neurologic deficits are appreciated. Shuffling gait, balance disturbance. Normal strength Skin:  Skin is warm, dry and intact. No rash noted. Psychiatric: Mood and affect are normal. Speech and behavior are normal.  ____________________________________________  EKG: Interpreted by me. Normal sinus rhythm rate of 76 bpm, left axis deviation, normal intervals. No evidence of acute infarction.  ____________________________________________  ED COURSE:  Pertinent labs & imaging results that were available during my care of the patient were reviewed by me and considered in my medical decision making (see chart for details).  Patient resents to ER with return of weakness, difficulty walking as well as fever and chills. We will check basic labs and imaging. ____________________________________________    LABS (pertinent positives/negatives)  Labs Reviewed  BASIC METABOLIC PANEL - Abnormal; Notable for the following:    Glucose, Bld 125 (*)    BUN 32 (*)    Creatinine, Ser 1.90 (*)    Calcium 8.8 (*)    GFR calc non Af Amer 33 (*)    GFR calc Af Amer 39 (*)    All other components within normal limits  CBC - Abnormal; Notable for the following:    WBC 14.4 (*)    MCV 76.0 (*)    MCH 24.7 (*)     RDW 16.9 (*)    All other components within normal limits  URINALYSIS COMPLETEWITH MICROSCOPIC ([facility] ONLY) - Abnormal; Notable for the following:    Color, Urine YELLOW (*)    APPearance CLEAR (*)    Protein, ur 30 (*)    All other components within normal limits  BLOOD GAS, VENOUS - Abnormal; Notable for the following:    pCO2, Ven 34 (*)    Acid-base deficit 2.3 (*)    All other components within normal limits  CULTURE, BLOOD (ROUTINE X 2)  CULTURE, BLOOD (ROUTINE X 2)  URINE CULTURE  LACTIC ACID, PLASMA  LACTIC ACID, PLASMA  TROPONIN I  BASIC METABOLIC PANEL  CBC    RADIOLOGY Images were viewed by me  IMPRESSION: Atrophy with small vessel disease and several prior infarcts as noted above. No acute infarct is evident by CT. No hemorrhage or mass effect. Mastoid air cells bilaterally are clear.  IMPRESSION: 1. There is minimal patchy infiltrate in superior segment of the left lower lobe axial image 86. Trace right pleural effusion. There is  small atelectasis or infiltrate in right base posteriorly. No pulmonary edema. No pneumothorax. 2. No mediastinal hematoma or adenopathy. Patent central airways. 3. Degenerative changes thoracic spine. IMPRESSION: 1. Subtle ill-defined airspace opacity at the left lung base. This is favored to be mild edema and/or atelectasis. Aspiration is also a possibility given the history of recent stroke. Developing pneumonia is less likely, unless febrile, but would consider follow-up chest x-ray in 1-2 weeks to ensure resolution. 2. Otherwise stable chest x-ray. 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  ____________________________________________  FINAL ASSESSMENT AND PLAN  Fever, Possible aspiration pneumonia  Plan: Patient with labs and imaging as dictated above. Patient presented to the ER with fever, right Curtis, and weakness. Patient does not appear well enough to go home, has possible aspiration  pneumonia seen on x-ray and CT here. We have started him on IV Zosyn. Have discussed with the hospitalist for admission.   Earleen [place], MD   Note: This dictation was prepared with Dragon dictation. Any transcriptional errors that result from this process are unintentional   Earleen [place], MD 01/09/16 1950

## 2016-01-09 NOTE — H&P (Signed)
[facility] [place] NAME: Journee Lico    MR#:  [id number]  DATE OF BIRTH:  1941-02-01  DATE OF ADMISSION:  01/09/2016  PRIMARY CARE PHYSICIAN: [name], MD   REQUESTING/REFERRING PHYSICIAN: Dr. Jimmye Norman  CHIEF COMPLAINT:   Fever and weakness HISTORY OF PRESENT ILLNESS:  Jayvian Pesta  is a 75 y.o. male with a known history of Recent stroke, seizure disorder, mild dementia and essential hypertension who presents with his wife for above complaint. Wife reports over the past few days patient has had increasing generalized weakness. Last night he felt very hot and felt that he had a fever. He has a dry cough and no other symptoms. He was discharged from rehabilitation last Tuesday. He denies any urinary symptoms. In the emergency room CT scan of the chest shows probable right lower lobe infiltrate. PAST MEDICAL HISTORY:   Past Medical History  Diagnosis Date  . Hypertension   . GERD (gastroesophageal reflux disease)   . Arthritis   . Cancer [facility]) 05/2011    prostate cancer, [facility]  . Memory loss   . TIA (transient ischemic attack)     PAST SURGICAL HISTORY:   Past Surgical History  Procedure Laterality Date  . Robot assisted laparoscopic radical prostatectomy  08/25/2012    Procedure: ROBOTIC ASSISTED LAPAROSCOPIC RADICAL PROSTATECTOMY LEVEL 2;  Surgeon: Dutch Gray, MD;  [facility];  Service: Urology;  Laterality: N/A;  . Lymphadenectomy  08/25/2012    Procedure: LYMPHADENECTOMY;  Surgeon: Dutch Gray, MD;  [facility];  Service: Urology;  Laterality: Bilateral;  . Prostate surgery  2012  . Colonoscopy with propofol N/A 02/27/2015    Procedure: COLONOSCOPY WITH PROPOFOL;  Surgeon: Christene Lye, MD;  Location: [facility] ENDOSCOPY;  Service: Endoscopy;  Laterality: N/A;    SOCIAL HISTORY:   Social History  Substance Use Topics  . Smoking status: Never Smoker   . Smokeless tobacco: Current User    Types: Snuff,  Chew  . Alcohol Use: No    FAMILY HISTORY:   Family History  Problem Relation Age of Onset  . Hypertension Mother   . Heart attack Father   . Cancer Brother   . Heart failure Brother   . Diabetes Brother     DRUG ALLERGIES:  No Known Allergies  REVIEW OF SYSTEMS:   Review of Systems  Constitutional: Positive for fever and malaise/fatigue. Negative for chills.  HENT: Negative for ear discharge, ear pain, hearing loss, nosebleeds and sore throat.   Eyes: Negative for blurred vision and pain.  Respiratory: Positive for cough. Negative for hemoptysis, shortness of breath and wheezing.   Cardiovascular: Negative for chest pain, palpitations and leg swelling.  Gastrointestinal: Negative for nausea, vomiting, abdominal pain, diarrhea and blood in stool.  Genitourinary: Negative for dysuria.  Musculoskeletal: Negative for back pain.  Neurological: Positive for weakness. Negative for dizziness, tremors, speech change, focal weakness, seizures and headaches.  Endo/Heme/Allergies: Does not bruise/bleed easily.  Psychiatric/Behavioral: Positive for memory loss. Negative for depression, suicidal ideas and hallucinations.    MEDICATIONS AT HOME:   Prior to Admission medications   Medication Sig Start Date End Date Taking? Authorizing Provider  acetaminophen (TYLENOL) 325 MG tablet Take 2 tablets (650 mg total) by mouth every 6 (six) hours as needed for mild pain (or Fever >/= 101). 12/20/15  Yes Loletha Grayer, MD  clopidogrel (PLAVIX) 75 MG tablet Take 1 tablet (75 mg total) by mouth daily. 12/24/15  Yes Oswald Hillock, MD  colchicine 0.6 MG tablet Take 1 tablet (0.6 mg total) by mouth 2 (two) times daily as needed. 12/24/15  Yes Oswald Hillock, MD  donepezil (ARICEPT) 5 MG tablet Take 1 tablet (5 mg total) by mouth daily. 08/13/15  Yes Melvenia Beam, MD  fluticasone (FLONASE) 50 MCG/ACT nasal spray Place 2 sprays into both nostrils daily. 03/01/15  Yes Jan Fireman, PA-C  lacosamide 100 MG  TABS Take 1 tablet (100 mg total) by mouth 2 (two) times daily. 12/20/15  Yes Richard Leslye Peer, MD  lisinopril (PRINIVIL,ZESTRIL) 20 MG tablet Take 20 mg by mouth daily.   Yes Historical Provider, MD  metoprolol succinate (TOPROL-XL) 100 MG 24 hr tablet Take 100 mg by mouth daily. Take with or immediately following a meal.   Yes Historical Provider, MD  QUEtiapine (SEROQUEL) 25 MG tablet Take 1 tablet (25 mg total) by mouth at bedtime. 12/24/15  Yes Oswald Hillock, MD  senna-docusate (SENOKOT-S) 8.6-50 MG tablet Take 1 tablet by mouth at bedtime as needed for mild constipation. 12/24/15  Yes Oswald Hillock, MD      VITAL SIGNS:  Blood pressure 132/74, pulse 59, temperature 98.6 F (37 C), temperature source Oral, resp. rate 25, height 6\' 3"  (1.905 m), weight 92.08 kg (203 lb), SpO2 100 %.  PHYSICAL EXAMINATION:   Physical Exam  Constitutional: He is oriented to person, place, and time and well-developed, well-nourished, and in no distress. No distress.  HENT:  Head: Normocephalic.  Eyes: No scleral icterus.  Neck: Normal range of motion. Neck supple. No JVD present. No tracheal deviation present.  Cardiovascular: Normal rate, regular rhythm and normal heart sounds.  Exam reveals no gallop and no friction rub.   No murmur heard. Pulmonary/Chest: Effort normal and breath sounds normal. No respiratory distress. He has no wheezes. He has no rales. He exhibits no tenderness.  Abdominal: Soft. Bowel sounds are normal. He exhibits no distension and no mass. There is no tenderness. There is no rebound and no guarding.  Musculoskeletal: Normal range of motion. He exhibits no edema.  Neurological: He is alert and oriented to person, place, and time.  Skin: Skin is warm. No rash noted. No erythema.  Psychiatric: Affect and judgment normal.      LABORATORY PANEL:   CBC  Recent Labs Lab 01/09/16 1039  WBC 14.4*  HGB 13.2  HCT 40.5  PLT 253    ------------------------------------------------------------------------------------------------------------------  Chemistries   Recent Labs Lab 01/09/16 1039  NA 135  K 4.2  CL 104  CO2 22  GLUCOSE 125*  BUN 32*  CREATININE 1.90*  CALCIUM 8.8*   ------------------------------------------------------------------------------------------------------------------  Cardiac Enzymes  Recent Labs Lab 01/09/16 1133  TROPONINI <0.03   ------------------------------------------------------------------------------------------------------------------  RADIOLOGY:  Ct Head Wo Contrast  01/09/2016  CLINICAL DATA:  Ataxia. EXAM: CT HEAD WITHOUT CONTRAST TECHNIQUE: Contiguous axial images were obtained from the base of the skull through the vertex without intravenous contrast. COMPARISON:  Head CT and brain MRI Dec 20, 2015 FINDINGS: Mild diffuse atrophy is stable. There is no intracranial mass, hemorrhage, extra-axial fluid collection, or midline shift. Multiple small lacunar type infarcts are noted scattered in both cerebellar hemispheres, unchanged. There is evidence of a prior infarct at the gray - white junction of the mid left frontal lobe. There is a prior small infarct in the left thalamus, stable. There is patchy small vessel disease in the centra semiovale bilaterally. No acute infarct is evident by CT. Bony calvarium  appears intact. The mastoid air cells are clear. Orbits appear symmetric bilaterally. IMPRESSION: Atrophy with small vessel disease and several prior infarcts as noted above. No acute infarct is evident by CT. No hemorrhage or mass effect. Mastoid air cells bilaterally are clear. Electronically Signed   By: Lowella Grip III M.D.   On: 01/09/2016 12:46   Ct Chest Wo Contrast  01/09/2016  CLINICAL DATA:  Abnormal chest x-ray, fever EXAM: CT CHEST WITHOUT CONTRAST TECHNIQUE: Multidetector CT imaging of the chest was performed following the standard protocol without IV  contrast. COMPARISON:  Chest x-ray 01/09/2016 FINDINGS: Mediastinum/Lymph Nodes: Images of the thoracic inlet are unremarkable. No mediastinal hematoma or adenopathy. Central airways are patent. Atherosclerotic calcifications of thoracic aorta and coronary arteries. Heart size within normal limits. No pericardial effusion. There is no hilar adenopathy noted on this unenhanced scan. Lungs/Pleura: No pulmonary edema. There is no pneumothorax. There is tiny patchy infiltrate superior segment of the left lower lobe. Small atelectasis or infiltrate noted in right base posteriorly. Trace right pleural effusion. Small peripheral scarring or thickening of the fissure noted in right upper lobe laterally measures about 7 mm. Upper abdomen: The visualized unenhanced upper abdomen shows no adrenal gland mass. Visualized unenhanced liver, pancreas and spleen are unremarkable. No nephrolithiasis or hydronephrosis in visualized kidneys. Musculoskeletal: No destructive bony lesions are noted. Sagittal images of the spine shows mild degenerative changes thoracic spine. Sagittal view of the sternum is an IMPRESSION: 1. There is minimal patchy infiltrate in superior segment of the left lower lobe axial image 86. Trace right pleural effusion. There is small atelectasis or infiltrate in right base posteriorly. No pulmonary edema. No pneumothorax. 2. No mediastinal hematoma or adenopathy.  Patent central airways. 3. Degenerative changes thoracic spine. Electronically Signed   By: Lahoma Crocker M.D.   On: 01/09/2016 12:51   Dg Chest Port 1 View  01/09/2016  CLINICAL DATA:  CVA 3 weeks ago, discharge from rehab 1 week ago. Generalized weakness starting on Sunday. EXAM: PORTABLE CHEST 1 VIEW COMPARISON:  Chest x-rays dated 12/20/2015 and 08/18/2012. FINDINGS: Heart size is normal. Overall cardiomediastinal silhouette is stable in size and configuration, with stable tortuosity of the thoracic aorta. Slightly prominent pulmonary arteries  bilaterally suggesting chronic pulmonary artery hypertension. Subtle ill-defined airspace opacity at the left lung base, most likely mild edema and/or atelectasis. Lungs otherwise clear. Lung volumes are normal. Osseous soft tissue structures about the chest are unremarkable. IMPRESSION: 1. Subtle ill-defined airspace opacity at the left lung base. This is favored to be mild edema and/or atelectasis. Aspiration is also a possibility given the history of recent stroke. Developing pneumonia is less likely, unless febrile, but would consider follow-up chest x-ray in 1-2 weeks to ensure resolution. 2. Otherwise stable chest x-ray. 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 Electronically Signed   By: Franki Cabot M.D.   On: 01/09/2016 12:08    EKG:   Normal sinus rhythm heart rate 76 no ST elevation  IMPRESSION AND PLAN:   75 year old male with a history of recent stroke and seizure disorder discharged from skilled nursing facility last Tuesday who presented from home with weakness and fever.  1. HCAP versus aspiration pneumonia: Continue Zosyn which was started in the emergency room. Follow up on blood cultures. Speech consultation to evaluate for aspiration.  2. History of seizures: Continue Vimpat 3. Recent embolic CVA: Continue Plavix. Start statin.  4. Acute kidney injury: This is likely due to poor by mouth intake. Start IV  fluids and repeat BMP in a.m. I will hold lisinopril for today.  5. Essential hypertension: Continue metoprolol.  6. Dementia: Continue donepezil  All the records are reviewed and case discussed with ED provider. Management plans discussed with the patient and hen agreement Discussed care with above plan with patient's wife is well. CODE STATUS: FULL  TOTAL TIME TAKING CARE OF THIS PATIENT: 45 minutes   Tonnie Friedel M.D on 01/09/2016 at 2:34 PM  Between 7am to 6pm - Pager - [phone]  After 6pm go to [email or web address] - password [email or web address]  [facility]  Office  [phone]  CC: Primary care physician; [name], MD

## 2016-01-09 NOTE — ED Notes (Signed)
Pt had a CVA 3 weeks ago was inpatient at [facility] than transferred to [facility], pt was discharged from [facility] 1 week ago, wife reports generalized weakness started on Sunday , pt has dysphasia from stroke 3 weeks ago, pt alert and oriented with generalized weakness and stating" I don't feel well"

## 2016-01-09 NOTE — ED Notes (Signed)
In and out cath not needed. Patient able to void into urinal.

## 2016-01-09 NOTE — Progress Notes (Signed)
RN received report for patient by Philip Moreno. Patient currently A/O. BP 121/82. HR 65. Chief complaint is weakness.   Deri Fuelling, RN

## 2016-01-10 LAB — BASIC METABOLIC PANEL
Anion gap: 6 (ref 5–15)
BUN: 23 mg/dL — AB (ref 6–20)
CHLORIDE: 108 mmol/L (ref 101–111)
CO2: 24 mmol/L (ref 22–32)
CREATININE: 1.69 mg/dL — AB (ref 0.61–1.24)
Calcium: 8.6 mg/dL — ABNORMAL LOW (ref 8.9–10.3)
GFR calc Af Amer: 45 mL/min — ABNORMAL LOW (ref >60)
GFR calc non Af Amer: 38 mL/min — ABNORMAL LOW (ref >60)
Glucose, Bld: 93 mg/dL (ref 65–99)
Potassium: 4.3 mmol/L (ref 3.5–5.1)
SODIUM: 138 mmol/L (ref 135–145)

## 2016-01-10 LAB — CBC
HCT: 39.1 % — ABNORMAL LOW (ref 40.0–52.0)
Hemoglobin: 12.6 g/dL — ABNORMAL LOW (ref 13.0–18.0)
MCH: 24.8 pg — AB (ref 26.0–34.0)
MCHC: 32.3 g/dL (ref 32.0–36.0)
MCV: 76.6 fL — AB (ref 80.0–100.0)
PLATELETS: 229 10*3/uL (ref 150–440)
RBC: 5.1 MIL/uL (ref 4.40–5.90)
RDW: 16.7 % — AB (ref 11.5–14.5)
WBC: 9.9 10*3/uL (ref 3.8–10.6)

## 2016-01-10 LAB — URINE CULTURE: CULTURE: NO GROWTH

## 2016-01-10 MED ORDER — BOOST / RESOURCE BREEZE PO LIQD
1.0000 | Freq: Three times a day (TID) | ORAL | Status: DC
  Administered 2016-01-10: 1 via ORAL

## 2016-01-10 MED ORDER — AMOXICILLIN-POT CLAVULANATE 875-125 MG PO TABS: 1.0000 | ORAL_TABLET | Freq: Two times a day (BID) | ORAL | Status: DC

## 2016-01-10 NOTE — Evaluation (Signed)
Clinical/Bedside Swallow Evaluation Patient Details  Name: Philip Moreno MRN: [id number] Date of Birth: 22-Jul-1941  Today's Date: 01/10/2016 Time: SLP Start Time (ACUTE ONLY): 0840 SLP Stop Time (ACUTE ONLY): 0940 SLP Time Calculation (min) (ACUTE ONLY): 60 min  Past Medical History:  Past Medical History  Diagnosis Date  . Hypertension   . GERD (gastroesophageal reflux disease)   . Arthritis   . Cancer [facility]) 05/2011    prostate cancer, [facility]  . Memory loss   . TIA (transient ischemic attack)    Past Surgical History:  Past Surgical History  Procedure Laterality Date  . Robot assisted laparoscopic radical prostatectomy  08/25/2012    Procedure: ROBOTIC ASSISTED LAPAROSCOPIC RADICAL PROSTATECTOMY LEVEL 2;  Surgeon: Dutch Gray, MD;  Location: [facility] OR[facility];  Service: Urology;  Laterality: N/A;  . Lymphadenectomy  08/25/2012    Procedure: LYMPHADENECTOMY;  Surgeon: Dutch Gray, MD;  Location: [facility];  Service: Urology;  Laterality: Bilateral;  . Prostate surgery  2012  . Colonoscopy with propofol N/A 02/27/2015    Procedure: COLONOSCOPY WITH PROPOFOL;  Surgeon: Christene Lye, MD;  Location: [facility] ENDOSCOPY;  Service: Endoscopy;  Laterality: N/A;   HPI:  Philip Moreno is a 75 y.o. male with medical history significant of HTN, dementia, prior TIAs and cerebellar strokes, seizures, and GERD. Patient presented to the ED at [facility] He has a dry cough and no other symptoms. He was discharged from rehabilitation last Tuesday. He denies any urinary symptoms. During his last admission, pt was transferred to [facility] d/t seizure activity issues. Wife stated he was seen by ST services for more "memory issues" but then discharged after a session. Pt is currently awake, verbally responsive wanting to eat breakfast. Wife reported pt's appetite or desire for oral intake wanes at times - suspect d/t Cognitive changes.    Assessment / Plan / Recommendation Clinical Impression  Pt appeared to  adequately tolerate trials of thin liquids and soft solid foods w/ no overt s/s of aspiration noted; no overt coughing or throat clearing noted and clear vocal quality observed b/t trials. No oral phaes deficits noted w/ the consistencies assessed. Recommended softer, cooked foods sec. to Cognitive status and edentulous status. Pt fed self w/ tray/food setup. Pt appears able to tolerate a pureed diet w/ thin liquids following general aspiration precautions w/ reduced risk for aspiration. Recommend continuing current regular diet consistency as ordered - wife stated she would cut the meats for pt. Recommend general aspiration precautions including using applesauce to swallow pills when necessary d/t Cognitive decline. No further skilled ST services indicated for swallowing; wife agreed. NSG updated.     Aspiration Risk   (reduced)    Diet Recommendation  regular/mech soft, thin liquids; general aspiration precautions and monitoring at meals as needed; reduce distractions at meals  Medication Administration: Whole meds with puree (when needed for eaiser swallowing)    Other  Recommendations Recommended Consults:  (dietician) Oral Care Recommendations: Oral care BID;Staff/trained caregiver to provide oral care   Follow up Recommendations  None    Frequency and Duration            Prognosis Prognosis for Safe Diet Advancement: Good Barriers to Reach Goals: Cognitive deficits      Swallow Study   General Date of Onset: 01/09/16 HPI: Philip Moreno is a 75 y.o. male with medical history significant of HTN, dementia, prior TIAs and cerebellar strokes, seizures, and GERD. Patient presented to the ED at [facility] He has  a dry cough and no other symptoms. He was discharged from rehabilitation last Tuesday. He denies any urinary symptoms. During his last admission, pt was transferred to [facility] d/t seizure activity issues. Wife stated he was seen by ST services for more "memory issues" but then  discharged after a session. Pt is currently awake, verbally responsive wanting to eat breakfast. Wife reported pt's appetite or desire for oral intake wanes at times - suspect d/t Cognitive changes.  Type of Study: Bedside Swallow Evaluation Previous Swallow Assessment: previous admission Diet Prior to this Study: Regular;Thin liquids Temperature Spikes Noted: No (wbc 9.9) Respiratory Status: Room air History of Recent Intubation: No Behavior/Cognition: Alert;Cooperative;Pleasant mood;Distractible;Requires cueing;Confused Oral Cavity Assessment: Within Functional Limits Oral Care Completed by SLP: Recent completion by staff Oral Cavity - Dentition: Edentulous (does not wear dentures) Vision: Functional for self-feeding Self-Feeding Abilities: Able to feed self;Needs set up Patient Positioning: Upright in bed Baseline Vocal Quality: Normal;Low vocal intensity Volitional Cough: Strong Volitional Swallow: Able to elicit    Oral/Motor/Sensory Function Overall Oral Motor/Sensory Function: Within functional limits   Ice Chips Ice chips: Not tested   Thin Liquid Thin Liquid: Within functional limits Presentation: Cup;Self Fed (6+ozs total)    Nectar Thick Nectar Thick Liquid: Not tested   Honey Thick Honey Thick Liquid: Not tested   Puree Puree: Not tested   Solid   GO   Solid: Within functional limits (soft foods) Presentation: Self Fed;Spoon Other Comments: entire breakfast meal       Orinda Kenner, MS, CCC-SLP  Velecia Ovitt 01/10/2016,1:46 PM

## 2016-01-10 NOTE — Plan of Care (Signed)
Problem: Safety: Goal: Ability to remain free from injury will improve Outcome: Progressing No injury reported or observed   

## 2016-01-10 NOTE — Progress Notes (Signed)
Pt refusing blood works as of this time per phlebotomist. Pt is alert this time, educated about the significance of the blood tests. Pt said he will have it done later in the morning. Plebotomist made aware.

## 2016-01-10 NOTE — Care Management Note (Signed)
Case Management Note  Patient Details  Name: Philip Moreno MRN: [id number] Date of Birth: 04/29/1941  Subjective/Objective:                  Met with patient and his wife [name] to discuss discharge planning. Patient is a readmission with pneumonia not requiring O2 at this time. [name] did most of the talking. He is not doing any kind of PT now. He went to [facility] after he discharged from [facility] in May but she states he did not received home health. He has a rolling walker. [name] states that "he doesn't need anything". His seizure meds are expensive but "they are managing".   Action/Plan: List of home health agencies delivered prior to PT evaluation. They are recommending outpatient PT if patient/wife will agree. I shared [email or web address] so that she can search for a cheaper pharmacy; I also advised her to check with [facility] as they are sometimes competitive with prices. She was appreciative. No RNCM needs. Case closed.   Expected Discharge Date:                  Expected Discharge Plan:     In-House Referral:     Discharge planning Services  CM Consult  Post Acute Care Choice:    Choice offered to:  Patient, Spouse  DME Arranged:    DME Agency:     HH Arranged:    [place] Agency:     Status of Service:  Completed, signed off  Medicare Important Message Given:  Yes Date Medicare IM Given:    Medicare IM give by:    Date Additional Medicare IM Given:    Additional Medicare Important Message give by:     If discussed at [place] of Stay Meetings, dates discussed:    Additional Comments:  Marshell Garfinkel, RN 01/10/2016, 12:17 PM

## 2016-01-10 NOTE — Discharge Summary (Signed)
[facility] [place] NAME: Philip Moreno    MR#:  [id number]  DATE OF BIRTH:  [birth year]  DATE OF ADMISSION:  01/09/2016 ADMITTING PHYSICIAN: Bettey Costa, MD  DATE OF DISCHARGE: 01/10/2016  1:44 PM  PRIMARY CARE PHYSICIAN: [name], MD    ADMISSION DIAGNOSIS:  Weakness [R53.1] Fever [R50.9] Aspiration pneumonia of right lower lobe, unspecified aspiration pneumonia type ([place]) [J69.0]   DISCHARGE DIAGNOSIS:   HCAP versus aspiration pneumonia SECONDARY DIAGNOSIS:   Past Medical History  Diagnosis Date  . Hypertension   . GERD (gastroesophageal reflux disease)   . Arthritis   . Cancer [facility]) 05/2011    prostate cancer, [facility]  . Memory loss   . TIA (transient ischemic attack)     HOSPITAL COURSE:   75 year old male with a history of recent stroke and seizure disorder discharged from skilled nursing facility last Tuesday who presented from home with weakness and fever.  1. HCAP versus aspiration pneumonia:  He was treated with Zosyn, leukocytosis improved, changed to by mouth Augmentin.  2. History of seizures: Continue Vimpat 3. Recent embolic CVA: Continue Plavix.   4. 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 Improving with IV fluids.  5. Essential hypertension: Continue metoprolol.  6. Dementia: Continue donepezil  DISCHARGE CONDITIONS:   Stable, discharged to home today.  CONSULTS OBTAINED:     DRUG ALLERGIES:  No Known Allergies  DISCHARGE MEDICATIONS:   Discharge Medication List as of 01/10/2016  1:35 PM    START taking these medications   Details  amoxicillin-clavulanate (AUGMENTIN) 875-125 MG tablet Take 1 tablet by mouth 2 (two) times daily., Starting 01/10/2016, Until Discontinued, Print      CONTINUE these medications which have NOT CHANGED   Details  acetaminophen (TYLENOL) 325 MG tablet Take 2 tablets (650 mg total) by mouth every 6 (six) hours  as needed for mild pain (or Fever >/= 101)., Starting 12/20/2015, Until Discontinued, No Print    clopidogrel (PLAVIX) 75 MG tablet Take 1 tablet (75 mg total) by mouth daily., Starting 12/24/2015, Until Discontinued, Normal    colchicine 0.6 MG tablet Take 1 tablet (0.6 mg total) by mouth 2 (two) times daily as needed., Starting 12/24/2015, Until Discontinued, No Print    donepezil (ARICEPT) 5 MG tablet Take 1 tablet (5 mg total) by mouth daily., Starting 08/13/2015, Until Discontinued, Normal    fluticasone (FLONASE) 50 MCG/ACT nasal spray Place 2 sprays into both nostrils daily., Starting 03/01/2015, Until Discontinued, Normal    lacosamide 100 MG TABS Take 1 tablet (100 mg total) by mouth 2 (two) times daily., Starting 12/20/2015, Until Discontinued, No Print    lisinopril (PRINIVIL,ZESTRIL) 20 MG tablet Take 20 mg by mouth daily., Until Discontinued, Historical Med    metoprolol succinate (TOPROL-XL) 100 MG 24 hr tablet Take 100 mg by mouth daily. Take with or immediately following a meal., Until Discontinued, Historical Med    QUEtiapine (SEROQUEL) 25 MG tablet Take 1 tablet (25 mg total) by mouth at bedtime., Starting 12/24/2015, Until Discontinued, Print    senna-docusate (SENOKOT-S) 8.6-50 MG tablet Take 1 tablet by mouth at bedtime as needed for mild constipation., Starting 12/24/2015, Until Discontinued, No Print         DISCHARGE INSTRUCTIONS:    If you experience worsening of your admission symptoms, develop shortness of breath, life threatening emergency, suicidal or homicidal thoughts you must seek medical attention  immediately by calling 911 or calling your MD immediately  if symptoms less severe.  You Must read complete instructions/literature along with all the possible adverse reactions/side effects for all the Medicines you take and that have been prescribed to you. Take any new Medicines after you have completely understood and accept all the possible adverse reactions/side  effects.   Please note  You were cared for by a hospitalist during your hospital stay. If you have any questions about your discharge medications or the care you received while you were in the hospital after you are discharged, you can call the unit and asked to speak with the hospitalist on call if the hospitalist that took care of you is not available. Once you are discharged, your primary care physician will handle any further medical issues. Please note that NO REFILLS for any discharge medications will be authorized once you are discharged, as it is imperative that you return to your primary care physician (or establish a relationship with a primary care physician if you do not have one) for your aftercare needs so that they can reassess your need for medications and monitor your lab values.    Today   SUBJECTIVE   No complaint.   VITAL SIGNS:  Blood pressure 137/78, pulse 66, temperature 98.5 F (36.9 C), temperature source Oral, resp. rate 18, height 6\' 3"  (1.905 m), weight 203 lb (92.08 kg), SpO2 96 %.  I/O:   Intake/Output Summary (Last 24 hours) at 01/10/16 1735 Last data filed at 01/10/16 0649  Gross per 24 hour  Intake 1017.5 ml  Output    500 ml  Net  517.5 ml    PHYSICAL EXAMINATION:  GENERAL:  75 y.o.-year-old patient lying in the bed with no acute distress.  EYES: Pupils equal, round, reactive to light and accommodation. No scleral icterus. Extraocular muscles intact.  HEENT: Head atraumatic, normocephalic. Oropharynx and nasopharynx clear.  NECK:  Supple, no jugular venous distention. No thyroid enlargement, no tenderness.  LUNGS: Normal breath sounds bilaterally, no wheezing, rales,rhonchi or crepitation. No use of accessory muscles of respiration.  CARDIOVASCULAR: S1, S2 normal. No murmurs, rubs, or gallops.  ABDOMEN: Soft, non-tender, non-distended. Bowel sounds present. No organomegaly or mass.  EXTREMITIES: No pedal edema, cyanosis, or clubbing.   NEUROLOGIC: Cranial nerves II through XII are intact. Muscle strength 5/5 in all extremities. Sensation intact. Gait not checked.  PSYCHIATRIC: The patient is alert and oriented x 3.  SKIN: No obvious rash, lesion, or ulcer.   DATA REVIEW:   CBC  Recent Labs Lab 01/10/16 1138  WBC 9.9  HGB 12.6*  HCT 39.1*  PLT 229    Chemistries   Recent Labs Lab 01/10/16 1138  NA 138  K 4.3  CL 108  CO2 24  GLUCOSE 93  BUN 23*  CREATININE 1.69*  CALCIUM 8.6*    Cardiac Enzymes  Recent Labs Lab 01/09/16 1133  TROPONINI <0.03    Microbiology Results  Results for orders placed or performed during the hospital encounter of 01/09/16  Blood Culture (routine x 2)     Status: None (Preliminary result)   Collection Time: 01/09/16 11:33 AM  Result Value Ref Range Status   Specimen Description BLOOD RIGHT FATTY CASTS  Final   Special Requests   Final    BOTTLES DRAWN AEROBIC AND ANAEROBIC AERO 1CC ANA 3CC   Culture NO GROWTH < 24 HOURS  Final   Report Status PENDING  Incomplete  Blood Culture (routine x 2)  Status: None (Preliminary result)   Collection Time: 01/09/16 11:33 AM  Result Value Ref Range Status   Specimen Description BLOOD RIGHT FATTY CASTS  Final   Special Requests   Final    BOTTLES DRAWN AEROBIC AND ANAEROBIC  AERO 5CC ANA [place]   Culture NO GROWTH < 24 HOURS  Final   Report Status PENDING  Incomplete  Urine culture     Status: None   Collection Time: 01/09/16  1:35 PM  Result Value Ref Range Status   Specimen Description URINE, RANDOM  Final   Special Requests NONE  Final   Culture NO GROWTH Performed at [facility]   Final   Report Status 01/10/2016 FINAL  Final    RADIOLOGY:  Ct Head Wo Contrast  01/09/2016  CLINICAL DATA:  Ataxia. EXAM: CT HEAD WITHOUT CONTRAST TECHNIQUE: Contiguous axial images were obtained from the base of the skull through the vertex without intravenous contrast. COMPARISON:  Head CT and brain MRI Dec 20, 2015 FINDINGS:  Mild diffuse atrophy is stable. There is no intracranial mass, hemorrhage, extra-axial fluid collection, or midline shift. Multiple small lacunar type infarcts are noted scattered in both cerebellar hemispheres, unchanged. There is evidence of a prior infarct at the gray - white junction of the mid left frontal lobe. There is a prior small infarct in the left thalamus, stable. There is patchy small vessel disease in the centra semiovale bilaterally. No acute infarct is evident by CT. Bony calvarium appears intact. The mastoid air cells are clear. Orbits appear symmetric bilaterally. IMPRESSION: Atrophy with small vessel disease and several prior infarcts as noted above. No acute infarct is evident by CT. No hemorrhage or mass effect. Mastoid air cells bilaterally are clear. Electronically Signed   By: Lowella Grip III M.D.   On: 01/09/2016 12:46   Ct Chest Wo Contrast  01/09/2016  CLINICAL DATA:  Abnormal chest x-ray, fever EXAM: CT CHEST WITHOUT CONTRAST TECHNIQUE: Multidetector CT imaging of the chest was performed following the standard protocol without IV contrast. COMPARISON:  Chest x-ray 01/09/2016 FINDINGS: Mediastinum/Lymph Nodes: Images of the thoracic inlet are unremarkable. No mediastinal hematoma or adenopathy. Central airways are patent. Atherosclerotic calcifications of thoracic aorta and coronary arteries. Heart size within normal limits. No pericardial effusion. There is no hilar adenopathy noted on this unenhanced scan. Lungs/Pleura: No pulmonary edema. There is no pneumothorax. There is tiny patchy infiltrate superior segment of the left lower lobe. Small atelectasis or infiltrate noted in right base posteriorly. Trace right pleural effusion. Small peripheral scarring or thickening of the fissure noted in right upper lobe laterally measures about 7 mm. Upper abdomen: The visualized unenhanced upper abdomen shows no adrenal gland mass. Visualized unenhanced liver, pancreas and spleen are  unremarkable. No nephrolithiasis or hydronephrosis in visualized kidneys. Musculoskeletal: No destructive bony lesions are noted. Sagittal images of the spine shows mild degenerative changes thoracic spine. Sagittal view of the sternum is an IMPRESSION: 1. There is minimal patchy infiltrate in superior segment of the left lower lobe axial image 86. Trace right pleural effusion. There is small atelectasis or infiltrate in right base posteriorly. No pulmonary edema. No pneumothorax. 2. No mediastinal hematoma or adenopathy.  Patent central airways. 3. Degenerative changes thoracic spine. Electronically Signed   By: Lahoma Crocker M.D.   On: 01/09/2016 12:51   Dg Chest Port 1 View  01/09/2016  CLINICAL DATA:  CVA 3 weeks ago, discharge from rehab 1 week ago. Generalized weakness starting on Sunday. EXAM: PORTABLE CHEST  1 VIEW COMPARISON:  Chest x-rays dated 12/20/2015 and 08/18/2012. FINDINGS: Heart size is normal. Overall cardiomediastinal silhouette is stable in size and configuration, with stable tortuosity of the thoracic aorta. Slightly prominent pulmonary arteries bilaterally suggesting chronic pulmonary artery hypertension. Subtle ill-defined airspace opacity at the left lung base, most likely mild edema and/or atelectasis. Lungs otherwise clear. Lung volumes are normal. Osseous soft tissue structures about the chest are unremarkable. IMPRESSION: 1. Subtle ill-defined airspace opacity at the left lung base. This is favored to be mild edema and/or atelectasis. Aspiration is also a possibility given the history of recent stroke. Developing pneumonia is less likely, unless febrile, but would consider follow-up chest x-ray in 1-2 weeks to ensure resolution. 2. Otherwise stable chest x-ray. 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 Electronically Signed   By: Franki Cabot M.D.   On: 01/09/2016 12:08        Management plans discussed with the patient, his wife and they are in  agreement.  CODE STATUS:  Code Status History    Date Active Date Inactive Code Status Order ID Comments User Context   01/09/2016  2:27 PM 01/10/2016  4:44 PM Full Code [id number]  Bettey Costa, MD ED   12/20/2015  8:24 PM 12/24/2015  8:48 PM Full Code [id number]  Etta Quill, DO Inpatient   12/20/2015  6:32 AM 12/20/2015  8:24 PM Full Code [id number]  Harrie Foreman, MD Inpatient      TOTAL TIME TAKING CARE OF THIS PATIENT: 37 minutes.    Demetrios Loll M.D on 01/10/2016 at 5:35 PM  Between 7am to 6pm - Pager - [phone]  After 6pm go to [email or web address] - password [email or web address] [facility]  Office  [phone]  CC: Primary care physician; [name], MD

## 2016-01-10 NOTE — Progress Notes (Signed)
Pt discharged home. DC instructions provided and explained. Medications reviewed. Rx given. All questions answered. Pt stable at discharge.

## 2016-01-10 NOTE — Care Management Important Message (Signed)
Important Message  Patient Details  Name: MARKESE ILSLEY MRN: [id number] Date of Birth: April 14, 1941   Medicare Important Message Given:  Yes    Shelbie Ammons, RN 01/10/2016, 8:45 AM

## 2016-01-10 NOTE — Progress Notes (Signed)
Initial Nutrition Assessment  DOCUMENTATION CODES:   Non-severe (moderate) malnutrition in context of chronic illness  INTERVENTION:   Recommend liberalizing diet order to Regular to optimize nutritional intake. Pt reports not having his teeth but not wanting to downgrade to dysphagia III if able. SLP following agreeable to continuing Regular diet order.  Wife at bedside ordering and aiding pt with food selections.  Recommend Boost Breeze po BID, each supplement provides 250 kcal and 9 grams of protein and will send Magic Cup on lunch and dinner trays.   NUTRITION DIAGNOSIS:   Malnutrition related to chronic illness as evidenced by energy intake < or equal to 75% for > or equal to 1 month, mild depletion of body fat, moderate depletions of muscle mass.  GOAL:   Patient will meet greater than or equal to 90% of their needs  MONITOR:   PO intake, Supplement acceptance, Labs, Weight trends, I & O's, Skin  REASON FOR ASSESSMENT:   Malnutrition Screening Tool    ASSESSMENT:   Pt admitted with healthcare acquired pna versus aspiration pna per MD note.  Past Medical History  Diagnosis Date  . Hypertension   . GERD (gastroesophageal reflux disease)   . Arthritis   . Cancer [facility]) 05/2011    prostate cancer, [facility]  . Memory loss   . TIA (transient ischemic attack)     Diet Order:  Diet regular Room service appropriate?: Yes; Fluid consistency:: Thin   Pt reports eating well this am 90% of meal tray.  SLP reports observing pt eating [place] sausage well.  Pt's wife reports poor appetite for months. Pt's wife reports since dementia has been diagnosed, pt has had fluctuating po intake greatly, reporting some meals he will eat small amounts and other meals he will skip all together. Pt reports his appetite has been down and doesn't seem to like foods he used to. Pt's wife reports pt tried one Boost and one Ensure   Medications: NS at 49mL/hr Labs: BUN 23, Cre  1.69   Gastrointestinal Profile: Last BM:  01/08/2016   Nutrition-Focused Physical Exam Findings: Nutrition-Focused physical exam completed. Findings are mild fat depletion, moderate muscle depletion, and no edema.     Weight Change: Pt's wife reports pt weight was 220lbs to her knowledge. Per chart pt weight of 203lbs currently and 207lbs last admission at [facility] one month ago (1.9% weight loss in one month) 7% weight loss since the first of the year per [facility] weight trends.   Skin:  Reviewed, no issues   Height:   Ht Readings from Last 1 Encounters:  01/09/16 6\' 3"  (1.905 m)    Weight:   Wt Readings from Last 1 Encounters:  01/09/16 203 lb (92.08 kg)   Wt Readings from Last 10 Encounters:  01/09/16 203 lb (92.08 kg)  12/20/15 207 lb 3.7 oz (94 kg)  12/20/15 207 lb (93.895 kg)  10/17/15 223 lb (101.152 kg)  08/13/15 218 lb 3.2 oz (98.975 kg)  07/12/15 210 lb (95.255 kg)  02/14/15 212 lb (96.163 kg)  05/14/14 209 lb 12.8 oz (95.165 kg)  05/08/14 209 lb (94.802 kg)  04/23/14 207 lb (93.895 kg)    BMI:  Body mass index is 25.37 kg/(m^2).  Estimated Nutritional Needs:   Kcal:  2300-2706kcals  Protein:  100-120g protein  Fluid:  >/= 2.2L fluid  EDUCATION NEEDS:   Education needs no appropriate at this time  Dwyane Luo, RD, LDN Pager [phone] Weekend/On-Call Pager [phone]

## 2016-01-10 NOTE — Progress Notes (Signed)
Physical Therapy Evaluation Patient Details Name: Philip Moreno MRN: [id number] DOB: 1941/07/20 Today's Date: 01/10/2016   History of Present Illness  Pt is a 75 yr old male presenting with general weakness x 1wk and fever. He was admitted for pneumonia. 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 He was d/c'ed to SNF and subsequently d/c'ed home last week indep with functional mobility. Additional PMH is significant for seizure disorder, mild dementia, HTN, GERD, arthritis, prostate cancer, and AKI.    Clinical Impression  Prior to current admission, pt was independent with all functional mobility.  Pt lives in a 1-level home with his wife and daughter with 2 steps to enter. His daughter and/or wife are available to provide 24/7 supervision and physical assist PRN.  Currently pt is supervision for bed mobility, CGA versus min A for sit <> stand transfers with RW, and able to ambulate 117ft with CGA and RW. While pt has [facility] strength, he remains limited by impaired balance, decreased safety awareness, and impulsivity. Pt would benefit from skilled PT to address noted impairments and functional limitations. Recommend pt discharge home with 24/7 supervision and outpatient PT when medically appropriate.     Follow Up Recommendations Outpatient PT    Equipment Recommendations  Other (comment) (has RW at home)       Precautions / Restrictions Precautions Precautions: Fall Restrictions Weight Bearing Restrictions: No      Mobility  Bed Mobility Overal bed mobility: Needs Assistance Bed Mobility: Supine to Sit     Supine to sit: Supervision;HOB elevated     General bed mobility comments: Pt able to quickly assume sitting EOB upon request. Requires supervision and vc's for safety, d/t pt's impulsivity.  Transfers Overall transfer level: Needs assistance Equipment used: Rolling walker  (2 wheeled) Transfers: Sit to/from Stand Sit to Stand: +2 safety/equipment;Min assist    General transfer comment: Pt requires min A and RW for sit > stand d/t posterior lean and unsteadiness. VC's required for hand placement and sequencing, as well as to get feet underneath him upon assuming stand. 2nd person assist for line management and additional safety. Pt required only CGA and VC'ing for stand > sit.  Ambulation/Gait Ambulation/Gait assistance: Min guard;+2 safety/equipment  Ambulation Distance (Feet): 180 Feet Assistive device: Rolling walker (2 wheeled) Gait Pattern/deviations: Step-through pattern;Decreased step length - right     General Gait Details: Initial steps were short/shuffling, though resolved with repetition. R step length decreased as compared to L. VC's to look forward and maintain forward progression.   Stairs Not attempted this session, plan to attempt at next session d/t 2 steps required for home entry.     Balance Overall balance assessment: Needs assistance Sitting-balance support: Bilateral upper extremity supported;Feet supported Sitting balance-Leahy Scale: Fair     Standing balance support: Bilateral upper extremity supported (on RW) Standing balance-Leahy Scale: Fair     Comments: Will attempt to perform formal balance assessment at next session.     Pertinent Vitals/Pain Pain Assessment: No/denies pain Pain Score: 0-No pain    Home Living Family/patient expects to be discharged to:: Private residence Living Arrangements: Spouse/significant other;Children Available Help at Discharge: Family;Available 24 hours/day Type of Home: House Home Access: Stairs to enter Entrance Stairs-Rails: None Entrance Stairs-Number of Steps: 2 Home Layout: One level Home Equipment: Walker - 2 wheels      Prior Function Level of Independence: Independent    Comments: both  prior to admission for CVA and following d/c from rehab        Extremity/Trunk  Assessment   Upper Extremity Assessment: Overall WFL for tasks assessed       Lower Extremity Assessment: Overall WFL for tasks assessed;RLE deficits/detail;LLE deficits/detail      Cervical / Trunk Assessment: Normal  Communication   Communication: No difficulties  Cognition Arousal/Alertness: Awake/alert Behavior During Therapy: Flat affect;Impulsive Overall Cognitive Status: Within Functional Limits for tasks assessed     General Comments      Exercises General Exercises - Lower Extremity Ankle Circles/Pumps: AROM;Strengthening;Both;10 reps;Supine Quad Sets: AROM;Strengthening;Both;10 reps;Supine Gluteal Sets: AROM;Strengthening;Both;10 reps;Supine (tactile cueing for appropriate contraction) Short Arc Quad: AROM;Strengthening;Both;10 reps;Supine Heel Slides: AROM;Strengthening;Both;10 reps;Supine Hip ABduction/ADduction: AROM;Strengthening;Both;10 reps;Supine      Assessment/Plan    PT Assessment Patient needs continued PT services  PT Diagnosis  (Decreased balance, decreased safety awareness)   PT Problem List Decreased balance;Decreased knowledge of use of DME;Decreased safety awareness  PT Treatment Interventions DME instruction;Gait training;Stair training;Functional mobility training;Therapeutic activities;Therapeutic exercise;Balance training;Patient/family education   PT Goals (Current goals can be found in the Care Plan section) Acute Rehab PT Goals Patient Stated Goal: To return home PT Goal Formulation: With patient Time For Goal Achievement: 01/24/16 Potential to Achieve Goals: Good    Frequency Min 2X/week   Barriers to discharge        End of Session Equipment Utilized During Treatment: Gait belt Activity Tolerance: Patient tolerated treatment well Patient left: in chair;with call bell/phone within reach;with chair alarm set;with family/visitor present Nurse Communication: Mobility status;Precautions        Time: [id number] PT Time  Calculation (min) (ACUTE ONLY): 33 min   Charges:         PT G Codes:        Kessler Kopinski, SPT 01/10/2016, 10:31 AM

## 2016-01-10 NOTE — Discharge Instructions (Signed)
Heart healthy diet. Activity as tolerated. Outpatient PT.

## 2016-01-14 LAB — CULTURE, BLOOD (ROUTINE X 2)
CULTURE: NO GROWTH
Culture: NO GROWTH

## 2016-02-25 ENCOUNTER — Encounter: Payer: Self-pay | Admitting: Neurology

## 2016-02-25 ENCOUNTER — Ambulatory Visit (INDEPENDENT_AMBULATORY_CARE_PROVIDER_SITE_OTHER): Payer: Medicare Other | Admitting: Neurology

## 2016-02-25 VITALS — BP 105/70 | HR 61 | Ht 75.0 in | Wt 197.2 lb

## 2016-02-25 DIAGNOSIS — I638 Other cerebral infarction: Secondary | ICD-10-CM

## 2016-02-25 DIAGNOSIS — I4891 Unspecified atrial fibrillation: Secondary | ICD-10-CM | POA: Diagnosis not present

## 2016-02-25 DIAGNOSIS — R569 Unspecified convulsions: Secondary | ICD-10-CM

## 2016-02-25 DIAGNOSIS — I6389 Other cerebral infarction: Secondary | ICD-10-CM

## 2016-02-25 DIAGNOSIS — F0391 Unspecified dementia with behavioral disturbance: Secondary | ICD-10-CM

## 2016-02-25 DIAGNOSIS — F03918 Unspecified dementia, unspecified severity, with other behavioral disturbance: Secondary | ICD-10-CM

## 2016-02-25 MED ORDER — DONEPEZIL HCL 10 MG PO TABS: 10.0000 mg | ORAL_TABLET | Freq: Every day | ORAL | 12 refills | Status: DC

## 2016-02-25 MED ORDER — MEMANTINE HCL 5 MG PO TABS: 5.0000 mg | ORAL_TABLET | Freq: Two times a day (BID) | ORAL | 11 refills | Status: DC

## 2016-02-25 NOTE — Patient Instructions (Addendum)
Overall you are doing fairly well but I do want to suggest a few things today:   Remember to drink plenty of fluid, eat healthy meals and do not skip any meals. Try to eat protein with a every meal and eat a healthy snack such as fruit or nuts in between meals. Try to keep a regular sleep-wake schedule and try to exercise daily, particularly in the form of walking, 20-30 minutes a day, if you can.   As far as your medications are concerned, I would like to suggest: Continue Keppra and other medications Increase Donepezil to 10mg  daily. In 2 weeks add Namenda (memantine) 5mg  twice daily  As far as diagnostic testing: 30-day heart monitor  I would like to see you back in 6 month, sooner if we need to. Please call us with any interim questions, concerns, problems, updates or refill requests.   Our phone number is [phone]. We also have an after hours call service for urgent matters and there is a physician on-call for urgent questions. For any emergencies you know to call 911 or go to the nearest emergency room  Memantine Tablets What is this medicine? MEMANTINE (MEM an teen) is used to treat dementia caused by Alzheimer's disease. This medicine may be used for other purposes; ask your health care provider or pharmacist if you have questions. What should I tell my health care provider before I take this medicine? They need to know if you have any of these conditions: -difficulty passing urine -kidney disease -liver disease -seizures -an unusual or allergic reaction to memantine, other medicines, foods, dyes, or preservatives -pregnant or trying to get pregnant -breast-feeding How should I use this medicine? Take this medicine by mouth with a glass of water. Follow the directions on the prescription label. You may take this medicine with or without food. Take your doses at regular intervals. Do not take your medicine more often than directed. Continue to take your medicine even if you  feel better. Do not stop taking except on the advice of your doctor or health care professional. Talk to your pediatrician regarding the use of this medicine in children. Special care may be needed. Overdosage: If you think you have taken too much of this medicine contact a poison control center or emergency room at once. NOTE: This medicine is only for you. Do not share this medicine with others. What if I miss a dose? If you miss a dose, take it as soon as you can. If it is almost time for your next dose, take only that dose. Do not take double or extra doses. If you do not take your medicine for several days, contact your health care provider. Your dose may need to be changed. What may interact with this medicine? -acetazolamide -amantadine -cimetidine -dextromethorphan -dofetilide -hydrochlorothiazide -ketamine -metformin -methazolamide -quinidine -ranitidine -sodium bicarbonate -triamterene This list may not describe all possible interactions. Give your health care provider a list of all the medicines, herbs, non-prescription drugs, or dietary supplements you use. Also tell them if you smoke, drink alcohol, or use illegal drugs. Some items may interact with your medicine. What should I watch for while using this medicine? Visit your doctor or health care professional for regular checks on your progress. Check with your doctor or health care professional if there is no improvement in your symptoms or if they get worse. You may get drowsy or dizzy. Do not drive, use machinery, or do anything that needs mental alertness until you know how  this drug affects you. Do not stand or sit up quickly, especially if you are an older patient. This reduces the risk of dizzy or fainting spells. Alcohol can make you more drowsy and dizzy. Avoid alcoholic drinks. What side effects may I notice from receiving this medicine? Side effects that you should report to your doctor or health care professional as  soon as possible: -allergic reactions like skin rash, itching or hives, swelling of the face, lips, or tongue -agitation or a feeling of restlessness -depressed mood -dizziness -hallucinations -redness, blistering, peeling or loosening of the skin, including inside the mouth -seizures -vomiting Side effects that usually do not require medical attention (report to your doctor or health care professional if they continue or are bothersome): -constipation -diarrhea -headache -nausea -trouble sleeping This list may not describe all possible side effects. Call your doctor for medical advice about side effects. You may report side effects to FDA at 1-800-FDA-1088. Where should I keep my medicine? Keep out of the reach of children. Store at room temperature between 15 degrees and 30 degrees C (59 degrees and 86 degrees F). Throw away any unused medicine after the expiration date. NOTE: This sheet is a summary. It may not cover all possible information. If you have questions about this medicine, talk to your doctor, pharmacist, or health care provider.    2016, [employer]. (2013-05-08 14:10:42)   Donepezil tablets What is this medicine? DONEPEZIL (doe NEP e zil) is used to treat mild to moderate dementia caused by Alzheimer's disease. This medicine may be used for other purposes; ask your health care provider or pharmacist if you have questions. What should I tell my health care provider before I take this medicine? They need to know if you have any of these conditions: -asthma or other lung disease -difficulty passing urine -head injury -heart disease -history of irregular heartbeat -liver disease -seizures (convulsions) -stomach or intestinal disease, ulcers or stomach bleeding -an unusual or allergic reaction to donepezil, other medicines, foods, dyes, or preservatives -pregnant or trying to get pregnant -breast-feeding How should I use this medicine? Take this  medicine by mouth with a glass of water. Follow the directions on the prescription label. You may take this medicine with or without food. Take this medicine at regular intervals. This medicine is usually taken before bedtime. Do not take it more often than directed. Continue to take your medicine even if you feel better. Do not stop taking except on your doctor's advice. If you are taking the 23 mg donepezil tablet, swallow it whole; do not cut, crush, or chew it. Talk to your pediatrician regarding the use of this medicine in children. Special care may be needed. Overdosage: If you think you have taken too much of this medicine contact a poison control center or emergency room at once. NOTE: This medicine is only for you. Do not share this medicine with others. What if I miss a dose? If you miss a dose, take it as soon as you can. If it is almost time for your next dose, take only that dose, do not take double or extra doses. What may interact with this medicine? Do not take this medicine with any of the following medications: -certain medicines for fungal infections like itraconazole, fluconazole, posaconazole, and voriconazole -cisapride -dextromethorphan; quinidine -dofetilide -dronedarone -pimozide -quinidine -thioridazine -ziprasidone This medicine may also interact with the following medications: -antihistamines for allergy, cough and cold -atropine -bethanechol -carbamazepine -certain medicines for bladder problems like oxybutynin, tolterodine -certain  medicines for Parkinson's disease like benztropine, trihexyphenidyl -certain medicines for stomach problems like dicyclomine, hyoscyamine -certain medicines for travel sickness like scopolamine -dexamethasone -ipratropium -NSAIDs, medicines for pain and inflammation, like ibuprofen or naproxen -other medicines for Alzheimer's disease -other medicines that prolong the QT interval (cause an abnormal heart  rhythm) -phenobarbital -phenytoin -rifampin, rifabutin or rifapentine This list may not describe all possible interactions. Give your health care provider a list of all the medicines, herbs, non-prescription drugs, or dietary supplements you use. Also tell them if you smoke, drink alcohol, or use illegal drugs. Some items may interact with your medicine. What should I watch for while using this medicine? Visit your doctor or health care professional for regular checks on your progress. Check with your doctor or health care professional if your symptoms do not get better or if they get worse. You may get drowsy or dizzy. Do not drive, use machinery, or do anything that needs mental alertness until you know how this drug affects you. What side effects may I notice from receiving this medicine? Side effects that you should report to your doctor or health care professional as soon as possible: -allergic reactions like skin rash, itching or hives, swelling of the face, lips, or tongue -changes in vision -feeling faint or lightheaded, falls -problems with balance -redness, blistering, peeling or loosening of the skin, including inside the mouth -slow heartbeat, or palpitations -stomach pain -unusual bleeding or bruising, red or purple spots on the skin -vomiting -weight loss Side effects that usually do not require medical attention (report to your doctor or health care professional if they continue or are bothersome): -diarrhea, especially when starting treatment -headache -indigestion or heartburn -loss of appetite -muscle cramps -nausea This list may not describe all possible side effects. Call your doctor for medical advice about side effects. You may report side effects to FDA at 1-800-FDA-1088. Where should I keep my medicine? Keep out of reach of children. Store at room temperature between 15 and 30 degrees C (59 and 86 degrees F). Throw away any unused medicine after the expiration  date. NOTE: This sheet is a summary. It may not cover all possible information. If you have questions about this medicine, talk to your doctor, pharmacist, or health care provider.    2016, [employer] Standard. (2014-03-01 07:51:52)

## 2016-02-25 NOTE — Progress Notes (Signed)
[facility]    Provider:  [name] Referring Provider: [name], MD Primary Care Physician:  [name], MD  CC:  Stroke and seizure  HPI:  Philip Moreno is a 75 y.o. male here as a referral from Dr. Brynda Greathouse for follow up after the hospital. He is here with his wife who provides most information. PMHx of dementia, stroke, acute kidney injury, hypertension, chronic kidney disease, seizures. Patient was admitted to the hospital in May after a convulsion. He had a convulsion at home and 911 was called and then in the emergency room had another episode, shaking all over, generalized tonic clonic or light. He was started on vimpat. That was too expensive and is now on Keppra. He has since not had any episodes since starting the Keppra. He is on Keppra, wife doesn't know the dosage. No side effects. He is on Plavix for stroke prevention. His memory is poor. Wife doesn;t know if he is on 5mg  or 10mg  of Aricept, doesn't know his medications. He continues to have progressive memory loss. 2 weeks ago he was trying to get out of bed to go to the restroom and he just stood there and the next thing he fell back into her arms in the bathroom and couldn't get up. He was responsive but says he couldn't move, he was aware but having difficulty walking. He has balance issues, he is walking slowly which is chronic. Patient says says he feels funny in his head. He can't explain it. No side effects to the keppra, not more irritable,  he does have some behavior issues over the last several years with the dementia. He gets loud. Denies depression. Denies head pain. He was on Namenda but she stopped it, she would like to restart it.   Reviewed notes, labs and imaging from outside physicians, which showed:  LDL 48, A1c 6.4, BUN 23, creatinine 1.6, TSH 10.6  Ct Head Wo Contrast 12/20/2015   No acute intracranial abnormalities. Chronic atrophy and small vessel ischemic changes.   Philip Moreno Contrast 12/20/2015   1. Three small acute/early subacute infarcts in the high frontal lobes and right occipital lobe, emboli versus watershed ischemia.  2. Progressive, mild-to-moderate chronic small vessel ischemic disease including new small chronic cortical infarcts in the high frontal lobes.  3. Remote left occipital and bilateral cerebellar infarcts.   Dg Chest Port 1 View 12/20/2015   Linear atelectasis in the lung bases.   EEG - pending  Mra Head Wo Contrast 12/21/2015  IMPRESSION: 1. The study is severely degraded by patient motion through the cavernous internal carotid arteries and basilar artery. No comments can be made on these areas. 2. The A1 and M1 segments and proximal posterior cerebral arteries are intact. 3. Moderate attenuation of MCA and distal PCA branch vessels may be exaggerated by patient motion.   CUS - Bilateral: 1-39% ICA stenosis. Vertebral artery flow is antegrade.  TTE - - Procedure narrative: Transthoracic echocardiography. Image  quality was adequate. The study was technically difficult. - Left ventricle: The cavity size was normal. There was mild  concentric hypertrophy. Systolic function was normal. The  estimated ejection fraction was in the range of 55% to 60%.  Doppler parameters are consistent with abnormal left ventricular  relaxation (grade 1 diastolic dysfunction). - Aortic valve: There was trivial regurgitation. Impressions: - No cardiac source of emboli was indentified.  Review of Systems: Patient complains of symptoms per HPI as well as  the following symptoms: Activity change, appetite change, cold intolerance, walking difficulty, memory loss by headache, seizure, speech difficulty, weakness, snoring. Pertinent negatives per HPI. All others negative.   Social History   Social History  . Marital status: Married    Spouse name: Jamelle Haring   . Number of children: 0  . Years of education: 7   Occupational History  . farmer      Social History Main Topics  . Smoking status: Never Smoker  . Smokeless tobacco: Current User    Types: Snuff, Chew  . Alcohol use No  . Drug use: No  . Sexual activity: Not on file   Other Topics Concern  . Not on file   Social History Narrative   Patient lives at home.    Patient is a part time farmer    Patient has a 7th grade education.    Patient has no children.    Caffeine use:     Family History  Problem Relation Age of Onset  . Hypertension Mother   . Heart attack Father   . Cancer Brother   . Heart failure Brother   . Diabetes Brother     Past Medical History:  Diagnosis Date  . Arthritis   . Cancer [facility]) 05/2011   prostate cancer, [facility]  . GERD (gastroesophageal reflux disease)   . Hypertension   . Memory loss   . TIA (transient ischemic attack)     Past Surgical History:  Procedure Laterality Date  . COLONOSCOPY WITH PROPOFOL N/A 02/27/2015   Procedure: COLONOSCOPY WITH PROPOFOL;  Surgeon: Christene Lye, MD;  Location: [facility] ENDOSCOPY;  Service: Endoscopy;  Laterality: N/A;  . LYMPHADENECTOMY  08/25/2012   Procedure: LYMPHADENECTOMY;  Surgeon: Dutch Gray, MD;  Location: [facility];  Service: Urology;  Laterality: Bilateral;  . PROSTATE SURGERY  2012  . ROBOT ASSISTED LAPAROSCOPIC RADICAL PROSTATECTOMY  08/25/2012   Procedure: ROBOTIC ASSISTED LAPAROSCOPIC RADICAL PROSTATECTOMY LEVEL 2;  Surgeon: Dutch Gray, MD;  Location: [facility];  Service: Urology;  Laterality: N/A;    Current Outpatient Prescriptions  Medication Sig Dispense Refill  . clopidogrel (PLAVIX) 75 MG tablet Take 1 tablet (75 mg total) by mouth daily. 30 tablet 2  . donepezil (ARICEPT) 5 MG tablet Take 1 tablet (5 mg total) by mouth daily. 30 tablet 11  . lisinopril (PRINIVIL,ZESTRIL) 20 MG tablet Take 20 mg by mouth daily.    . metoprolol succinate (TOPROL-XL) 100 MG 24 hr tablet Take 100 mg by mouth daily. Take with or immediately following a meal.    . QUEtiapine (SEROQUEL)  25 MG tablet Take 1 tablet (25 mg total) by mouth at bedtime. 30 tablet 2   No current facility-administered medications for this visit.     Allergies as of 02/25/2016  . (No Known Allergies)    Vitals: BP 105/70 (BP Location: Right Arm, Patient Position: Sitting, Cuff Size: Normal)   Pulse 61   Ht 6\' 3"  (1.905 m)   Wt 197 lb 3.2 oz (89.4 kg)   BMI 24.65 kg/m  Last Weight:  Wt Readings from Last 1 Encounters:  02/25/16 197 lb 3.2 oz (89.4 kg)   Last Height:   Ht Readings from Last 1 Encounters:  02/25/16 6\' 3"  (1.905 m)   Physical exam: Exam: Gen: NAD Eyes: Conjunctivae clear without exudates or hemorrhage  Neuro: Detailed Neurologic Exam  Speech:    Speech is normal; fluent and spontaneous with normal comprehension.  Cognition:    The patient is  oriented to person only    recent and remote memory impaired;     language fluent;     Impaired attention, concentration,     fund of knowledge impaired Cranial Nerves:    The pupils are equal, round, and reactive to light.  Visual fields are full to finger confrontation. Extraocular movements are intact. Trigeminal sensation is intact and the muscles of mastication are normal. The face is symmetric. The palate elevates in the midline. Hearing intact. Voice is normal. Shoulder shrug is normal. The tongue has normal motion without fasciculations.   Coordination:    Normal finger to nose and heel to shin. Normal rapid alternating movements.   Gait:    Mildly stooped but good brisk gait, good turns, not ataxic.    Motor Observation:    No asymmetry, no atrophy, and no involuntary movements noted. Tone:    Normal muscle tone.       Strength:    Strength is V/V in the upper and lower limbs.      Sensation: intact to LT     Reflex Exam:  DTR's:    Deep tendon reflexes in the upper and lower extremities are normal bilaterally.      Assessment/Plan:  Philip Moreno is a 75 y.o. male with history of hypertension,  dementia, prostate cancer, previous TIA presenting with new onset seizures and strokes.  He did not receive IV t-PA due to unknown time of onset.  Seizure  Continue Keppra  MRI with and without contrast did not show tumor or structural lesion to explain seizure  May be related to dementia  Stroke:  Bilateral MCA/ACA, and right MCA/PCA watershed infarcts, embolic vs hypoperfusion/hypoxia. This can be related to hypoxia during seizure or cardioembolic. We recommend BP control and 30 day cardiac event monitoring.  Three small acute/early subacute infarcts in the high frontal lobes and right occipital lobe, watershed distribution. Old cerebellar punctate infarcts bilaterally with questionable old left MCA/PCA infarct.   MRA - motion degraded with no proximal LVO  Carotid Doppler unremarkable  2D Echo unremarkable EF 55-60%  Recommend 30 day cardiac event monitoring as outpt to rule out afib: Ordered today  LDL - 48  HgbA1c 6.4  Continue plavix for stroke prevention.   Patient counseled to be compliant with his antithrombotic medications  Ongoing aggressive stroke risk factor management   Alzheimer's dementia - increase aricept and re-start namenda  Sarina Ill, MD  [facility] [address] [place], [place] [address]  Phone [phone] Fax [phone]  A total of 30 minutes was spent face-to-face with this patient. Over half this time was spent on counseling patient on the seizure, stroke, dementia diagnosis and different diagnostic and therapeutic options available.

## 2016-02-27 MED ORDER — LEVETIRACETAM 500 MG PO TABS: 1000.0000 mg | ORAL_TABLET | Freq: Two times a day (BID) | ORAL | 12 refills | Status: DC

## 2016-03-04 ENCOUNTER — Ambulatory Visit (INDEPENDENT_AMBULATORY_CARE_PROVIDER_SITE_OTHER): Payer: Medicare Other

## 2016-03-04 ENCOUNTER — Other Ambulatory Visit: Payer: Self-pay | Admitting: Neurology

## 2016-03-04 DIAGNOSIS — I639 Cerebral infarction, unspecified: Secondary | ICD-10-CM

## 2016-03-04 DIAGNOSIS — I4891 Unspecified atrial fibrillation: Secondary | ICD-10-CM

## 2016-03-20 ENCOUNTER — Other Ambulatory Visit (INDEPENDENT_AMBULATORY_CARE_PROVIDER_SITE_OTHER): Payer: Self-pay

## 2016-03-20 DIAGNOSIS — Z0289 Encounter for other administrative examinations: Secondary | ICD-10-CM

## 2016-04-15 ENCOUNTER — Telehealth: Payer: Self-pay | Admitting: *Deleted

## 2016-04-15 NOTE — Telephone Encounter (Signed)
Tried home number listed. Phone rang several times and went to automated message asking for remote access code. Unable to LVM  Tried mobile number. VM not set up, unable to LVM.

## 2016-04-15 NOTE — Telephone Encounter (Signed)
-----   Message from Melvenia Beam, MD sent at 04/14/2016  4:23 PM EDT ----- No abnormalities except his pulse is very low. On average it is 56. This may be why he feels dizzy or funny in the head all the time. They need to follow up with their primary care and we can fax the attached report to them. thanks

## 2016-04-17 NOTE — Telephone Encounter (Signed)
Called and spoke to patient about results per [name] note. Pt verbalized understanding. I verified PCP Dr Brynda Greathouse. Advised I will forward report to him.   Faxed cardiac event monitor report to [phone] (Dr Brynda Greathouse). Received confirmation.

## 2016-04-20 NOTE — Telephone Encounter (Signed)
Called and spoke to [place] and relayed results again per [name] note. She verbalized understanding and will contact his PCP to further evaluate. She has no further questions at this time.

## 2016-04-20 NOTE — Telephone Encounter (Signed)
Pts wife called asking for a call back from the nurse. States the pt does not remember what was told to him . Please call [phone]

## 2016-08-19 ENCOUNTER — Other Ambulatory Visit: Payer: Self-pay | Admitting: Neurology

## 2016-08-20 ENCOUNTER — Other Ambulatory Visit: Payer: Self-pay | Admitting: Neurology

## 2016-08-27 ENCOUNTER — Ambulatory Visit: Payer: Medicare Other | Admitting: Adult Health

## 2016-10-09 ENCOUNTER — Emergency Department: Payer: Medicare Other

## 2016-10-09 ENCOUNTER — Emergency Department
Admission: EM | Admit: 2016-10-09 | Discharge: 2016-10-09 | Disposition: A | Discharge status: Home / Self Care | Payer: Medicare Other | Attending: Emergency Medicine | Admitting: Emergency Medicine

## 2016-10-09 ENCOUNTER — Encounter: Payer: Self-pay | Admitting: Emergency Medicine

## 2016-10-09 DIAGNOSIS — Z79899 Other long term (current) drug therapy: Secondary | ICD-10-CM | POA: Insufficient documentation

## 2016-10-09 DIAGNOSIS — F17220 Nicotine dependence, chewing tobacco, uncomplicated: Secondary | ICD-10-CM | POA: Diagnosis not present

## 2016-10-09 DIAGNOSIS — R531 Weakness: Secondary | ICD-10-CM | POA: Diagnosis present

## 2016-10-09 DIAGNOSIS — N183 Chronic kidney disease, stage 3 (moderate): Secondary | ICD-10-CM | POA: Diagnosis not present

## 2016-10-09 DIAGNOSIS — J111 Influenza due to unidentified influenza virus with other respiratory manifestations: Secondary | ICD-10-CM

## 2016-10-09 DIAGNOSIS — F17290 Nicotine dependence, other tobacco product, uncomplicated: Secondary | ICD-10-CM | POA: Diagnosis not present

## 2016-10-09 DIAGNOSIS — Z8546 Personal history of malignant neoplasm of prostate: Secondary | ICD-10-CM | POA: Insufficient documentation

## 2016-10-09 DIAGNOSIS — I129 Hypertensive chronic kidney disease with stage 1 through stage 4 chronic kidney disease, or unspecified chronic kidney disease: Secondary | ICD-10-CM | POA: Diagnosis not present

## 2016-10-09 LAB — CBC
HEMATOCRIT: 46 % (ref 40.0–52.0)
Hemoglobin: 15.1 g/dL (ref 13.0–18.0)
MCH: 25.8 pg — ABNORMAL LOW (ref 26.0–34.0)
MCHC: 32.8 g/dL (ref 32.0–36.0)
MCV: 78.8 fL — AB (ref 80.0–100.0)
Platelets: 182 10*3/uL (ref 150–440)
RBC: 5.84 MIL/uL (ref 4.40–5.90)
RDW: 15.4 % — AB (ref 11.5–14.5)
WBC: 10.7 10*3/uL — ABNORMAL HIGH (ref 3.8–10.6)

## 2016-10-09 LAB — URINALYSIS, COMPLETE (UACMP) WITH MICROSCOPIC
BACTERIA UA: NONE SEEN
Bilirubin Urine: NEGATIVE
Glucose, UA: NEGATIVE mg/dL
Ketones, ur: NEGATIVE mg/dL
LEUKOCYTES UA: NEGATIVE
Nitrite: NEGATIVE
PROTEIN: 30 mg/dL — AB
Specific Gravity, Urine: 1.028 (ref 1.005–1.030)
pH: 5 (ref 5.0–8.0)

## 2016-10-09 LAB — BASIC METABOLIC PANEL
Anion gap: 7 (ref 5–15)
BUN: 25 mg/dL — AB (ref 6–20)
CHLORIDE: 110 mmol/L (ref 101–111)
CO2: 25 mmol/L (ref 22–32)
Calcium: 8.9 mg/dL (ref 8.9–10.3)
Creatinine, Ser: 1.56 mg/dL — ABNORMAL HIGH (ref 0.61–1.24)
GFR calc Af Amer: 49 mL/min — ABNORMAL LOW (ref >60)
GFR calc non Af Amer: 42 mL/min — ABNORMAL LOW (ref >60)
GLUCOSE: 106 mg/dL — AB (ref 65–99)
POTASSIUM: 3.7 mmol/L (ref 3.5–5.1)
Sodium: 142 mmol/L (ref 135–145)

## 2016-10-09 LAB — INFLUENZA PANEL BY PCR (TYPE A & B)
INFLAPCR: POSITIVE — AB
Influenza B By PCR: NEGATIVE

## 2016-10-09 LAB — LACTIC ACID, PLASMA
LACTIC ACID, VENOUS: 2 mmol/L — AB (ref 0.5–1.9)
Lactic Acid, Venous: 2.1 mmol/L (ref 0.5–1.9)

## 2016-10-09 MED ORDER — SODIUM CHLORIDE 0.9 % IV BOLUS (SEPSIS)
1000.0000 mL | Freq: Once | INTRAVENOUS | Status: AC
  Administered 2016-10-09: 1000 mL via INTRAVENOUS

## 2016-10-09 MED ORDER — ACETAMINOPHEN 325 MG PO TABS
650.0000 mg | ORAL_TABLET | Freq: Once | ORAL | Status: AC
  Administered 2016-10-09: 650 mg via ORAL
  Filled 2016-10-09: qty 2

## 2016-10-09 MED ORDER — OSELTAMIVIR PHOSPHATE 75 MG PO CAPS: 75.0000 mg | ORAL_CAPSULE | Freq: Two times a day (BID) | ORAL | 0 refills | Status: AC

## 2016-10-09 MED ORDER — OSELTAMIVIR PHOSPHATE 75 MG PO CAPS
75.0000 mg | ORAL_CAPSULE | Freq: Once | ORAL | Status: AC
  Administered 2016-10-09: 75 mg via ORAL
  Filled 2016-10-09: qty 1

## 2016-10-09 NOTE — ED Provider Notes (Signed)
[facility] Emergency Department Provider Note  ____________________________________________   First MD Initiated Contact with Patient 10/09/16 1321     (approximate)  I have reviewed the triage vital signs and the nursing notes.   HISTORY  Chief Complaint Weakness   HPI Philip Moreno is a 76 y.o. male with a history of past pneumonia who is presenting to the emergency department with 2 days of cough, weakness and fever home. The patient denies any pain. Does not note any sputum production from a cough.   Past Medical History:  Diagnosis Date  . Arthritis   . Cancer [facility]) 05/2011   prostate cancer, [facility]  . GERD (gastroesophageal reflux disease)   . Hypertension   . Memory loss   . TIA (transient ischemic attack)     Patient Active Problem List   Diagnosis Date Noted  . Pneumonia 01/09/2016  . Gout attack 12/23/2015  . History of TIA (transient ischemic attack)   . Dementia   . Tachypnea   . Pyrexia   . Disorientation   . Hypokalemia   . AKI (acute kidney injury) ([place])   . Leukocytosis   . Acute blood loss anemia   . SIRS (systemic inflammatory response syndrome) ([facility])   . CVA (cerebral infarction) 12/21/2015  . Seizure ([place]) 12/20/2015  . Embolic stroke ([place]) 41/74/0814  . CKD (chronic kidney disease) stage 3, GFR 30-59 ml/min 12/20/2015  . Fever, unspecified 12/20/2015  . Other specified transient cerebral ischemias 05/02/2014  . Pre-syncope 04/24/2014  . 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 Arthritis 04/24/2014  . BP (high blood pressure) 04/24/2014  . Confusion state 02/28/2014  . Dizziness 02/28/2014  . Convulsions ([place]) 02/28/2014    Past Surgical History:  Procedure Laterality Date  . COLONOSCOPY WITH PROPOFOL N/A 02/27/2015   Procedure: COLONOSCOPY WITH PROPOFOL;  Surgeon: Christene Lye, MD;  Location: [facility] END[facility]COPY;  Service: Endoscopy;  Laterality: N/A;  . LYMPHADENECTOMY  08/25/2012     Procedure: LYMPHADENECTOMY;  Surgeon: Dutch Gray, MD;  Location[facility];  Service: Urology;  Laterality: Bilateral;  . PROSTATE SURGERY  2012  . ROBOT ASSISTED LAPAROSCOPIC RADICAL PROSTATECTOMY  08/25/2012   Procedure: ROBOTIC ASSISTED LAPAROSCOPIC RADICAL PROSTATECTOMY LEVEL 2;  Surgeon: Dutch Gray, MD;  Location: [facility];  Service: Urology;  Laterality: N/A;    Prior to Admission medications   Medication Sig Start Date End Date Taking? Authorizing Provider  clopidogrel (PLAVIX) 75 MG tablet Take 1 tablet (75 mg total) by mouth daily. 12/24/15   Oswald Hillock, MD  donepezil (ARICEPT) 10 MG tablet Take 1 tablet (10 mg total) by mouth at bedtime. 02/25/16   Melvenia Beam, MD  levETIRAcetam (KEPPRA) 500 MG tablet Take 2 tablets (1,000 mg total) by mouth 2 (two) times daily. 02/27/16   Melvenia Beam, MD  lisinopril (PRINIVIL,ZESTRIL) 20 MG tablet Take 20 mg by mouth daily.    Historical Provider, MD  memantine (NAMENDA) 5 MG tablet Take 1 tablet (5 mg total) by mouth 2 (two) times daily. 02/25/16   Melvenia Beam, MD  metoprolol succinate (TOPROL-XL) 100 MG 24 hr tablet Take 100 mg by mouth daily. Take with or immediately following a meal.    Historical Provider, MD  QUEtiapine (SEROQUEL) 25 MG tablet Take 1 tablet (25 mg total) by mouth at bedtime. 12/24/15   Oswald Hillock, MD    Allergies Patient has no known allergies.  Family History  Problem Relation Age of Onset  .  Hypertension Mother   . Heart attack Father   . Cancer Brother   . Heart failure Brother   . Diabetes Brother     Social History Social History  Substance Use Topics  . Smoking status: Never Smoker  . Smokeless tobacco: Current User    Types: Snuff, Chew  . Alcohol use No    Review of Systems Constitutional: fever/chills Eyes: No visual changes. ENT: No sore throat. Cardiovascular: Denies chest pain. Respiratory: as above Gastrointestinal: No abdominal pain.  No nausea, no vomiting.  No diarrhea.  No  constipation. Genitourinary: Negative for dysuria. Musculoskeletal: Negative for back pain. Skin: Negative for rash. Neurological: Negative for headaches, focal weakness or numbness.  10-point ROS otherwise negative.  ____________________________________________   PHYSICAL EXAM:  VITAL SIGNS: ED Triage Vitals  Enc Vitals Group     BP 10/09/16 1056 (!) 156/100     Pulse Rate 10/09/16 1056 73     Resp 10/09/16 1056 18     Temp 10/09/16 1056 100.1 F (37.8 C)     Temp Source 10/09/16 1056 Oral     SpO2 10/09/16 1056 94 %     Weight 10/09/16 1038 197 lb (89.4 kg)     Height --      Head Circumference --      Peak Flow --      Pain Score 10/09/16 1038 0     Pain Loc --      Pain Edu? --      Excl. in [place]? --     Constitutional: Alert and oriented.  in no acute distress. Eyes: Conjunctivae are normal. PERRL. EOMI. Head: Atraumatic. Nose: No congestion/rhinnorhea. Mouth/Throat: Mucous membranes are moist.  Oropharynx non-erythematous. Neck: No stridor.   Cardiovascular: Normal rate, regular rhythm. Grossly normal heart sounds.  Good peripheral circulation. Respiratory: Normal respiratory effort.  No retractions.  Mild rales to the bilateral bases. Gastrointestinal: Soft and nontender. No distention.  Musculoskeletal: No lower extremity tenderness nor edema.  No joint effusions. Neurologic:  Normal speech and language. No gross focal neurologic deficits are appreciated.  Skin:  Skin is warm, dry and intact. No rash noted. Psychiatric: Mood and affect are normal. Speech and behavior are normal.  ____________________________________________   LABS (all labs ordered are listed, but only abnormal results are displayed)  Labs Reviewed  BASIC METABOLIC PANEL - Abnormal; Notable for the following:       Result Value   Glucose, Bld 106 (*)    BUN 25 (*)    Creatinine, Ser 1.56 (*)    GFR calc non Af Amer 42 (*)    GFR calc Af Amer 49 (*)    All other components within  normal limits  CBC - Abnormal; Notable for the following:    WBC 10.7 (*)    MCV 78.8 (*)    MCH 25.8 (*)    RDW 15.4 (*)    All other components within normal limits  URINALYSIS, COMPLETE (UACMP) WITH MICROSCOPIC - Abnormal; Notable for the following:    Color, Urine YELLOW (*)    APPearance HAZY (*)    Hgb urine dipstick SMALL (*)    Protein, ur 30 (*)    Squamous Epithelial / LPF 0-5 (*)    All other components within normal limits  INFLUENZA PANEL BY PCR (TYPE A & B) - Abnormal; Notable for the following:    Influenza A By PCR POSITIVE (*)    All other components within normal limits  LACTIC ACID,  PLASMA  LACTIC ACID, PLASMA  CBG MONITORING, ED   ____________________________________________  EKG  ED ECG REPORT I, Stewart Pimenta,  Youlanda Roys, the attending physician, personally viewed and interpreted this ECG.   Date: 10/09/2016  EKG Time: 1047  Rate: 77  Rhythm: normal sinus rhythm  Axis: left  Intervals:none  ST&T Change: No ST segment elevation or depression. No abnormal T-wave inversion. No significant change from previous on the record.  ____________________________________________  RADIOLOGY  DG Chest 2 View (Final result)  Result time 10/09/16 14:04:07  Final result by Gaspar Cola, MD (10/09/16 14:04:07)           Narrative:   CLINICAL DATA: 76 year old male with cough low-grade fever inch ill for 1 week. Initial encounter.  EXAM: CHEST 2 VIEW  COMPARISON: Chest CT 01/09/2016 and earlier.  FINDINGS: Seated AP and lateral views of the chest. Lower lung volumes today with crowding of lung markings. Allowing for this mediastinal contours appear stable since 2014. Mild tortuosity of the thoracic aorta. No pneumothorax, pulmonary edema, pleural effusion or confluent pulmonary opacity. No acute osseous abnormality identified. Negative visible bowel gas pattern.  IMPRESSION: Low lung volumes, otherwise no acute cardiopulmonary  abnormality.   Electronically Signed By: Genevie Ann M.D. On: 10/09/2016 14:04            ____________________________________________   PROCEDURES  Procedure(s) performed:   Procedures  Critical Care performed:   ____________________________________________   INITIAL IMPRESSION / ASSESSMENT AND PLAN / ED COURSE  Pertinent labs & imaging results that were available during my care of the patient were reviewed by me and considered in my medical decision making (see chart for details).  ----------------------------------------- 3:17 PM on 10/09/2016 -----------------------------------------  Patient tested positive for flu. Triage note says that the patient has had ongoing weakness for months with a tremor. However, I asked about recent increase in the symptoms as far as the cough and fever and the patient's wife says 1-2 days. The patient's tremor is minimal noticeable on my exam. We did discuss the patient following up with primary care as well as neurology. Patient will be treated for flu with Tamiflu. Will be discharged home. Follow-up with primary care for further evaluation for the ongoing weakness and tremor. Patient able to ambulate at this time at the bedside without assistance with a normal gait. Patient family where the diagnosis and treatment plan. They're understanding and willing to comply.      ____________________________________________   FINAL CLINICAL IMPRESSION(S) / ED DIAGNOSES   Influenza.   NEW MEDICATIONS STARTED DURING THIS VISIT:  New Prescriptions   No medications on file     Note:  This document was prepared using Dragon voice recognition software and may include unintentional dictation errors.    Orbie Pyo, MD 10/09/16 [phone]

## 2016-10-09 NOTE — ED Notes (Signed)
Assisted him up to the BR  NAD assessed

## 2016-10-09 NOTE — ED Notes (Signed)
Pt ate crackers and he has been drinking water  Po challenge with satisfactory results

## 2016-10-09 NOTE — ED Provider Notes (Signed)
 -----------------------------------------   6:18 PM on 10/09/2016 -----------------------------------------  Vital signs stable. Repeat lactate 2.0, blood patient had not finished second fluid bolus. Continue to monitor. He is ambulatory and not in distress and well-appearing.  We'll engage and shared decision making with the patient and his wife regarding their comfort and confidence in being able to manage this at home over the weekend.  ----------------------------------------- 6:40 PM on 10/09/2016 ----------------------------------------- Patient energetic, ambulatory and steady. Eager to go home. Return precautions given, follow-up with primary care.    Carrie Mew, MD 10/09/16 1840

## 2016-10-09 NOTE — ED Triage Notes (Signed)
Pt to ed from [facility] with wife who reports increasing weakness over the last several months, today unable to stand or walk.

## 2016-10-17 ENCOUNTER — Other Ambulatory Visit: Payer: Self-pay | Admitting: Neurology

## 2017-01-22 ENCOUNTER — Emergency Department: Payer: Medicare Other

## 2017-01-22 ENCOUNTER — Encounter: Payer: Self-pay | Admitting: Emergency Medicine

## 2017-01-22 ENCOUNTER — Emergency Department
Admission: EM | Admit: 2017-01-22 | Discharge: 2017-01-22 | Disposition: A | Discharge status: Home / Self Care | Payer: Medicare Other | Attending: Emergency Medicine | Admitting: Emergency Medicine

## 2017-01-22 DIAGNOSIS — Z7902 Long term (current) use of antithrombotics/antiplatelets: Secondary | ICD-10-CM | POA: Diagnosis not present

## 2017-01-22 DIAGNOSIS — R630 Anorexia: Secondary | ICD-10-CM | POA: Diagnosis present

## 2017-01-22 DIAGNOSIS — N183 Chronic kidney disease, stage 3 (moderate): Secondary | ICD-10-CM | POA: Diagnosis not present

## 2017-01-22 DIAGNOSIS — Z79899 Other long term (current) drug therapy: Secondary | ICD-10-CM | POA: Diagnosis not present

## 2017-01-22 DIAGNOSIS — Z720 Tobacco use: Secondary | ICD-10-CM | POA: Insufficient documentation

## 2017-01-22 DIAGNOSIS — R531 Weakness: Secondary | ICD-10-CM | POA: Diagnosis not present

## 2017-01-22 DIAGNOSIS — R627 Adult failure to thrive: Secondary | ICD-10-CM | POA: Insufficient documentation

## 2017-01-22 DIAGNOSIS — Z8673 Personal history of transient ischemic attack (TIA), and cerebral infarction without residual deficits: Secondary | ICD-10-CM | POA: Insufficient documentation

## 2017-01-22 DIAGNOSIS — Z8546 Personal history of malignant neoplasm of prostate: Secondary | ICD-10-CM | POA: Diagnosis not present

## 2017-01-22 DIAGNOSIS — I129 Hypertensive chronic kidney disease with stage 1 through stage 4 chronic kidney disease, or unspecified chronic kidney disease: Secondary | ICD-10-CM | POA: Diagnosis not present

## 2017-01-22 LAB — CBC
HEMATOCRIT: 44.7 % (ref 40.0–52.0)
HEMOGLOBIN: 14.7 g/dL (ref 13.0–18.0)
MCH: 26.3 pg (ref 26.0–34.0)
MCHC: 32.9 g/dL (ref 32.0–36.0)
MCV: 79.9 fL — ABNORMAL LOW (ref 80.0–100.0)
Platelets: 160 10*3/uL (ref 150–440)
RBC: 5.6 MIL/uL (ref 4.40–5.90)
RDW: 15.8 % — ABNORMAL HIGH (ref 11.5–14.5)
WBC: 11.8 10*3/uL — AB (ref 3.8–10.6)

## 2017-01-22 LAB — URINE DRUG SCREEN, QUALITATIVE (ARMC ONLY)
Amphetamines, Ur Screen: NOT DETECTED
BARBITURATES, UR SCREEN: NOT DETECTED
Benzodiazepine, Ur Scrn: NOT DETECTED
CANNABINOID 50 NG, UR ~~LOC~~: NOT DETECTED
COCAINE METABOLITE, UR ~~LOC~~: NOT DETECTED
MDMA (Ecstasy)Ur Screen: NOT DETECTED
Methadone Scn, Ur: NOT DETECTED
OPIATE, UR SCREEN: NOT DETECTED
Phencyclidine (PCP) Ur S: NOT DETECTED
Tricyclic, Ur Screen: NOT DETECTED

## 2017-01-22 LAB — COMPREHENSIVE METABOLIC PANEL
ALT: 16 U/L — ABNORMAL LOW (ref 17–63)
ANION GAP: 10 (ref 5–15)
AST: 20 U/L (ref 15–41)
Albumin: 3.8 g/dL (ref 3.5–5.0)
Alkaline Phosphatase: 44 U/L (ref 38–126)
BUN: 25 mg/dL — ABNORMAL HIGH (ref 6–20)
CALCIUM: 8.7 mg/dL — AB (ref 8.9–10.3)
CO2: 21 mmol/L — ABNORMAL LOW (ref 22–32)
Chloride: 106 mmol/L (ref 101–111)
Creatinine, Ser: 1.47 mg/dL — ABNORMAL HIGH (ref 0.61–1.24)
GFR, EST AFRICAN AMERICAN: 52 mL/min — AB (ref >60)
GFR, EST NON AFRICAN AMERICAN: 45 mL/min — AB (ref >60)
Glucose, Bld: 119 mg/dL — ABNORMAL HIGH (ref 65–99)
POTASSIUM: 3.5 mmol/L (ref 3.5–5.1)
Sodium: 137 mmol/L (ref 135–145)
TOTAL PROTEIN: 8.1 g/dL (ref 6.5–8.1)
Total Bilirubin: 1.4 mg/dL — ABNORMAL HIGH (ref 0.3–1.2)

## 2017-01-22 LAB — URINALYSIS, COMPLETE (UACMP) WITH MICROSCOPIC
BACTERIA UA: NONE SEEN
BILIRUBIN URINE: NEGATIVE
GLUCOSE, UA: NEGATIVE mg/dL
HGB URINE DIPSTICK: NEGATIVE
Ketones, ur: NEGATIVE mg/dL
LEUKOCYTES UA: NEGATIVE
Nitrite: NEGATIVE
PROTEIN: 30 mg/dL — AB
Specific Gravity, Urine: 1.023 (ref 1.005–1.030)
Squamous Epithelial / LPF: NONE SEEN
pH: 5 (ref 5.0–8.0)

## 2017-01-22 LAB — ETHANOL: Alcohol, Ethyl (B): <5 mg/dL (ref <5)

## 2017-01-22 LAB — TSH: TSH: 3.36 u[IU]/mL (ref 0.350–4.500)

## 2017-01-22 LAB — TROPONIN I

## 2017-01-22 LAB — AMMONIA: Ammonia: <9 umol/L — ABNORMAL LOW (ref 9–35)

## 2017-01-22 MED ORDER — SODIUM CHLORIDE 0.9 % IV BOLUS (SEPSIS)
1000.0000 mL | Freq: Once | INTRAVENOUS | Status: AC
  Administered 2017-01-22: 1000 mL via INTRAVENOUS

## 2017-01-22 NOTE — ED Notes (Signed)
Pt alert and oriented X4, active, cooperative, pt in NAD. RR even and unlabored, color WNL.  Pt informed to return if any life threatening symptoms occur.   

## 2017-01-22 NOTE — ED Notes (Signed)
Pt states he is unable to urinate at this time.  Will attempt to collect urine specimen post IVF.

## 2017-01-22 NOTE — ED Triage Notes (Signed)
Pt to ED with wife from home c/o decreased appetite x1 week.  Denies weakness/tired, denies pain or SOB.  Wife states used enemas x2 at home with relief.  Was referred by PCP to come to ED.

## 2017-01-22 NOTE — ED Notes (Signed)
Blue top tube sent with save label to lab.

## 2017-01-22 NOTE — ED Provider Notes (Signed)
[facility] Emergency Department Provider Note  ____________________________________________   First MD Initiated Contact with Patient 01/22/17 [phone]     (approximate)  I have reviewed the triage vital signs and the nursing notes.   HISTORY  Chief Complaint Decreased appetite   HPI Philip Moreno is a 76 y.o. male with a history of arthritis as well as prostate cancer who is presenting to the emergency department today with 1 month of decreased appetite. He is accompanied by family who says that he has been more weak and fatigued lately as well. The patient denies any symptoms including any pain, shortness of breath. Denies any burning with urination.Family says that they believe that he may be depressed because his brother just died and was buried yesterday. However, the patient denies feeling depressed. Does not report any suicidal or homicidal ideation. Denies any focal weakness.   Past Medical History:  Diagnosis Date  . Arthritis   . Cancer [facility]) 05/2011   prostate cancer, [facility]  . GERD (gastroesophageal reflux disease)   . Hypertension   . Memory loss   . Seizures ([place])   . TIA (transient ischemic attack)     Patient Active Problem List   Diagnosis Date Noted  . Pneumonia 01/09/2016  . Gout attack 12/23/2015  . History of TIA (transient ischemic attack)   . Dementia   . Tachypnea   . Pyrexia   . Disorientation   . Hypokalemia   . AKI (acute kidney injury) ([place])   . Leukocytosis   . Acute blood loss anemia   . SIRS (systemic inflammatory response syndrome) (HCC)   . CVA (cerebral infarction) 12/21/2015  . Seizure ([place]) 12/20/2015  . Embolic stroke ([place]) 64/33/2951  . CKD (chronic kidney disease) stage 3, GFR 30-59 ml/min 12/20/2015  . Fever, unspecified 12/20/2015  . Other specified transient cerebral ischemias 05/02/2014  . Pre-syncope 04/24/2014  . 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 Arthritis 04/24/2014    . BP (high blood pressure) 04/24/2014  . Confusion state 02/28/2014  . Dizziness 02/28/2014  . Convulsions ([place]) 02/28/2014    Past Surgical History:  Procedure Laterality Date  . COLONOSCOPY WITH PROPOFOL N/A 02/27/2015   Procedure: COLONOSCOPY WITH PROPOFOL;  Surgeon: Christene Lye, MD;  Location: [facility] ENDOSCOPY;  Service: Endoscopy;  Laterality: N/A;  . LYMPHADENECTOMY  08/25/2012   Procedure: LYMPHADENECTOMY;  Surgeon: Dutch Gray, MD;  Location: [facility] ORS;  Service: Urology;  Laterality: Bilateral;  . PROSTATE SURGERY  2012  . ROBOT ASSISTED LAPAROSCOPIC RADICAL PROSTATECTOMY  08/25/2012   Procedure: ROBOTIC ASSISTED LAPAROSCOPIC RADICAL PROSTATECTOMY LEVEL 2;  Surgeon: Dutch Gray, MD;  Location: [facility] ORS;  Service: Urology;  Laterality: N/A;    Prior to Admission medications   Medication Sig Start Date End Date Taking? Authorizing Provider  clopidogrel (PLAVIX) 75 MG tablet Take 1 tablet (75 mg total) by mouth daily. 12/24/15   Oswald Hillock, MD  donepezil (ARICEPT) 10 MG tablet Take 1 tablet (10 mg total) by mouth at bedtime. 02/25/16   Melvenia Beam, MD  levETIRAcetam (KEPPRA) 500 MG tablet Take 2 tablets (1,000 mg total) by mouth 2 (two) times daily. 02/27/16   Melvenia Beam, MD  lisinopril (PRINIVIL,ZESTRIL) 20 MG tablet Take 20 mg by mouth daily.    [provider]  memantine (NAMENDA) 5 MG tablet Take 1 tablet (5 mg total) by mouth 2 (two) times daily. 02/25/16   Melvenia Beam, MD  metoprolol succinate (TOPROL-XL) 100 MG  24 hr tablet Take 100 mg by mouth daily. Take with or immediately following a meal.    [provider]  QUEtiapine (SEROQUEL) 25 MG tablet Take 1 tablet (25 mg total) by mouth at bedtime. 12/24/15   Oswald Hillock, MD    Allergies Patient has no known allergies.  Family History  Problem Relation Age of Onset  . Hypertension Mother   . Heart attack Father   . Cancer Brother   . Heart failure Brother   . Diabetes Brother      Social History Social History  Substance Use Topics  . Smoking status: Never Smoker  . Smokeless tobacco: Current User    Types: Snuff, Chew  . Alcohol use No    Review of Systems  Constitutional: No fever/chills Eyes: No visual changes. ENT: No sore throat. Cardiovascular: Denies chest pain. Respiratory: Denies shortness of breath. Gastrointestinal: No abdominal pain.  No nausea, no vomiting.  No diarrhea.  No constipation. Genitourinary: Negative for dysuria. Musculoskeletal: Negative for back pain. Skin: Negative for rash. Neurological: Negative for headaches, focal weakness or numbness.   ____________________________________________   PHYSICAL EXAM:  VITAL SIGNS: ED Triage Vitals  Enc Vitals Group     BP 01/22/17 1416 (!) 147/82     Pulse Rate 01/22/17 1416 66     Resp 01/22/17 1416 16     Temp 01/22/17 1416 98.3 F (36.8 C)     Temp Source 01/22/17 1416 Oral     SpO2 01/22/17 1416 96 %     Weight 01/22/17 1417 200 lb (90.7 kg)     Height 01/22/17 1417 6\' 3"  (1.905 m)     Head Circumference --      Peak Flow --      Pain Score --      Pain Loc --      Pain Edu? --      Excl. in [place]? --     Constitutional: Alert and oriented. Well appearing and in no acute distress. Eyes: Conjunctivae are normal.  Head: Atraumatic. Nose: No congestion/rhinnorhea. Mouth/Throat: Mucous membranes are moist.  Neck: No stridor.   Cardiovascular: Normal rate, regular rhythm. Grossly normal heart sounds.   Respiratory: Normal respiratory effort.  No retractions. Lungs CTAB. Gastrointestinal: Soft and nontender. No distention.  Musculoskeletal: No lower extremity tenderness nor edema.  No joint effusions. Neurologic:  Normal speech and language. No gross focal neurologic deficits are appreciated. Skin:  Skin is warm, dry and intact. No rash noted. Psychiatric: Mood and affect are normal. Speech and behavior are normal.  ____________________________________________    LABS (all labs ordered are listed, but only abnormal results are displayed)  Labs Reviewed  COMPREHENSIVE METABOLIC PANEL - Abnormal; Notable for the following:       Result Value   CO2 21 (*)    Glucose, Bld 119 (*)    BUN 25 (*)    Creatinine, Ser 1.47 (*)    Calcium 8.7 (*)    ALT 16 (*)    Total Bilirubin 1.4 (*)    GFR calc non Af Amer 45 (*)    GFR calc Af Amer 52 (*)    All other components within normal limits  CBC - Abnormal; Notable for the following:    WBC 11.8 (*)    MCV 79.9 (*)    RDW 15.8 (*)    All other components within normal limits  URINALYSIS, COMPLETE (UACMP) WITH MICROSCOPIC - Abnormal; Notable for the following:    Color,  Urine YELLOW (*)    APPearance CLEAR (*)    Protein, ur 30 (*)    All other components within normal limits  AMMONIA - Abnormal; Notable for the following:    Ammonia <9 (*)    All other components within normal limits  TROPONIN I  TSH  URINE DRUG SCREEN, QUALITATIVE ([facility] ONLY)  ETHANOL   ____________________________________________  EKG  ED ECG REPORT I, Doran Stabler, the attending physician, personally viewed and interpreted this ECG.  EKG completed but not export [employer]. I had a hard copy which was shredded and the patient was discharged and the hard copy was not reported. I did sign and DD EKG which did not have any ischemic changes. No ST elevations or depressions or T-wave inversions. ____________________________________________  RADIOLOGY  IMPRESSION: No evidence of acute intracranial abnormality.  Atrophy with small vessel ischemic changes.  ____________________________________________   PROCEDURES  Procedure(s) performed:   Procedures  Critical Care performed:   ____________________________________________   INITIAL IMPRESSION / ASSESSMENT AND PLAN / ED COURSE  Pertinent labs & imaging results that were available during my care of the patient were reviewed by me and considered in my  medical decision making (see chart for details).  ----------------------------------------- 9:29 PM on 01/22/2017 -----------------------------------------  Patient awake and alert at the time of discharge. Was able to drink by mouth fluids. Unclear cause of the patient's failure to thrive. His family is at bedside and says that they will continue to push ensure shakes. Despite this patient has lost 10 pounds over the past month. I believe the patient is appropriate for follow-up as an outpatient at this time. I will be discharging the patient with failure to thrive. However, the patient is not hypotensive or tachycardic. Has reassuring labs that are normal or at the patient's baseline. Patient is also awake and alert and without complaints.      ____________________________________________   FINAL CLINICAL IMPRESSION(S) / ED DIAGNOSES  Failure to thrive.    NEW MEDICATIONS STARTED DURING THIS VISIT:  Discharge Medication List as of 01/22/2017  8:58 PM       Note:  This document was prepared using Dragon voice recognition software and may include unintentional dictation errors.     Orbie Pyo, MD 01/22/17 2141

## 2017-06-13 ENCOUNTER — Inpatient Hospital Stay
Admission: EM | Admit: 2017-06-13 | Discharge: 2017-06-17 | DRG: 175 | Disposition: A | Discharge status: Skilled Nursing Facility | Payer: Medicare Other | Attending: Internal Medicine | Admitting: Internal Medicine

## 2017-06-13 ENCOUNTER — Emergency Department: Payer: Medicare Other

## 2017-06-13 ENCOUNTER — Encounter: Payer: Self-pay | Admitting: Emergency Medicine

## 2017-06-13 ENCOUNTER — Other Ambulatory Visit: Payer: Self-pay

## 2017-06-13 DIAGNOSIS — N183 Chronic kidney disease, stage 3 (moderate): Secondary | ICD-10-CM | POA: Diagnosis present

## 2017-06-13 DIAGNOSIS — R0902 Hypoxemia: Secondary | ICD-10-CM | POA: Diagnosis present

## 2017-06-13 DIAGNOSIS — I248 Other forms of acute ischemic heart disease: Secondary | ICD-10-CM | POA: Diagnosis present

## 2017-06-13 DIAGNOSIS — E876 Hypokalemia: Secondary | ICD-10-CM | POA: Diagnosis present

## 2017-06-13 DIAGNOSIS — I2699 Other pulmonary embolism without acute cor pulmonale: Secondary | ICD-10-CM | POA: Diagnosis present

## 2017-06-13 DIAGNOSIS — I700 Atherosclerosis of aorta: Secondary | ICD-10-CM | POA: Diagnosis present

## 2017-06-13 DIAGNOSIS — J9601 Acute respiratory failure with hypoxia: Secondary | ICD-10-CM | POA: Diagnosis present

## 2017-06-13 DIAGNOSIS — Z809 Family history of malignant neoplasm, unspecified: Secondary | ICD-10-CM | POA: Diagnosis not present

## 2017-06-13 DIAGNOSIS — Z7901 Long term (current) use of anticoagulants: Secondary | ICD-10-CM

## 2017-06-13 DIAGNOSIS — R066 Hiccough: Secondary | ICD-10-CM | POA: Diagnosis present

## 2017-06-13 DIAGNOSIS — K219 Gastro-esophageal reflux disease without esophagitis: Secondary | ICD-10-CM | POA: Diagnosis present

## 2017-06-13 DIAGNOSIS — F17220 Nicotine dependence, chewing tobacco, uncomplicated: Secondary | ICD-10-CM | POA: Diagnosis present

## 2017-06-13 DIAGNOSIS — Z833 Family history of diabetes mellitus: Secondary | ICD-10-CM

## 2017-06-13 DIAGNOSIS — M1990 Unspecified osteoarthritis, unspecified site: Secondary | ICD-10-CM | POA: Diagnosis present

## 2017-06-13 DIAGNOSIS — N179 Acute kidney failure, unspecified: Secondary | ICD-10-CM | POA: Diagnosis present

## 2017-06-13 DIAGNOSIS — F0390 Unspecified dementia without behavioral disturbance: Secondary | ICD-10-CM | POA: Diagnosis present

## 2017-06-13 DIAGNOSIS — Z79899 Other long term (current) drug therapy: Secondary | ICD-10-CM

## 2017-06-13 DIAGNOSIS — I69351 Hemiplegia and hemiparesis following cerebral infarction affecting right dominant side: Secondary | ICD-10-CM

## 2017-06-13 DIAGNOSIS — J159 Unspecified bacterial pneumonia: Secondary | ICD-10-CM | POA: Diagnosis present

## 2017-06-13 DIAGNOSIS — Z7902 Long term (current) use of antithrombotics/antiplatelets: Secondary | ICD-10-CM | POA: Diagnosis not present

## 2017-06-13 DIAGNOSIS — R0602 Shortness of breath: Secondary | ICD-10-CM | POA: Diagnosis not present

## 2017-06-13 DIAGNOSIS — G40909 Epilepsy, unspecified, not intractable, without status epilepticus: Secondary | ICD-10-CM | POA: Diagnosis present

## 2017-06-13 DIAGNOSIS — Z8249 Family history of ischemic heart disease and other diseases of the circulatory system: Secondary | ICD-10-CM | POA: Diagnosis not present

## 2017-06-13 DIAGNOSIS — I129 Hypertensive chronic kidney disease with stage 1 through stage 4 chronic kidney disease, or unspecified chronic kidney disease: Secondary | ICD-10-CM | POA: Diagnosis present

## 2017-06-13 DIAGNOSIS — R509 Fever, unspecified: Secondary | ICD-10-CM

## 2017-06-13 DIAGNOSIS — Z8546 Personal history of malignant neoplasm of prostate: Secondary | ICD-10-CM | POA: Diagnosis not present

## 2017-06-13 DIAGNOSIS — Z86711 Personal history of pulmonary embolism: Secondary | ICD-10-CM | POA: Diagnosis present

## 2017-06-13 LAB — COMPREHENSIVE METABOLIC PANEL
ALK PHOS: 44 U/L (ref 38–126)
ALT: 13 U/L — ABNORMAL LOW (ref 17–63)
ANION GAP: 12 (ref 5–15)
AST: 25 U/L (ref 15–41)
Albumin: 3.7 g/dL (ref 3.5–5.0)
BILIRUBIN TOTAL: 1.2 mg/dL (ref 0.3–1.2)
BUN: 22 mg/dL — AB (ref 6–20)
CALCIUM: 9.1 mg/dL (ref 8.9–10.3)
CO2: 22 mmol/L (ref 22–32)
Chloride: 105 mmol/L (ref 101–111)
Creatinine, Ser: 1.53 mg/dL — ABNORMAL HIGH (ref 0.61–1.24)
GFR calc Af Amer: 50 mL/min — ABNORMAL LOW (ref >60)
GFR, EST NON AFRICAN AMERICAN: 43 mL/min — AB (ref >60)
GLUCOSE: 122 mg/dL — AB (ref 65–99)
POTASSIUM: 3 mmol/L — AB (ref 3.5–5.1)
Sodium: 139 mmol/L (ref 135–145)
TOTAL PROTEIN: 8.4 g/dL — AB (ref 6.5–8.1)

## 2017-06-13 LAB — CBC WITH DIFFERENTIAL/PLATELET
BASOS PCT: 0 %
Basophils Absolute: 0 10*3/uL (ref 0–0.1)
EOS ABS: 0 10*3/uL (ref 0–0.7)
EOS PCT: 0 %
HCT: 45.5 % (ref 40.0–52.0)
HEMOGLOBIN: 14.6 g/dL (ref 13.0–18.0)
LYMPHS ABS: 1.3 10*3/uL (ref 1.0–3.6)
Lymphocytes Relative: 12 %
MCH: 25.7 pg — AB (ref 26.0–34.0)
MCHC: 32.2 g/dL (ref 32.0–36.0)
MCV: 80 fL (ref 80.0–100.0)
MONOS PCT: 12 %
Monocytes Absolute: 1.3 10*3/uL — ABNORMAL HIGH (ref 0.2–1.0)
NEUTROS PCT: 76 %
Neutro Abs: 7.9 10*3/uL — ABNORMAL HIGH (ref 1.4–6.5)
PLATELETS: 190 10*3/uL (ref 150–440)
RBC: 5.69 MIL/uL (ref 4.40–5.90)
RDW: 15.3 % — ABNORMAL HIGH (ref 11.5–14.5)
WBC: 10.5 10*3/uL (ref 3.8–10.6)

## 2017-06-13 LAB — GLUCOSE, CAPILLARY: GLUCOSE-CAPILLARY: 114 mg/dL — AB (ref 65–99)

## 2017-06-13 LAB — MRSA PCR SCREENING: MRSA BY PCR: NEGATIVE

## 2017-06-13 LAB — LACTIC ACID, PLASMA: Lactic Acid, Venous: 1.7 mmol/L (ref 0.5–1.9)

## 2017-06-13 LAB — PROTIME-INR
INR: 1.16
PROTHROMBIN TIME: 14.7 s (ref 11.4–15.2)

## 2017-06-13 LAB — APTT: aPTT: 33 seconds (ref 24–36)

## 2017-06-13 LAB — TROPONIN I
TROPONIN I: 0.09 ng/mL — AB (ref <0.03)
TROPONIN I: 0.08 ng/mL — AB (ref <0.03)
Troponin I: 0.09 ng/mL (ref <0.03)

## 2017-06-13 LAB — HEPARIN LEVEL (UNFRACTIONATED): HEPARIN UNFRACTIONATED: 0.32 [IU]/mL (ref 0.30–0.70)

## 2017-06-13 IMAGING — CT CT HEAD W/O CM
1 series · 15 of 30 positions shown, 19 images · non-contrast
Comparison: 07/12/2015

CLINICAL DATA: Patient states "My pressure is up and I can't get it
down" and headache . Onset of symptoms yesterday. Denies CP or SOB.
Patient states he checks his BP at home. This morning BP was
177/104. Denies trauma.

EXAM:
CT HEAD WITHOUT CONTRAST
TECHNIQUE: Contiguous axial images were obtained from the base of the skull
through the vertex without intravenous contrast.

[Series 2: head wo · axial · 0.45mm/px · z∈[+123,+249]mm · 15 of 32 slices shown, 19 images]
[im 2/32  brain]
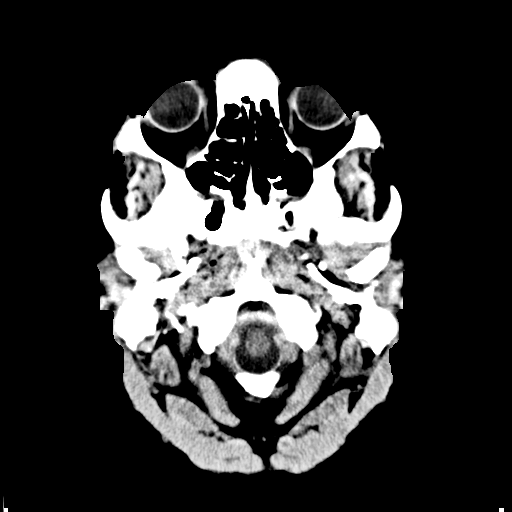
[im 2/32  bone]
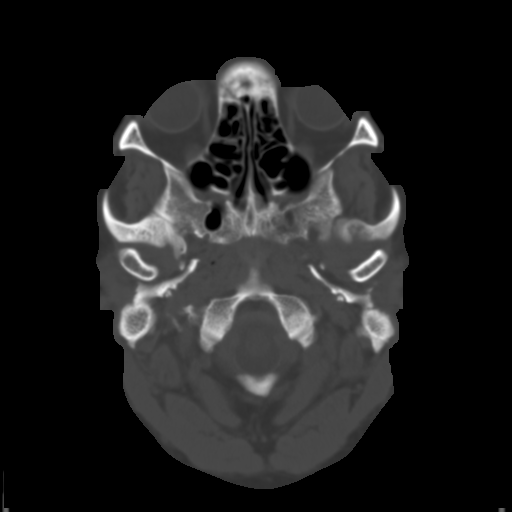
[im 4/32  brain]
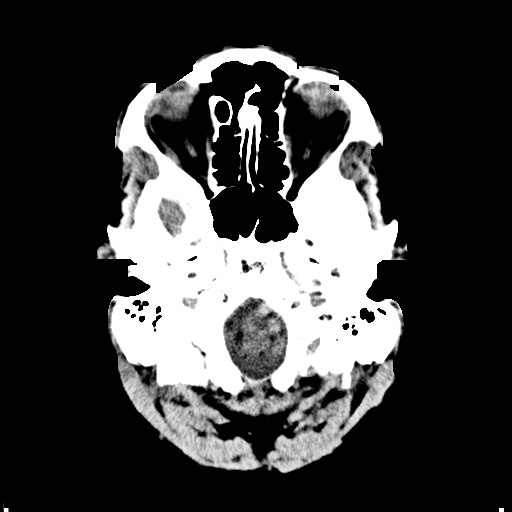
[im 6/32  brain]
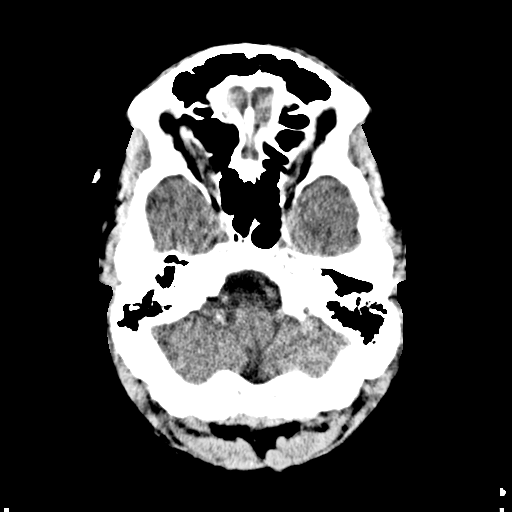
[im 8/32  brain]
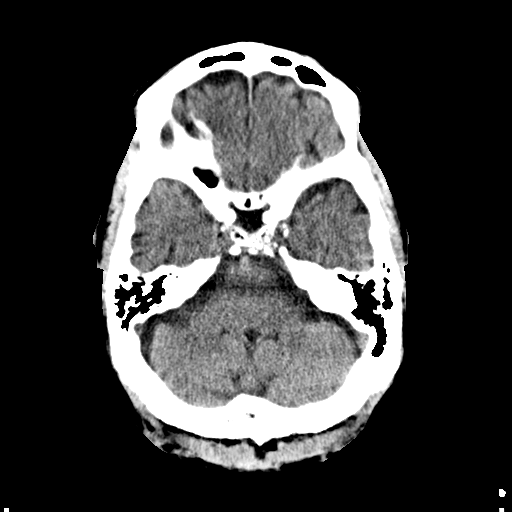
[im 10/32  brain]
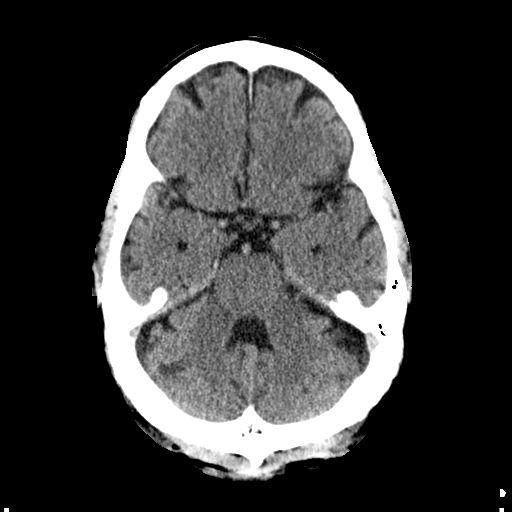
[im 10/32  bone]
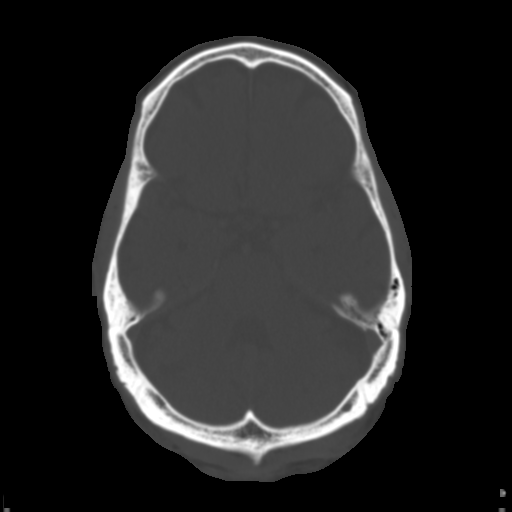
[im 12/32  brain]
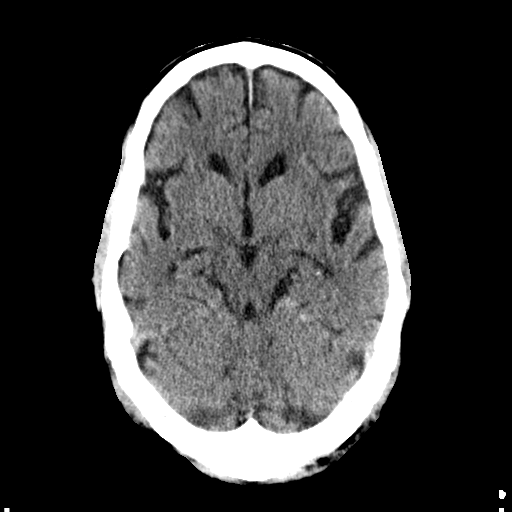
[im 14/32  brain]
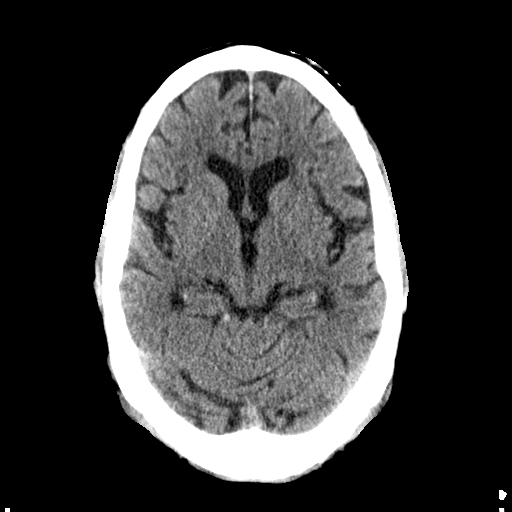
[im 17/32  brain]
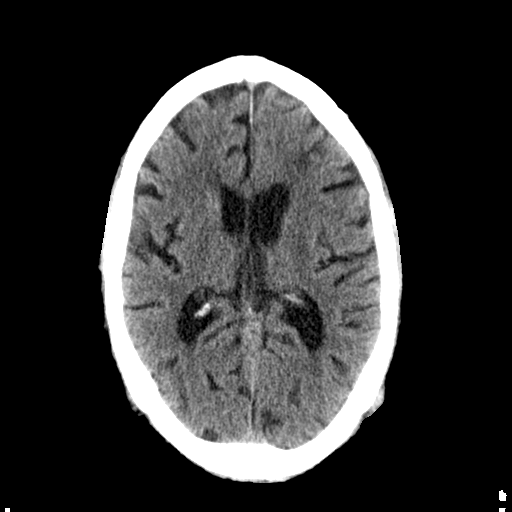
[im 18/32  brain]
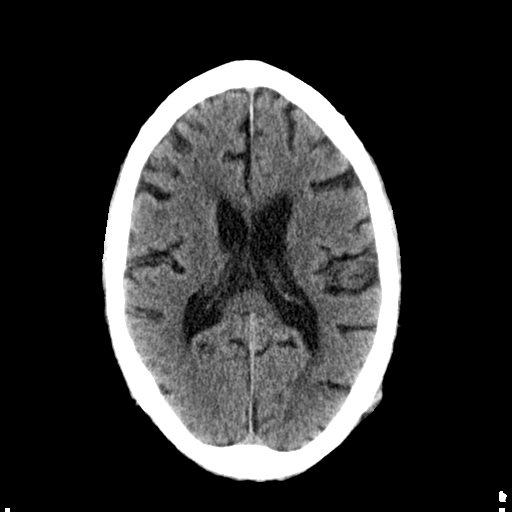
[im 18/32  bone]
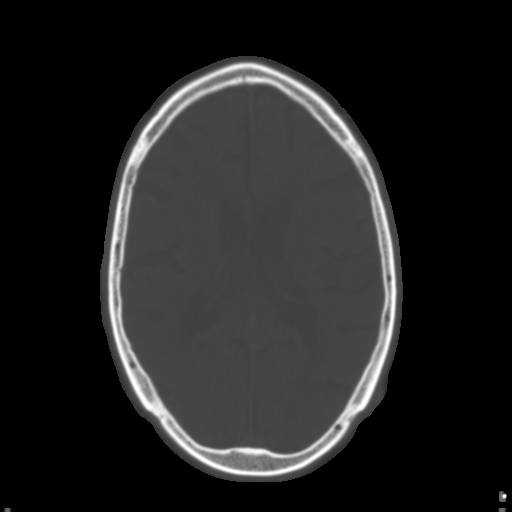
[im 20/32  brain]
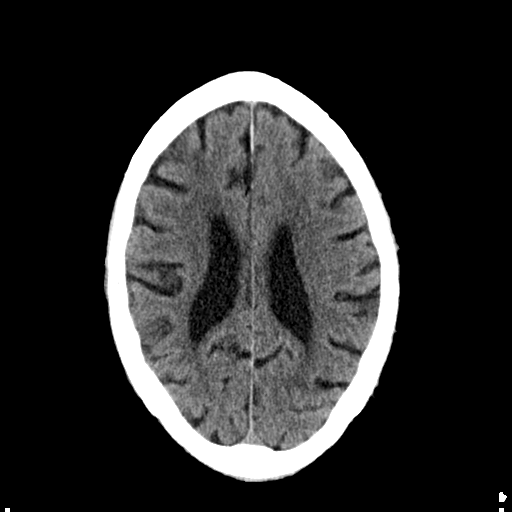
[im 22/32  brain]
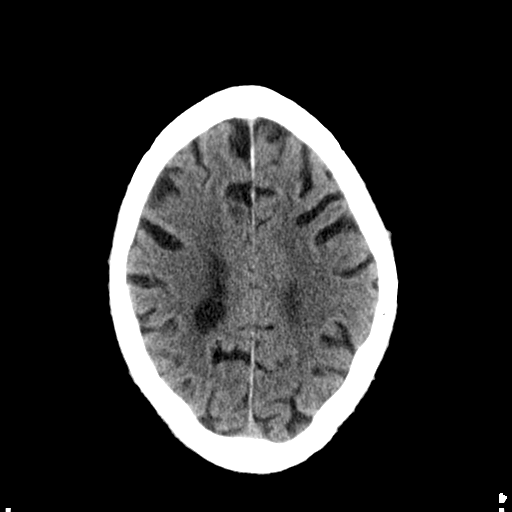
[im 24/32  brain]
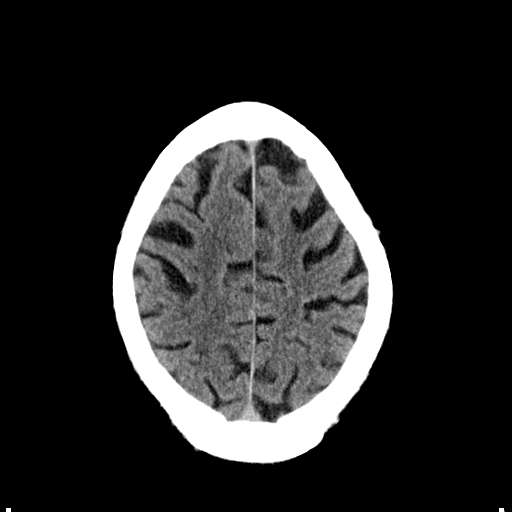
[im 26/32  brain]
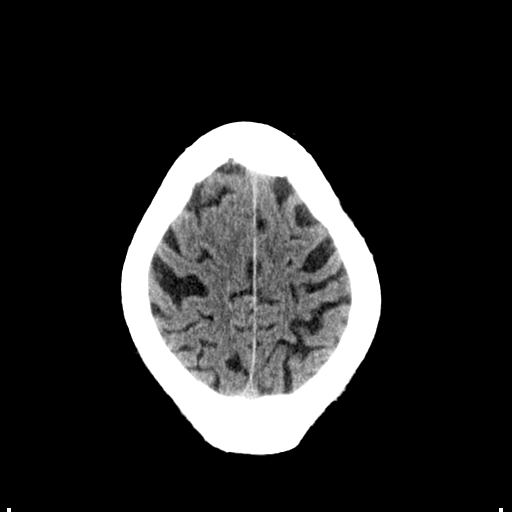
[im 26/32  bone]
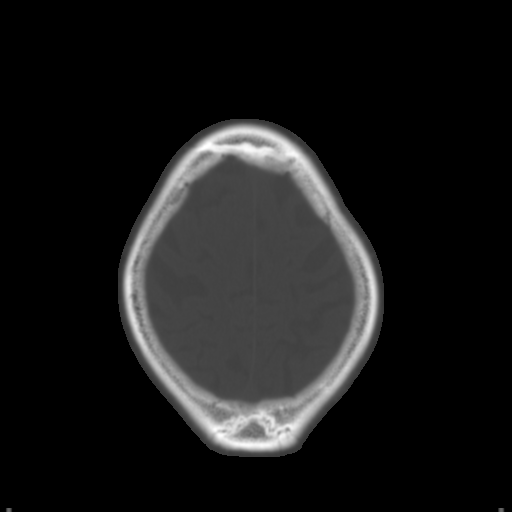
[im 28/32  brain]
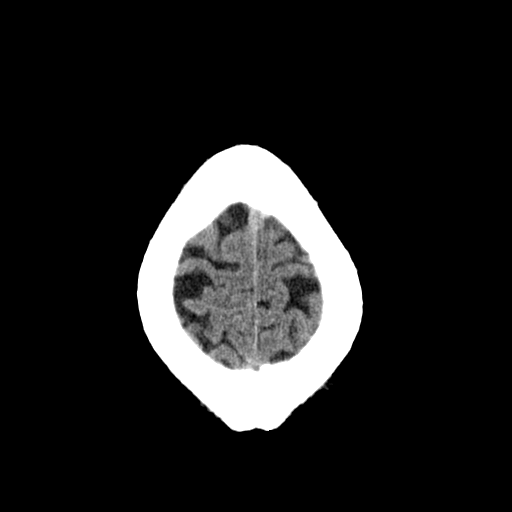
[im 30/32  brain]
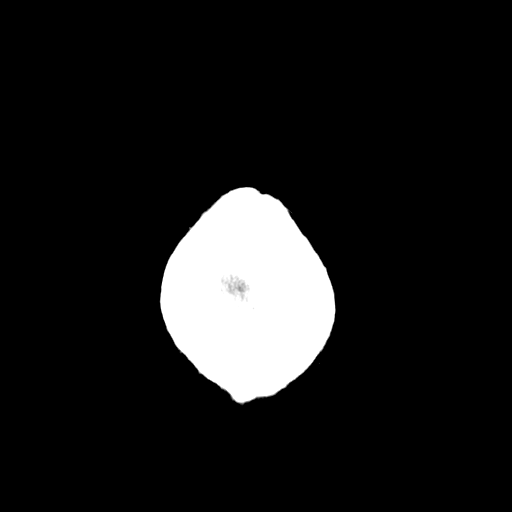

[15 of 30 positions shown; findings below may reference images not displayed]

FINDINGS: There is central and cortical atrophy. Periventricular white matter
changes are consistent with small vessel disease. Subtle
low-attenuation within the left thalamus consistent with chronic
lacunar infarct. There is no intra or extra-axial fluid collection
or mass lesion. The basilar cisterns and ventricles have a normal
appearance. There is no CT evidence for acute infarction or
hemorrhage.

Bone windows demonstrate focal sclerotic lesion within the squamous
portion of the right temporal bone. No calvarial fracture. There is
dense atherosclerotic calcification of the internal carotid
arteries.
IMPRESSION: 1. Atrophy and small vessel disease.
2.  No evidence for acute intracranial abnormality.
3. Chronic ischemic change in the left thalamus.
4. Sclerotic lesion in the right temporal bone. Consider metastatic
lesion versus benign sclerotic process.

## 2017-06-13 MED ORDER — METOCLOPRAMIDE HCL 5 MG/ML IJ SOLN
5.0000 mg | Freq: Three times a day (TID) | INTRAMUSCULAR | Status: DC
  Administered 2017-06-13 – 2017-06-14 (×2): 5 mg via INTRAVENOUS
  Filled 2017-06-13 (×2): qty 2

## 2017-06-13 MED ORDER — LEVOFLOXACIN IN D5W 500 MG/100ML IV SOLN
500.0000 mg | Freq: Once | INTRAVENOUS | Status: AC
  Administered 2017-06-14: 500 mg via INTRAVENOUS
  Filled 2017-06-13: qty 100

## 2017-06-13 MED ORDER — ACETAMINOPHEN 325 MG PO TABS
650.0000 mg | ORAL_TABLET | Freq: Four times a day (QID) | ORAL | Status: DC | PRN
  Administered 2017-06-15: 650 mg via ORAL
  Filled 2017-06-13: qty 2

## 2017-06-13 MED ORDER — HYDRALAZINE HCL 20 MG/ML IJ SOLN
5.0000 mg | Freq: Four times a day (QID) | INTRAMUSCULAR | Status: DC | PRN
Start: 2017-06-13 — End: 2017-06-17
  Administered 2017-06-13 – 2017-06-16 (×3): 5 mg via INTRAVENOUS
  Filled 2017-06-13 (×3): qty 1

## 2017-06-13 MED ORDER — ONDANSETRON HCL 4 MG/2ML IJ SOLN: 4.0000 mg | Freq: Four times a day (QID) | INTRAMUSCULAR | Status: DC | PRN

## 2017-06-13 MED ORDER — SODIUM CHLORIDE 0.9 % IV BOLUS (SEPSIS)
1000.0000 mL | Freq: Once | INTRAVENOUS | Status: AC
  Administered 2017-06-13: 1000 mL via INTRAVENOUS

## 2017-06-13 MED ORDER — SODIUM CHLORIDE 0.9 % IV SOLN
INTRAVENOUS | Status: DC
  Administered 2017-06-13: 17:00:00 via INTRAVENOUS

## 2017-06-13 MED ORDER — QUETIAPINE FUMARATE 25 MG PO TABS
25.0000 mg | ORAL_TABLET | Freq: Every day | ORAL | Status: DC
  Administered 2017-06-13 – 2017-06-16 (×4): 25 mg via ORAL
  Filled 2017-06-13 (×4): qty 1

## 2017-06-13 MED ORDER — CEFTRIAXONE SODIUM IN DEXTROSE 20 MG/ML IV SOLN
1.0000 g | Freq: Once | INTRAVENOUS | Status: AC
  Administered 2017-06-13: 1 g via INTRAVENOUS
  Filled 2017-06-13: qty 50

## 2017-06-13 MED ORDER — HEPARIN (PORCINE) IN NACL 100-0.45 UNIT/ML-% IJ SOLN
1400.0000 [IU]/h | INTRAMUSCULAR | Status: DC
  Administered 2017-06-13: 1300 [IU]/h via INTRAVENOUS
  Filled 2017-06-13 (×3): qty 250

## 2017-06-13 MED ORDER — DEXTROSE 5 % IV SOLN
500.0000 mg | Freq: Once | INTRAVENOUS | Status: AC
  Administered 2017-06-13: 500 mg via INTRAVENOUS
  Filled 2017-06-13: qty 500

## 2017-06-13 MED ORDER — DONEPEZIL HCL 5 MG PO TABS
10.0000 mg | ORAL_TABLET | Freq: Every day | ORAL | Status: DC
  Filled 2017-06-13: qty 1

## 2017-06-13 MED ORDER — HEPARIN BOLUS VIA INFUSION
4700.0000 [IU] | Freq: Once | INTRAVENOUS | Status: AC
  Administered 2017-06-13: 4700 [IU] via INTRAVENOUS
  Filled 2017-06-13: qty 4700

## 2017-06-13 MED ORDER — IOPAMIDOL (ISOVUE-370) INJECTION 76%
75.0000 mL | Freq: Once | INTRAVENOUS | Status: AC | PRN
  Administered 2017-06-13: 75 mL via INTRAVENOUS

## 2017-06-13 MED ORDER — METOPROLOL SUCCINATE ER 100 MG PO TB24
100.0000 mg | ORAL_TABLET | Freq: Every day | ORAL | Status: DC
Start: 2017-06-14 — End: 2017-06-17
  Administered 2017-06-14 – 2017-06-17 (×4): 100 mg via ORAL
  Filled 2017-06-13 (×2): qty 1
  Filled 2017-06-13: qty 2
  Filled 2017-06-13: qty 1

## 2017-06-13 MED ORDER — ONDANSETRON HCL 4 MG PO TABS: 4.0000 mg | ORAL_TABLET | Freq: Four times a day (QID) | ORAL | Status: DC | PRN

## 2017-06-13 MED ORDER — LEVETIRACETAM 500 MG PO TABS
1000.0000 mg | ORAL_TABLET | Freq: Two times a day (BID) | ORAL | Status: DC
  Administered 2017-06-13 – 2017-06-17 (×8): 1000 mg via ORAL
  Filled 2017-06-13 (×9): qty 2

## 2017-06-13 MED ORDER — LEVOFLOXACIN IN D5W 250 MG/50ML IV SOLN: 250.0000 mg | INTRAVENOUS | Status: DC

## 2017-06-13 MED ORDER — PIPERACILLIN-TAZOBACTAM 3.375 G IVPB
3.3750 g | Freq: Three times a day (TID) | INTRAVENOUS | Status: DC
  Administered 2017-06-13 – 2017-06-14 (×3): 3.375 g via INTRAVENOUS
  Filled 2017-06-13 (×3): qty 50

## 2017-06-13 MED ORDER — ACETAMINOPHEN 650 MG RE SUPP: 650.0000 mg | Freq: Four times a day (QID) | RECTAL | Status: DC | PRN

## 2017-06-13 MED ORDER — PANTOPRAZOLE SODIUM 40 MG IV SOLR
40.0000 mg | Freq: Two times a day (BID) | INTRAVENOUS | Status: DC
  Administered 2017-06-13 – 2017-06-14 (×2): 40 mg via INTRAVENOUS
  Filled 2017-06-13 (×2): qty 40

## 2017-06-13 NOTE — ED Provider Notes (Signed)
[facility] Emergency Department Provider Note ____________________________________________   I have reviewed the triage vital signs and the triage nursing note.  HISTORY  Chief Complaint Weakness   Historian Patient  HPI Philip Moreno is a 76 y.o. male here with his wife, history of seizures, last in July, also history of some chronic right-sided leg weakness from prior CVA, reports significant generalized weakness this morning no new focal weakness.  He states he had a mild cough, nonproductive.  Chills last night and today, without fever.  Lives at home with his spouse.  Nothing makes it worse or better. Past Medical History:  Diagnosis Date  . Arthritis   . Cancer [facility]) 05/2011   prostate cancer, [facility]  . GERD (gastroesophageal reflux disease)   . Hypertension   . Memory loss   . Seizures ([place])   . TIA (transient ischemic attack)     Patient Active Problem List   Diagnosis Date Noted  . Pneumonia 01/09/2016  . Gout attack 12/23/2015  . History of TIA (transient ischemic attack)   . Dementia   . Tachypnea   . Pyrexia   . Disorientation   . Hypokalemia   . AKI (acute kidney injury) ([place])   . Leukocytosis   . Acute blood loss anemia   . SIRS (systemic inflammatory response syndrome) (HCC)   . CVA (cerebral infarction) 12/21/2015  . Seizure ([place]) 12/20/2015  . Embolic stroke ([place]) 62/37/6283  . CKD (chronic kidney disease) stage 3, GFR 30-59 ml/min (HCC) 12/20/2015  . Fever, unspecified 12/20/2015  . Other specified transient cerebral ischemias 05/02/2014  . Pre-syncope 04/24/2014  . 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 Arthritis 04/24/2014  . BP (high blood pressure) 04/24/2014  . Confusion state 02/28/2014  . Dizziness 02/28/2014  . Convulsions ([place]) 02/28/2014    Past Surgical History:  Procedure Laterality Date  . PROSTATE SURGERY  2012    Prior to Admission medications   Medication Sig Start Date  End Date Taking? Authorizing Provider  clopidogrel (PLAVIX) 75 MG tablet Take 1 tablet (75 mg total) by mouth daily. 12/24/15  Yes Oswald Hillock, MD  levETIRAcetam (KEPPRA) 500 MG tablet Take 2 tablets (1,000 mg total) by mouth 2 (two) times daily. Patient taking differently: Take 2 (two) times daily by mouth.  02/27/16  Yes Melvenia Beam, MD  lisinopril (PRINIVIL,ZESTRIL) 20 MG tablet Take 20 mg by mouth daily.   Yes [provider]  metoprolol succinate (TOPROL-XL) 100 MG 24 hr tablet Take 100 mg by mouth daily. Take with or immediately following a meal.   Yes [provider]  donepezil (ARICEPT) 10 MG tablet Take 1 tablet (10 mg total) by mouth at bedtime. Patient not taking: Reported on 06/13/2017 02/25/16   Melvenia Beam, MD  memantine (NAMENDA) 5 MG tablet Take 1 tablet (5 mg total) by mouth 2 (two) times daily. Patient not taking: Reported on 06/13/2017 02/25/16   Melvenia Beam, MD  QUEtiapine (SEROQUEL) 25 MG tablet Take 1 tablet (25 mg total) by mouth at bedtime. Patient not taking: Reported on 06/13/2017 12/24/15   Oswald Hillock, MD    No Known Allergies  Family History  Problem Relation Age of Onset  . Hypertension Mother   . Heart attack Father   . Cancer Brother   . Heart failure Brother   . Diabetes Brother     Social History Social History   Tobacco Use  . Smoking status: Never Smoker  .  Smokeless tobacco: Current User    Types: Snuff, Chew  Substance Use Topics  . Alcohol use: No    Alcohol/week: 0.0 oz  . Drug use: No    Review of Systems  Constitutional: Negative for fever. Eyes: Negative for visual changes. ENT: Negative for sore throat. Cardiovascular: Negative for chest pain. Respiratory: Negative for shortness of breath. Gastrointestinal: Negative for abdominal pain, vomiting and diarrhea. Genitourinary: Negative for dysuria. Musculoskeletal: Negative for back pain. Skin: Negative for rash. Neurological: Negative for  headache.  ____________________________________________   PHYSICAL EXAM:  VITAL SIGNS: ED Triage Vitals [06/13/17 1136]  Enc Vitals Group     BP      Pulse      Resp      Temp      Temp src      SpO2 (!) 87 %     Weight      Height      Head Circumference      Peak Flow      Pain Score      Pain Loc      Pain Edu?      Excl. in [place]?      Constitutional: Alert and oriented. Well appearing overall, no respiratory distress despite hypoxia HEENT   Head: Normocephalic and atraumatic.      Eyes: Conjunctivae are normal. Pupils equal and round.       Ears:         Nose: No congestion/rhinnorhea.   Mouth/Throat: Mucous membranes are moist.   Neck: No stridor. Cardiovascular/Chest: Normal rate, regular rhythm.  No murmurs, rubs, or gallops. Respiratory: Normal respiratory effort without tachypnea nor retractions. Breath sounds are clear and equal bilaterally. No wheezes/rales/very possible mild rhonchi at bases. Gastrointestinal: Soft. No distention, no guarding, no rebound. Nontender.    Genitourinary/rectal:Deferred Musculoskeletal: Nontender with normal range of motion in all extremities. No joint effusions.  No lower extremity tenderness.  No edema. Neurologic:  Normal speech and language. No gross or focal neurologic deficits are appreciated. Skin:  Skin is warm, dry and intact. No rash noted. Psychiatric: Mood and affect are normal. Speech and behavior are normal. Patient exhibits appropriate insight and judgment.   ____________________________________________  LABS (pertinent positives/negatives) I, Lisa Roca, MD the attending physician have reviewed the labs noted below.  Labs Reviewed  COMPREHENSIVE METABOLIC PANEL - Abnormal; Notable for the following components:      Result Value   Potassium 3.0 (*)    Glucose, Bld 122 (*)    BUN 22 (*)    Creatinine, Ser 1.53 (*)    Total Protein 8.4 (*)    ALT 13 (*)    GFR calc non Af Amer 43 (*)    GFR calc  Af Amer 50 (*)    All other components within normal limits  TROPONIN I - Abnormal; Notable for the following components:   Troponin I 0.09 (*)    All other components within normal limits  CBC WITH DIFFERENTIAL/PLATELET - Abnormal; Notable for the following components:   MCH 25.7 (*)    RDW 15.3 (*)    Neutro Abs 7.9 (*)    Monocytes Absolute 1.3 (*)    All other components within normal limits  CULTURE, BLOOD (ROUTINE X 2)  CULTURE, BLOOD (ROUTINE X 2)  LACTIC ACID, PLASMA  LACTIC ACID, PLASMA    ____________________________________________    EKG I, Lisa Roca, MD, the attending physician have personally viewed and interpreted all ECGs.  94 bpm.  Normal  sinus rhythm.  Left axis deviation.  Nonspecific ST and T wave ____________________________________________  RADIOLOGY All Xrays were viewed by me.  Imaging interpreted by Radiologist, and I, Lisa Roca, MD the attending physician have reviewed the radiologist interpretation noted below.  Chest x-ray portable:  IMPRESSION: Apparent right perihilar airspace consolidation versus pulmonary mass.  Mild pulmonary vascular congestion.  Ct chest angio:  IMPRESSION: 1. Pulmonary emboli in both upper lobe pulmonary arteries as well as an incompletely obstructing pulmonary embolus in the proximal right lower lobe pulmonary artery. There is incompletely obstructing thrombus along the superior aspect of the distal portion of the left main pulmonary artery. No right heart strain evident. There is left ventricular hypertrophy.  2. There is airspace consolidation consistent with pneumonia in both lower lobes. Probable infarct in the right upper lobe near the junction of the anterior and posterior segments.  3. Scattered subcentimeter lymph nodes without frank adenopathy.  4. Reflux of contrast into the inferior vena cava and hepatic veins is felt to be indicative of increase in right heart pressure.  5. Aortic  atherosclerosis. No aneurysm or dissection evident in the thoracic aorta.  Critical Value/emergent results were called by telephone at the time of interpretation on 06/13/2017 at 1:53 pm to Dr. Lisa Roca , who verbally acknowledged these results.  Aortic Atherosclerosis (ICD10-I70.0). __________________________________________  PROCEDURES  Procedure(s) performed: None  Critical Care performed: CRITICAL CARE Performed by: Lisa Roca   Total critical care time: 30 minutes  Critical care time was exclusive of separately billable procedures and treating other patients.  Critical care was necessary to treat or prevent imminent or life-threatening deterioration.  Critical care was time spent personally by me on the following activities: development of treatment plan with patient and/or surrogate as well as nursing, discussions with consultants, evaluation of patient's response to treatment, examination of patient, obtaining history from patient or surrogate, ordering and performing treatments and interventions, ordering and review of laboratory studies, ordering and review of radiographic studies, pulse oximetry and re-evaluation of patient's condition.    ____________________________________________  ED COURSE / ASSESSMENT AND PLAN  Pertinent labs & imaging results that were available during my care of the patient were reviewed by me and considered in my medical decision making (see chart for details).   Patient was apparently fairly hypoxic into the 70s prior to arrival, on nonrebreather up to the mid 80s, and then finally came up to around 90-92% on a nonrebreather.  He reports a mild but nonproductive cough.  He reports chills without fever.  His lungs overall sound pretty clear and without wheezing.  He has no underlying lung conditions.  No chest pain.  No history of CHF or leg swelling.  A quick view of bedside portable, no obvious infiltrate or collapsed lung.   Radiologist result indicated possible pneumonia versus hilar mass.  Given no elevated white blood cell count, or fever, I am going to go ahead and obtain CT of the chest for further elucidation, and make it a PE study to rule out blood clot with a profound hypoxia.  Called by radiologist, positive for multiple bilateral PEs.  Placed order for heparin per pharmacy consult for pulmonary embolism.  Discussed with hospitalist for admission.  Updated the family.  Continues to be stable on nonrebreather.  Reviewed radiology report -- also concerning for pneumonia, will cover with rocephin and azithromycin.  No signs of sepsis at present.  DIFFERENTIAL DIAGNOSIS: Differential includes, but is not limited to, viral syndrome, bronchitis including COPD  exacerbation, pneumonia, reactive airway disease including asthma, CHF including exacerbation with or without pulmonary/interstitial edema, pneumothorax, ACS, thoracic trauma, and pulmonary embolism.  CONSULTATIONS:   Hospitalist for admission.   Patient / Family / Caregiver informed of clinical course, medical decision-making process, and agree with plan.   ___________________________________________   FINAL CLINICAL IMPRESSION(S) / ED DIAGNOSES   Final diagnoses:  Hypoxia  Other acute pulmonary embolism without acute cor pulmonale ([place])      ___________________________________________  ED Discharge Orders    None            Note: This dictation was prepared with Dragon dictation. Any transcriptional errors that result from this process are unintentional    Lisa Roca, MD 06/13/17 1408

## 2017-06-13 NOTE — ED Notes (Signed)
Patient transported to CT 

## 2017-06-13 NOTE — ED Notes (Signed)
Pt on 3L [place]. D/c nonrebreather

## 2017-06-13 NOTE — H&P (Signed)
Philip Moreno is an 76 y.o. male.   Chief Complaint: Weakness HPI: This is 76 year old male who has a history of dementia. According to his wife is present he's been bleeding a very sedentary lifestyle. Yesterday he became weak. He started having chills. Today he was even more weak and fell. She called EMS they brought him in. He was diagnosed with pneumonia and bilateral PEs. He denies any chest pain. No swelling of the lower extremities or pain. Is currently on 100% nonrebreather but looks fairly comfortable.  Past Medical History:  Diagnosis Date  . Arthritis   . Cancer [facility]) 05/2011   prostate cancer, [facility]  . GERD (gastroesophageal reflux disease)   . Hypertension   . Memory loss   . Seizures ([place])   . TIA (transient ischemic attack)     Past Surgical History:  Procedure Laterality Date  . PROSTATE SURGERY  2012    Family History  Problem Relation Age of Onset  . Hypertension Mother   . Heart attack Father   . Cancer Brother   . Heart failure Brother   . Diabetes Brother    Social History:  reports that  has never smoked. His smokeless tobacco use includes snuff and chew. He reports that he does not drink alcohol or use drugs.  Allergies: No Known Allergies   (Not in a hospital admission)  Results for orders placed or performed during the hospital encounter of 06/13/17 (from the past 48 hour(s))  Comprehensive metabolic panel     Status: Abnormal   Collection Time: 06/13/17 11:42 AM  Result Value Ref Range   Sodium 139 135 - 145 mmol/L   Potassium 3.0 (L) 3.5 - 5.1 mmol/L   Chloride 105 101 - 111 mmol/L   CO2 22 22 - 32 mmol/L   Glucose, Bld 122 (H) 65 - 99 mg/dL   BUN 22 (H) 6 - 20 mg/dL   Creatinine, Ser 1.53 (H) 0.61 - 1.24 mg/dL   Calcium 9.1 8.9 - 10.3 mg/dL   Total Protein 8.4 (H) 6.5 - 8.1 g/dL   Albumin 3.7 3.5 - 5.0 g/dL   AST 25 15 - 41 U/L   ALT 13 (L) 17 - 63 U/L   Alkaline Phosphatase 44 38 - 126 U/L   Total Bilirubin 1.2 0.3 - 1.2 mg/dL   GFR calc non Af Amer 43 (L) >60 mL/min   GFR calc Af Amer 50 (L) >60 mL/min    Comment: (NOTE) The eGFR has been calculated using the CKD EPI equation. This calculation has not been validated in all clinical situations. eGFR's persistently <60 mL/min signify possible Chronic Kidney Disease.    Anion gap 12 5 - 15  Troponin I     Status: Abnormal   Collection Time: 06/13/17 11:42 AM  Result Value Ref Range   Troponin I 0.09 (HH) <0.03 ng/mL    Comment: CRITICAL RESULT CALLED TO, READ BACK BY AND VERIFIED WITH ALLISON PATE @ 1610 06/13/17 [place]   CBC with Differential     Status: Abnormal   Collection Time: 06/13/17 11:42 AM  Result Value Ref Range   WBC 10.5 3.8 - 10.6 K/uL   RBC 5.69 4.40 - 5.90 MIL/uL   Hemoglobin 14.6 13.0 - 18.0 g/dL   HCT 45.5 40.0 - 52.0 %   MCV 80.0 80.0 - 100.0 fL   MCH 25.7 (L) 26.0 - 34.0 pg   MCHC 32.2 32.0 - 36.0 g/dL   RDW 15.3 (H) 11.5 - 14.5 %  Platelets 190 150 - 440 K/uL   Neutrophils Relative % 76 %   Neutro Abs 7.9 (H) 1.4 - 6.5 K/uL   Lymphocytes Relative 12 %   Lymphs Abs 1.3 1.0 - 3.6 K/uL   Monocytes Relative 12 %   Monocytes Absolute 1.3 (H) 0.2 - 1.0 K/uL   Eosinophils Relative 0 %   Eosinophils Absolute 0.0 0 - 0.7 K/uL   Basophils Relative 0 %   Basophils Absolute 0.0 0 - 0.1 K/uL  Lactic acid, plasma     Status: None   Collection Time: 06/13/17  1:38 PM  Result Value Ref Range   Lactic Acid, Venous 1.7 0.5 - 1.9 mmol/L   Ct Angio Chest Pe W/cm &/or Wo Cm  Result Date: 06/13/2017 CLINICAL DATA:  Shortness of breath with decreased oxygen saturation EXAM: CT ANGIOGRAPHY CHEST WITH CONTRAST TECHNIQUE: Multidetector CT imaging of the chest was performed using the standard protocol during bolus administration of intravenous contrast. Multiplanar CT image reconstructions and MIPs were obtained to evaluate the vascular anatomy. CONTRAST:  35m ISOVUE-370 IOPAMIDOL (ISOVUE-370) INJECTION 76% COMPARISON:  Chest CT January 09, 2016 and chest  radiograph June 13, 2017 FINDINGS: Cardiovascular: There is a pulmonary embolus, incompletely obstructing, in the proximal right lower lobe pulmonary artery. There is incompletely obstructing pulmonary embolus along the superior aspect of the left main pulmonary artery with pulmonary embolus extending into left upper lobe pulmonary arteries. There are pulmonary emboli extending into right upper lobe pulmonary artery branches as well. The right ventricle to left ventricle diameter ratio is 0.8, not consistent with right heart strain. There is no thoracic aortic aneurysm or dissection. There are a few small foci of calcification in the proximal great vessels. There is calcification in the main pulmonary outflow tract. There is left ventricular hypertrophy. No pericardial effusion or thickening. Mediastinum/Nodes: Thyroid appears unremarkable. There are a few scattered subcentimeter lymph nodes in the mediastinum. There is no adenopathy by size criteria. No esophageal lesions are evident. Lungs/Pleura: There is airspace consolidation in portions of the superior and posterior segments of the left lower lobe. There is airspace consolidation in portions of the superior and lateral segments of the right lower lobe. There is a somewhat wedge-shaped area of opacity along the periphery of the left upper lobe near the junction of the anterior posterior segment, a likely infarct. There is no appreciable pleural effusion. Upper Abdomen: In the visualized upper abdomen, there is reflux of contrast into the inferior vena cava and hepatic veins. Visualized upper abdominal structures otherwise appear unremarkable. Musculoskeletal: There are no blastic or lytic bone lesions. Review of the MIP images confirms the above findings. IMPRESSION: 1. Pulmonary emboli in both upper lobe pulmonary arteries as well as an incompletely obstructing pulmonary embolus in the proximal right lower lobe pulmonary artery. There is incompletely  obstructing thrombus along the superior aspect of the distal portion of the left main pulmonary artery. No right heart strain evident. There is left ventricular hypertrophy. 2. There is airspace consolidation consistent with pneumonia in both lower lobes. Probable infarct in the right upper lobe near the junction of the anterior and posterior segments. 3.  Scattered subcentimeter lymph nodes without frank adenopathy. 4. Reflux of contrast into the inferior vena cava and hepatic veins is felt to be indicative of increase in right heart pressure. 5. Aortic atherosclerosis. No aneurysm or dissection evident in the thoracic aorta. Critical Value/emergent results were called by telephone at the time of interpretation on 06/13/2017 at 1:53 pm to  Dr. Wells Guiles LORD , who verbally acknowledged these results. Aortic Atherosclerosis (ICD10-I70.0). Electronically Signed   By: Lowella Grip III M.D.   On: 06/13/2017 13:53   Dg Chest Portable 1 View  Result Date: 06/13/2017 CLINICAL DATA:  Weakness and fatigue. EXAM: PORTABLE CHEST 1 VIEW COMPARISON:  01/22/2017 FINDINGS: Cardiomediastinal silhouette is normal. Mediastinal contours appear intact. Calcific atherosclerotic disease and tortuosity of the aorta. There has been interval development of right perihilar airspace opacity versus pulmonary mass. Mildly increased interstitial markings. Osseous structures are without acute abnormality. Soft tissues are grossly normal. IMPRESSION: Apparent right perihilar airspace consolidation versus pulmonary mass. Mild pulmonary vascular congestion. Electronically Signed   By: Fidela Salisbury M.D.   On: 06/13/2017 12:16    Review of Systems  Constitutional: Positive for chills and fever.  HENT: Negative for hearing loss.   Eyes: Negative for blurred vision.  Respiratory: Negative for cough.   Cardiovascular: Negative for chest pain.  Gastrointestinal: Negative for heartburn, nausea and vomiting.  Genitourinary: Negative  for dysuria.  Musculoskeletal: Negative for myalgias.  Skin: Negative for rash.  Neurological: Negative for dizziness.  Psychiatric/Behavioral: Positive for memory loss.    Blood pressure (!) 175/111, pulse 85, temperature 98.6 F (37 C), temperature source Oral, resp. rate (!) 33, height '6\' 3"'  (1.905 m), weight 78.9 kg (174 lb), SpO2 92 %. Physical Exam  Constitutional: He appears well-developed and well-nourished. No distress.  HENT:  Head: Normocephalic and atraumatic.  Mouth/Throat: Oropharynx is clear and moist. No oropharyngeal exudate.  Eyes: Conjunctivae are normal. Pupils are equal, round, and reactive to light. Left eye exhibits no discharge.  Neck: Neck supple. No JVD present. No thyromegaly present.  Cardiovascular: Normal rate and regular rhythm.  No murmur heard. Respiratory:  Mild scattered rhonchi. No use of accessory muscles.  GI: Soft. Bowel sounds are normal. He exhibits no distension and no mass. There is no rebound.  Musculoskeletal: He exhibits no edema or tenderness.  Lymphadenopathy:    He has no cervical adenopathy.  Neurological: He is alert.  Skin: Skin is warm and dry.     Assessment/Plan 1. Bilateral pulmonary emboli. CT scan shows bilateral upper lobe. This likely has some infarcting on one side. Likely has some right heart strain. He is not tachycardic or hypotensive. He looks fairly comfortable on 100% nonrebreather. We'll go ahead and start heparin. Does not appear to be a candidate for thrombolytics. 2. Acute respiratory failure. Continue oxygen support. Discussed with the wife about possibility of intubation. She says she wants everything done. 3. Pneumonia. CT scan did show some bilateral lower lobe infiltrates. We'll go ahead and cover him for ICU pneumonia with Zosyn and Levaquin. 4. Stage III chronic renal failure. Appears to be at baseline. We'll hold his ACE inhibitor for now. Renal dose medications if needed.   5. Seizure disorder. Continue  his current medications. 6. Elevated troponin. Likely represents some right heart strain. He is Re: On anticoagulation. 7. Dementia. Appears to be at baseline 8. CODE STATUS. Discussed with the wife. He is a full code.   Baxter Hire, MD 06/13/2017, 2:32 PM

## 2017-06-13 NOTE — ED Triage Notes (Addendum)
Pt BIB [facility] from home d/t weakness x3 days. Hx CVA with right sided weakness. Room air saturation 80 %. Afebrile. HTN. Reports recent cough.Marland Kitchen

## 2017-06-13 NOTE — Progress Notes (Signed)
ANTIBIOTIC CONSULT NOTE - INITIAL  Pharmacy Consult for Zosyn,  Levaquin  Indication: pneumonia  No Known Allergies  Patient Measurements: Height: 6\' 3"  (190.5 cm) Weight: 175 lb 4.3 oz (79.5 kg) IBW/kg (Calculated) : 84.5 Adjusted Body Weight:   Vital Signs: Temp: 98.5 F (36.9 C) (11/11 1610) Temp Source: Axillary (11/11 1610) BP: 163/115 (11/11 1610) Pulse Rate: 84 (11/11 1500) Intake/Output from previous day: No intake/output data recorded. Intake/Output from this shift: Total I/O In: 1000 [IV Piggyback:1000] Out: -   Labs: Recent Labs    06/13/17 1142  WBC 10.5  HGB 14.6  PLT 190  CREATININE 1.53*   Estimated Creatinine Clearance: 47.6 mL/min (A) (by C-G formula based on SCr of 1.53 mg/dL (H)). No results for input(s): VANCOTROUGH, VANCOPEAK, VANCORANDOM, GENTTROUGH, GENTPEAK, GENTRANDOM, TOBRATROUGH, TOBRAPEAK, TOBRARND, AMIKACINPEAK, AMIKACINTROU, AMIKACIN in the last 72 hours.   Microbiology: No results found for this or any previous visit (from the past 720 hour(s)).  Medical History: Past Medical History:  Diagnosis Date  . Arthritis   . Cancer [facility]) 05/2011   prostate cancer, [facility]  . GERD (gastroesophageal reflux disease)   . Hypertension   . Memory loss   . Seizures ([place])   . TIA (transient ischemic attack)     Medications:  Medications Prior to Admission  Medication Sig Dispense Refill Last Dose  . clopidogrel (PLAVIX) 75 MG tablet Take 1 tablet (75 mg total) by mouth daily. 30 tablet 2 06/13/2017 at Unknown time  . levETIRAcetam (KEPPRA) 500 MG tablet Take 2 tablets (1,000 mg total) by mouth 2 (two) times daily. (Patient taking differently: Take 2 (two) times daily by mouth. ) 60 tablet 12 06/13/2017 at Unknown time  . lisinopril (PRINIVIL,ZESTRIL) 20 MG tablet Take 20 mg by mouth daily.   06/13/2017 at Unknown time  . metoprolol succinate (TOPROL-XL) 100 MG 24 hr tablet Take 100 mg by mouth daily. Take with or immediately following a  meal.   06/13/2017 at Unknown time  . donepezil (ARICEPT) 10 MG tablet Take 1 tablet (10 mg total) by mouth at bedtime. (Patient not taking: Reported on 06/13/2017) 30 tablet 12 Not Taking at Unknown time  . memantine (NAMENDA) 5 MG tablet Take 1 tablet (5 mg total) by mouth 2 (two) times daily. (Patient not taking: Reported on 06/13/2017) 60 tablet 11 Not Taking at Unknown time  . QUEtiapine (SEROQUEL) 25 MG tablet Take 1 tablet (25 mg total) by mouth at bedtime. (Patient not taking: Reported on 06/13/2017) 30 tablet 2 Not Taking at Unknown time   Assessment: CrCl = 47.6 ml/min   Goal of Therapy:  resolution of infection  Plan:  Expected duration 7 days with resolution of temperature and/or normalization of WBC   Zosyn 3.375 gm IV Q8H EI ordered to start 11/11 @ 17:00.   Levaquin 500 mg IV X 1 ordered to start on 11/12 @ 10:00 (pt received azithromycin on 11/11) followed by levaquin 250 mg IV Q24H to start on 11/13 .   Deshanna Kama D 06/13/2017,4:30 PM

## 2017-06-13 NOTE — ED Notes (Signed)
MD Edwina Barth advised against PRN HTN medication.

## 2017-06-13 NOTE — ED Notes (Signed)
Family at bedside. 

## 2017-06-13 NOTE — ED Notes (Signed)
Transport by [employer], Therapist, sports

## 2017-06-13 NOTE — Consult Note (Signed)
PULMONARY / CRITICAL CARE MEDICINE   Name: Philip Moreno MRN: [id number] DOB: 04-Jul-1941    ADMISSION DATE:  06/13/2017   CONSULTATION DATE:  06/13/2017  REFERRING MD:  Dr. Edwina Barth  Reason: Pulmonary embolism, respiratory failure and pneumonia  CHIEF COMPLAINT:  Generalized weakness  HISTORY OF PRESENT ILLNESS:   This is a 76 year old male with a past medical history as indicated below presented to the ED with complaints of generalized weakness, cough that is nonproductive chills, and a fall at home. History is obtained from ED records, and from patient's wife as patient is still very somnolent. The patient's wife, symptoms started gradually and got worse over time. Upon EMS arrival, patient was hypoxic on room air with SPO2 in the 80s. At the ED, patient was found to have bilateral pulmonary emboli and bilateral lower lobe pneumonia. He is being admitted to the ICU for further management. He reports significant improvement in symptoms but remains weak with continuous hiccups. The hiccups are chronic  PAST MEDICAL HISTORY :  He  has a past medical history of Arthritis, Cancer ([place]) (05/2011), GERD (gastroesophageal reflux disease), Hypertension, Memory loss, Seizures ([place]), and TIA (transient ischemic attack).  PAST SURGICAL HISTORY: He  has a past surgical history that includes Prostate surgery (2012); COLONOSCOPY WITH PROPOFOL (N/A, 02/27/2015); ROBOTIC ASSISTED LAPAROSCOPIC RADICAL PROSTATECTOMY LEVEL 2 (N/A, 08/25/2012); and LYMPHADENECTOMY (Bilateral, 08/25/2012).  No Known Allergies  No current facility-administered medications on file prior to encounter.    Current Outpatient Medications on File Prior to Encounter  Medication Sig  . clopidogrel (PLAVIX) 75 MG tablet Take 1 tablet (75 mg total) by mouth daily.  Marland Kitchen levETIRAcetam (KEPPRA) 500 MG tablet Take 2 tablets (1,000 mg total) by mouth 2 (two) times daily. (Patient taking differently: Take 2 (two) times daily by mouth. )  .  lisinopril (PRINIVIL,ZESTRIL) 20 MG tablet Take 20 mg by mouth daily.  . metoprolol succinate (TOPROL-XL) 100 MG 24 hr tablet Take 100 mg by mouth daily. Take with or immediately following a meal.  . donepezil (ARICEPT) 10 MG tablet Take 1 tablet (10 mg total) by mouth at bedtime. (Patient not taking: Reported on 06/13/2017)  . memantine (NAMENDA) 5 MG tablet Take 1 tablet (5 mg total) by mouth 2 (two) times daily. (Patient not taking: Reported on 06/13/2017)  . QUEtiapine (SEROQUEL) 25 MG tablet Take 1 tablet (25 mg total) by mouth at bedtime. (Patient not taking: Reported on 06/13/2017)    FAMILY HISTORY:  His indicated that his mother is alive. He indicated that his father is deceased.   SOCIAL HISTORY: He  reports that  has never smoked. His smokeless tobacco use includes snuff and chew. He reports that he does not drink alcohol or use drugs.  REVIEW OF SYSTEMS:   Constitutional: Positive for generalized weakness and chills  HENT: Negative for congestion and rhinorrhea.  Eyes: Negative for redness and visual disturbance.  Respiratory: Negative for shortness of breath and wheezing . Positive for a nonproductive cough Cardiovascular: Negative for chest pain and palpitations.  Gastrointestinal: Negative  for nausea , vomiting and abdominal pain and  Loose stools Genitourinary: Negative for dysuria and urgency.  Endocrine: Denies polyuria, polyphagia and heat intolerance Musculoskeletal: Bed confined, reports generalized weakness Skin: Negative for pallor and wound.  Neurological: Negative for dizziness and headaches   SUBJECTIVE:  VITAL SIGNS: BP (!) 165/107   Pulse 80   Temp 98.5 F (36.9 C) (Axillary)   Resp (!) 25   Ht 6\' 3"  (  1.905 m)   Wt 175 lb 4.3 oz (79.5 kg)   SpO2 95%   BMI 21.91 kg/m   HEMODYNAMICS:    VENTILATOR SETTINGS:    INTAKE / OUTPUT: I/O last 3 completed shifts: In: 1000 [IV Piggyback:1000] Out: -   PHYSICAL EXAMINATION: General: Appears  chronically ill Neuro:  Awake, follows some basic commands, significantly decreased strength in upper and lower extremities, hemiparesis HEENT:  PERRLA, trachea midline, neck is supple, no JVD Cardiovascular:  Apical pulse regular, S1, S2, no murmur, regurg, +2 pulses, no edema Lungs:  Increased work of breathing, bilateral. Sounds, diffuse rhonchi in anterior lung fields, diminished breath sounds in the bases, bilateral adrenals and bilateral lower lobes Abdomen:  Nondistended, soft, positive bowel sounds in all 4 quadrants Musculoskeletal:  Positive range of motion, no joint swelling Skin: Dry  LABS:  BMET Recent Labs  Lab 06/13/17 1142  NA 139  K 3.0*  CL 105  CO2 22  BUN 22*  CREATININE 1.53*  GLUCOSE 122*    Electrolytes Recent Labs  Lab 06/13/17 1142  CALCIUM 9.1    CBC Recent Labs  Lab 06/13/17 1142  WBC 10.5  HGB 14.6  HCT 45.5  PLT 190    Coag's Recent Labs  Lab 06/13/17 1142  APTT 33  INR 1.16    Sepsis Markers Recent Labs  Lab 06/13/17 1338  LATICACIDVEN 1.7    ABG No results for input(s): PHART, PCO2ART, PO2ART in the last 168 hours.  Liver Enzymes Recent Labs  Lab 06/13/17 1142  AST 25  ALT 13*  ALKPHOS 44  BILITOT 1.2  ALBUMIN 3.7    Cardiac Enzymes Recent Labs  Lab 06/13/17 1142 06/13/17 1752  TROPONINI 0.09* 0.08*    Glucose Recent Labs  Lab 06/13/17 1617  GLUCAP 114*    Imaging Ct Angio Chest Pe W/cm &/or Wo Cm  Result Date: 06/13/2017 CLINICAL DATA:  Shortness of breath with decreased oxygen saturation EXAM: CT ANGIOGRAPHY CHEST WITH CONTRAST TECHNIQUE: Multidetector CT imaging of the chest was performed using the standard protocol during bolus administration of intravenous contrast. Multiplanar CT image reconstructions and MIPs were obtained to evaluate the vascular anatomy. CONTRAST:  67mL ISOVUE-370 IOPAMIDOL (ISOVUE-370) INJECTION 76% COMPARISON:  Chest CT January 09, 2016 and chest radiograph June 13, 2017  FINDINGS: Cardiovascular: There is a pulmonary embolus, incompletely obstructing, in the proximal right lower lobe pulmonary artery. There is incompletely obstructing pulmonary embolus along the superior aspect of the left main pulmonary artery with pulmonary embolus extending into left upper lobe pulmonary arteries. There are pulmonary emboli extending into right upper lobe pulmonary artery branches as well. The right ventricle to left ventricle diameter ratio is 0.8, not consistent with right heart strain. There is no thoracic aortic aneurysm or dissection. There are a few small foci of calcification in the proximal great vessels. There is calcification in the main pulmonary outflow tract. There is left ventricular hypertrophy. No pericardial effusion or thickening. Mediastinum/Nodes: Thyroid appears unremarkable. There are a few scattered subcentimeter lymph nodes in the mediastinum. There is no adenopathy by size criteria. No esophageal lesions are evident. Lungs/Pleura: There is airspace consolidation in portions of the superior and posterior segments of the left lower lobe. There is airspace consolidation in portions of the superior and lateral segments of the right lower lobe. There is a somewhat wedge-shaped area of opacity along the periphery of the left upper lobe near the junction of the anterior posterior segment, a likely infarct. There is no appreciable  pleural effusion. Upper Abdomen: In the visualized upper abdomen, there is reflux of contrast into the inferior vena cava and hepatic veins. Visualized upper abdominal structures otherwise appear unremarkable. Musculoskeletal: There are no blastic or lytic bone lesions. Review of the MIP images confirms the above findings. IMPRESSION: 1. Pulmonary emboli in both upper lobe pulmonary arteries as well as an incompletely obstructing pulmonary embolus in the proximal right lower lobe pulmonary artery. There is incompletely obstructing thrombus along the  superior aspect of the distal portion of the left main pulmonary artery. No right heart strain evident. There is left ventricular hypertrophy. 2. There is airspace consolidation consistent with pneumonia in both lower lobes. Probable infarct in the right upper lobe near the junction of the anterior and posterior segments. 3.  Scattered subcentimeter lymph nodes without frank adenopathy. 4. Reflux of contrast into the inferior vena cava and hepatic veins is felt to be indicative of increase in right heart pressure. 5. Aortic atherosclerosis. No aneurysm or dissection evident in the thoracic aorta. Critical Value/emergent results were called by telephone at the time of interpretation on 06/13/2017 at 1:53 pm to Dr. Lisa Roca , who verbally acknowledged these results. Aortic Atherosclerosis (ICD10-I70.0). Electronically Signed   By: Lowella Grip III M.D.   On: 06/13/2017 13:53   Dg Chest Portable 1 View  Result Date: 06/13/2017 CLINICAL DATA:  Weakness and fatigue. EXAM: PORTABLE CHEST 1 VIEW COMPARISON:  01/22/2017 FINDINGS: Cardiomediastinal silhouette is normal. Mediastinal contours appear intact. Calcific atherosclerotic disease and tortuosity of the aorta. There has been interval development of right perihilar airspace opacity versus pulmonary mass. Mildly increased interstitial markings. Osseous structures are without acute abnormality. Soft tissues are grossly normal. IMPRESSION: Apparent right perihilar airspace consolidation versus pulmonary mass. Mild pulmonary vascular congestion. Electronically Signed   By: Fidela Salisbury M.D.   On: 06/13/2017 12:16    STUDIES:  None  CULTURES: Blood cultures 2  ANTIBIOTICS: Levofloxacin SIGNIFICANT EVENTS: 06/13/2017: Admitted  LINES/TUBES: Peripheral IVs  DISCUSSION: 76 year old male presenting with community-acquired pneumonia, bilateral pulmonary embolism and acute hypoxic respiratory failure  ASSESSMENT  Acute hypoxic respiratory  failure Acute bilateral pulmonary emboli. Community-acquired pneumonia Generalized weakness Hypokalemia. Acute renal failure History of seizures, hypertension, prostate cancer, and GERD  PLAN Hemodynamic monitoring per ICU protocol. Continuous BiPAP and titrate to high flow nasal cannula as tolerated Antibiotics as above Trend pro-calcitonin IV fluids Heparin infusion per protocol Keep SPO2 greater than 90 Strict I and O's monitoring Resume all home medications GI and DVT prophylaxis   FAMILY  - Updates: No family at bedside. We'll updated when available - Inter-disciplinary family meet or Palliative Care meeting due by: day [place]. [facility] ANP-BC Pulmonary and Critical Care Medicine [facility] Pager [phone] or [phone]  06/13/2017, 8:02 PM

## 2017-06-13 NOTE — ED Notes (Signed)
Test: TROP Critical Value: 0.09  Name of Provider Notified: LORD

## 2017-06-13 NOTE — Progress Notes (Signed)
ANTICOAGULATION CONSULT NOTE - Initial Consult  Pharmacy Consult for Heparin  Indication: pulmonary embolus  No Known Allergies  Patient Measurements: Height: 6\' 3"  (190.5 cm) Weight: 174 lb (78.9 kg) IBW/kg (Calculated) : 84.5 Heparin Dosing Weight: 78.9 kg   Vital Signs: Temp: 98.6 F (37 C) (11/11 1140) Temp Source: Oral (11/11 1140) BP: 170/119 (11/11 1140) Pulse Rate: 84 (11/11 1315)  Labs: Recent Labs    06/13/17 1142  HGB 14.6  HCT 45.5  PLT 190  CREATININE 1.53*  TROPONINI 0.09*    Estimated Creatinine Clearance: 47.3 mL/min (A) (by C-G formula based on SCr of 1.53 mg/dL (H)).   Medical History: Past Medical History:  Diagnosis Date  . Arthritis   . Cancer [facility]) 05/2011   prostate cancer, [facility]  . GERD (gastroesophageal reflux disease)   . Hypertension   . Memory loss   . Seizures ([place])   . TIA (transient ischemic attack)     Medications:   (Not in a hospital admission)  Assessment: Pharmacy consulted to dose heparin in this 76 year old male with P.E.  No prior anticoag noted. CrCl = 47.3 ml/min  Goal of Therapy:  Heparin level 0.3-0.7 units/ml Monitor platelets by anticoagulation protocol: Yes   Plan:  Give 4700 units bolus x 1 Start heparin infusion at 1300 units/hr Check anti-Xa level in 8 hours and daily while on heparin Continue to monitor H&H and platelets  Lindora Alviar D 06/13/2017,2:13 PM

## 2017-06-13 NOTE — Progress Notes (Addendum)
1610 late note. Admitted to ICU from ED with bilateral pulmonary emboli.. 100% nonrebreather mask on. Heparin infusing at 1300 units per hours. Normal Saline started at 100 mls /hour per orders. Confused. Does not know date,month or time. Only knows self and wife, address and birthday. Wife states this is baseline but also changes from day to day. He has a caregiver that comes in during the day. He is total care. Normally incontient of bowel and bladder.1800 States he is hungry. Respiratory called to change O2 to mask or high flow. Unable to tolerate nasal canulla at 6 liters and keep oxygen saturations greater than 92%. Fed dinner per wife - no difficulty noted.

## 2017-06-14 ENCOUNTER — Other Ambulatory Visit: Payer: Self-pay

## 2017-06-14 DIAGNOSIS — J9601 Acute respiratory failure with hypoxia: Secondary | ICD-10-CM

## 2017-06-14 LAB — COMPREHENSIVE METABOLIC PANEL
ALT: 12 U/L — ABNORMAL LOW (ref 17–63)
AST: 20 U/L (ref 15–41)
Albumin: 2.8 g/dL — ABNORMAL LOW (ref 3.5–5.0)
Alkaline Phosphatase: 30 U/L — ABNORMAL LOW (ref 38–126)
Anion gap: 8 (ref 5–15)
BILIRUBIN TOTAL: 0.9 mg/dL (ref 0.3–1.2)
BUN: 19 mg/dL (ref 6–20)
CHLORIDE: 107 mmol/L (ref 101–111)
CO2: 21 mmol/L — ABNORMAL LOW (ref 22–32)
Calcium: 7.7 mg/dL — ABNORMAL LOW (ref 8.9–10.3)
Creatinine, Ser: 1.42 mg/dL — ABNORMAL HIGH (ref 0.61–1.24)
GFR, EST AFRICAN AMERICAN: 55 mL/min — AB (ref >60)
GFR, EST NON AFRICAN AMERICAN: 47 mL/min — AB (ref >60)
Glucose, Bld: 101 mg/dL — ABNORMAL HIGH (ref 65–99)
POTASSIUM: 2.8 mmol/L — AB (ref 3.5–5.1)
Sodium: 136 mmol/L (ref 135–145)
TOTAL PROTEIN: 6.4 g/dL — AB (ref 6.5–8.1)

## 2017-06-14 LAB — BASIC METABOLIC PANEL
Anion gap: 8 (ref 5–15)
BUN: 18 mg/dL (ref 6–20)
CHLORIDE: 106 mmol/L (ref 101–111)
CO2: 22 mmol/L (ref 22–32)
CREATININE: 1.4 mg/dL — AB (ref 0.61–1.24)
Calcium: 8 mg/dL — ABNORMAL LOW (ref 8.9–10.3)
GFR calc Af Amer: 56 mL/min — ABNORMAL LOW (ref >60)
GFR calc non Af Amer: 48 mL/min — ABNORMAL LOW (ref >60)
Glucose, Bld: 97 mg/dL (ref 65–99)
Potassium: 3.9 mmol/L (ref 3.5–5.1)
SODIUM: 136 mmol/L (ref 135–145)

## 2017-06-14 LAB — PHOSPHORUS: PHOSPHORUS: 3.7 mg/dL (ref 2.5–4.6)

## 2017-06-14 LAB — CBC
HEMATOCRIT: 37.2 % — AB (ref 40.0–52.0)
Hemoglobin: 12.1 g/dL — ABNORMAL LOW (ref 13.0–18.0)
MCH: 25.8 pg — ABNORMAL LOW (ref 26.0–34.0)
MCHC: 32.5 g/dL (ref 32.0–36.0)
MCV: 79.3 fL — AB (ref 80.0–100.0)
PLATELETS: 164 10*3/uL (ref 150–440)
RBC: 4.69 MIL/uL (ref 4.40–5.90)
RDW: 15.4 % — AB (ref 11.5–14.5)
WBC: 8.2 10*3/uL (ref 3.8–10.6)

## 2017-06-14 LAB — PROCALCITONIN

## 2017-06-14 LAB — TROPONIN I: TROPONIN I: 0.07 ng/mL — AB (ref <0.03)

## 2017-06-14 LAB — HEPARIN LEVEL (UNFRACTIONATED)
HEPARIN UNFRACTIONATED: 0.29 [IU]/mL — AB (ref 0.30–0.70)
Heparin Unfractionated: 0.34 IU/mL (ref 0.30–0.70)

## 2017-06-14 LAB — MAGNESIUM: Magnesium: 1.8 mg/dL (ref 1.7–2.4)

## 2017-06-14 MED ORDER — IPRATROPIUM-ALBUTEROL 0.5-2.5 (3) MG/3ML IN SOLN
3.0000 mL | RESPIRATORY_TRACT | Status: DC | PRN
Start: 2017-06-14 — End: 2017-06-17

## 2017-06-14 MED ORDER — APIXABAN 5 MG PO TABS
10.0000 mg | ORAL_TABLET | Freq: Two times a day (BID) | ORAL | Status: DC
  Administered 2017-06-14 – 2017-06-17 (×7): 10 mg via ORAL
  Filled 2017-06-14 (×7): qty 2

## 2017-06-14 MED ORDER — DEXTROSE 5 % IV SOLN
1.0000 g | Freq: Every day | INTRAVENOUS | Status: DC
  Administered 2017-06-14: 1 g via INTRAVENOUS
  Filled 2017-06-14 (×2): qty 10

## 2017-06-14 MED ORDER — BUDESONIDE 0.25 MG/2ML IN SUSP
0.2500 mg | Freq: Two times a day (BID) | RESPIRATORY_TRACT | Status: DC
  Administered 2017-06-14: 0.25 mg via RESPIRATORY_TRACT
  Filled 2017-06-14: qty 2

## 2017-06-14 MED ORDER — POTASSIUM CHLORIDE 20 MEQ PO PACK
60.0000 meq | PACK | Freq: Once | ORAL | Status: AC
  Administered 2017-06-14: 60 meq via ORAL
  Filled 2017-06-14: qty 3

## 2017-06-14 MED ORDER — DEXTROSE 5 % IV SOLN
1.0000 g | INTRAVENOUS | Status: DC
  Filled 2017-06-14: qty 10

## 2017-06-14 MED ORDER — APIXABAN 5 MG PO TABS: 5.0000 mg | ORAL_TABLET | Freq: Two times a day (BID) | ORAL | Status: DC

## 2017-06-14 MED ORDER — AZITHROMYCIN 250 MG PO TABS: 250.0000 mg | ORAL_TABLET | Freq: Every day | ORAL | Status: DC

## 2017-06-14 MED ORDER — IPRATROPIUM-ALBUTEROL 0.5-2.5 (3) MG/3ML IN SOLN
3.0000 mL | RESPIRATORY_TRACT | Status: DC
  Administered 2017-06-14: 3 mL via RESPIRATORY_TRACT
  Filled 2017-06-14: qty 3

## 2017-06-14 MED ORDER — HEPARIN BOLUS VIA INFUSION
1200.0000 [IU] | Freq: Once | INTRAVENOUS | Status: AC
  Administered 2017-06-14: 1200 [IU] via INTRAVENOUS
  Filled 2017-06-14: qty 1200

## 2017-06-14 MED ORDER — AZITHROMYCIN 500 MG IV SOLR
500.0000 mg | INTRAVENOUS | Status: DC
  Administered 2017-06-14: 500 mg via INTRAVENOUS
  Filled 2017-06-14 (×2): qty 500

## 2017-06-14 NOTE — Progress Notes (Signed)
[place] at [place] NAME: Philip Moreno    MR#:  [id number]  DATE OF BIRTH:  17-Oct-1940  SUBJECTIVE:  Poor historian. Spoke with wife Pt has been sedentary at home. came in with SOB, fever and fell at home  REVIEW OF SYSTEMS:   Review of Systems  Unable to perform ROS: Dementia   Tolerating Diet:yes Tolerating PT: pending  DRUG ALLERGIES:  No Known Allergies  VITALS:  Blood pressure (!) 139/97, pulse 75, temperature 98.6 F (37 C), temperature source Oral, resp. rate (!) 24, height 6\' 3"  (1.905 m), weight 79.5 kg (175 lb 4.3 oz), SpO2 94 %.  PHYSICAL EXAMINATION:   Physical Exam  GENERAL:  76 y.o.-year-old patient lying in the bed with no acute distress.  EYES: Pupils equal, round, reactive to light and accommodation. No scleral icterus. Extraocular muscles intact.  HEENT: Head atraumatic, normocephalic. Oropharynx and nasopharynx clear.  NECK:  Supple, no jugular venous distention. No thyroid enlargement, no tenderness.  LUNGS: Normal breath sounds bilaterally, no wheezing, rales, rhonchi. No use of accessory muscles of respiration.  CARDIOVASCULAR: S1, S2 normal. No murmurs, rubs, or gallops. Mild tachy ABDOMEN: Soft, nontender, nondistended. Bowel sounds present. No organomegaly or mass.  EXTREMITIES: No cyanosis, clubbing or edema b/l.    NEUROLOGIC: moves all extremities well PSYCHIATRIC:  patient is alert  SKIN: No obvious rash, lesion, or ulcer.   LABORATORY PANEL:  CBC Recent Labs  Lab 06/14/17 0359  WBC 8.2  HGB 12.1*  HCT 37.2*  PLT 164    Chemistries  Recent Labs  Lab 06/14/17 0359 06/14/17 1642  NA 136 136  K 2.8* 3.9  CL 107 106  CO2 21* 22  GLUCOSE 101* 97  BUN 19 18  CREATININE 1.42* 1.40*  CALCIUM 7.7* 8.0*  MG 1.8  --   AST 20  --   ALT 12*  --   ALKPHOS 30*  --   BILITOT 0.9  --    Cardiac Enzymes Recent Labs  Lab 06/14/17 0359  TROPONINI 0.07*   RADIOLOGY:  Ct Angio Chest  Pe W/cm &/or Wo Cm  Result Date: 06/13/2017 CLINICAL DATA:  Shortness of breath with decreased oxygen saturation EXAM: CT ANGIOGRAPHY CHEST WITH CONTRAST TECHNIQUE: Multidetector CT imaging of the chest was performed using the standard protocol during bolus administration of intravenous contrast. Multiplanar CT image reconstructions and MIPs were obtained to evaluate the vascular anatomy. CONTRAST:  35mL ISOVUE-370 IOPAMIDOL (ISOVUE-370) INJECTION 76% COMPARISON:  Chest CT January 09, 2016 and chest radiograph June 13, 2017 FINDINGS: Cardiovascular: There is a pulmonary embolus, incompletely obstructing, in the proximal right lower lobe pulmonary artery. There is incompletely obstructing pulmonary embolus along the superior aspect of the left main pulmonary artery with pulmonary embolus extending into left upper lobe pulmonary arteries. There are pulmonary emboli extending into right upper lobe pulmonary artery branches as well. The right ventricle to left ventricle diameter ratio is 0.8, not consistent with right heart strain. There is no thoracic aortic aneurysm or dissection. There are a few small foci of calcification in the proximal great vessels. There is calcification in the main pulmonary outflow tract. There is left ventricular hypertrophy. No pericardial effusion or thickening. Mediastinum/Nodes: Thyroid appears unremarkable. There are a few scattered subcentimeter lymph nodes in the mediastinum. There is no adenopathy by size criteria. No esophageal lesions are evident. Lungs/Pleura: There is airspace consolidation in portions of the superior and posterior segments of the left lower lobe. There  is airspace consolidation in portions of the superior and lateral segments of the right lower lobe. There is a somewhat wedge-shaped area of opacity along the periphery of the left upper lobe near the junction of the anterior posterior segment, a likely infarct. There is no appreciable pleural effusion. Upper  Abdomen: In the visualized upper abdomen, there is reflux of contrast into the inferior vena cava and hepatic veins. Visualized upper abdominal structures otherwise appear unremarkable. Musculoskeletal: There are no blastic or lytic bone lesions. Review of the MIP images confirms the above findings. IMPRESSION: 1. Pulmonary emboli in both upper lobe pulmonary arteries as well as an incompletely obstructing pulmonary embolus in the proximal right lower lobe pulmonary artery. There is incompletely obstructing thrombus along the superior aspect of the distal portion of the left main pulmonary artery. No right heart strain evident. There is left ventricular hypertrophy. 2. There is airspace consolidation consistent with pneumonia in both lower lobes. Probable infarct in the right upper lobe near the junction of the anterior and posterior segments. 3.  Scattered subcentimeter lymph nodes without frank adenopathy. 4. Reflux of contrast into the inferior vena cava and hepatic veins is felt to be indicative of increase in right heart pressure. 5. Aortic atherosclerosis. No aneurysm or dissection evident in the thoracic aorta. Critical Value/emergent results were called by telephone at the time of interpretation on 06/13/2017 at 1:53 pm to Dr. Lisa Roca , who verbally acknowledged these results. Aortic Atherosclerosis (ICD10-I70.0). Electronically Signed   By: Lowella Grip III M.D.   On: 06/13/2017 13:53   Dg Chest Portable 1 View  Result Date: 06/13/2017 CLINICAL DATA:  Weakness and fatigue. EXAM: PORTABLE CHEST 1 VIEW COMPARISON:  01/22/2017 FINDINGS: Cardiomediastinal silhouette is normal. Mediastinal contours appear intact. Calcific atherosclerotic disease and tortuosity of the aorta. There has been interval development of right perihilar airspace opacity versus pulmonary mass. Mildly increased interstitial markings. Osseous structures are without acute abnormality. Soft tissues are grossly normal.  IMPRESSION: Apparent right perihilar airspace consolidation versus pulmonary mass. Mild pulmonary vascular congestion. Electronically Signed   By: Fidela Salisbury M.D.   On: 06/13/2017 12:16   ASSESSMENT AND PLAN:  76 year old male with a past medical history as indicated below presented to the ED with complaints of generalized weakness, cough that is nonproductive chills, and a fall at home. History is obtained from ED records, and from patient's wife as patient is still very somnolent. The patient's wife, symptoms started gradually and got worse over time. Upon EMS arrival, patient was hypoxic on room air with SPO2 in the 80s. At the ED, patient was found to have bilateral pulmonary emboli and bilateral lower lobe pneumonia.    1. Bilateral pulmonary emboli.  -CT scan shows bilateral upper lobe. This likely has some infarcting on one side. Likely has some right heart strain. He is not tachycardic or hypotensive.  - Does not appear to be a candidate for thrombolytics. -IV heparin gtt ---now on po eliquis  2. Acute hypoxic respiratory failure due to PE/ Pulm infarct and ?pneumonia (no fever, procalcitonin normal and wbc normal) -Continue oxygen support.  -follow clinically  3. Stage III chronic renal failure. Appears to be at baseline. We'll hold his ACE inhibitor for now. Renal dose medications if needed.   4. Seizure disorder. Continue his current medications.  5. Elevated troponin. Likely represents some right heart strain. He is Re: On anticoagulation.  6 Dementia. Appears to be at baseline  PT to be initiated after 48  hours  7. CODE STATUS. Discussed with the wife. He is a full code.  D/w wife in the room  Case discussed with Care Management/Social Worker. Management plans discussed with the patient, family and they are in agreement.  CODE STATUS: full  DVT Prophylaxis: eliquis  TOTAL TIME TAKING CARE OF THIS PATIENT: 30 minutes.  >50% time spent on counselling and  coordination of care  POSSIBLE D/C IN **2-3DAYS, DEPENDING ON CLINICAL CONDITION.  Note: This dictation was prepared with Dragon dictation along with smaller phrase technology. Any transcriptional errors that result from this process are unintentional.  Seyon Strader M.D on 06/14/2017 at 5:30 PM  Between 7am to 6pm - Pager - [phone]  After 6pm go to [email or web address] - password [email or web address] [facility]  Office  [phone]  CC: Primary care physician; [facility]

## 2017-06-14 NOTE — Progress Notes (Signed)
ANTICOAGULATION CONSULT NOTE - Initial Consult  Pharmacy Consult for Heparin  Indication: pulmonary embolus  No Known Allergies  Patient Measurements: Height: 6\' 3"  (190.5 cm) Weight: 175 lb 4.3 oz (79.5 kg) IBW/kg (Calculated) : 84.5 Heparin Dosing Weight: 78.9 kg   Vital Signs: Temp: 99.8 F (37.7 C) (11/12 0200) Temp Source: Axillary (11/12 0200) BP: 103/75 (11/12 0300) Pulse Rate: 71 (11/12 0300)  Labs: Recent Labs    06/13/17 1142 06/13/17 1752 06/13/17 2148 06/14/17 0359  HGB 14.6  --   --  12.1*  HCT 45.5  --   --  37.2*  PLT 190  --   --  164  APTT 33  --   --   --   LABPROT 14.7  --   --   --   INR 1.16  --   --   --   HEPARINUNFRC  --   --  0.32 0.29*  CREATININE 1.53*  --   --  1.42*  TROPONINI 0.09* 0.08* 0.09* 0.07*    Estimated Creatinine Clearance: 51.3 mL/min (A) (by C-G formula based on SCr of 1.42 mg/dL (H)).   Medical History: Past Medical History:  Diagnosis Date  . Arthritis   . Cancer [facility]) 05/2011   prostate cancer, [facility]  . GERD (gastroesophageal reflux disease)   . Hypertension   . Memory loss   . Seizures ([place])   . TIA (transient ischemic attack)     Medications:  Medications Prior to Admission  Medication Sig Dispense Refill Last Dose  . clopidogrel (PLAVIX) 75 MG tablet Take 1 tablet (75 mg total) by mouth daily. 30 tablet 2 06/13/2017 at Unknown time  . levETIRAcetam (KEPPRA) 500 MG tablet Take 2 tablets (1,000 mg total) by mouth 2 (two) times daily. (Patient taking differently: Take 2 (two) times daily by mouth. ) 60 tablet 12 06/13/2017 at Unknown time  . lisinopril (PRINIVIL,ZESTRIL) 20 MG tablet Take 20 mg by mouth daily.   06/13/2017 at Unknown time  . metoprolol succinate (TOPROL-XL) 100 MG 24 hr tablet Take 100 mg by mouth daily. Take with or immediately following a meal.   06/13/2017 at Unknown time  . donepezil (ARICEPT) 10 MG tablet Take 1 tablet (10 mg total) by mouth at bedtime. (Patient not taking: Reported  on 06/13/2017) 30 tablet 12 Not Taking at Unknown time  . memantine (NAMENDA) 5 MG tablet Take 1 tablet (5 mg total) by mouth 2 (two) times daily. (Patient not taking: Reported on 06/13/2017) 60 tablet 11 Not Taking at Unknown time  . QUEtiapine (SEROQUEL) 25 MG tablet Take 1 tablet (25 mg total) by mouth at bedtime. (Patient not taking: Reported on 06/13/2017) 30 tablet 2 Not Taking at Unknown time    Assessment: Pharmacy consulted to dose heparin in this 76 year old male with P.E.  No prior anticoag noted. CrCl = 47.3 ml/min  Goal of Therapy:  Heparin level 0.3-0.7 units/ml Monitor platelets by anticoagulation protocol: Yes   Plan:  Give 4700 units bolus x 1 Start heparin infusion at 1300 units/hr Check anti-Xa level in 8 hours and daily while on heparin Continue to monitor H&H and platelets   11/12 @ 0400 HL 0.29 subtherapeutic. Will rebolus w/ heparin 1200 units IV x 1 and will increase rate to 1400 units/hr and will recheck HL @ 1100. Will monitor CBC w/ am labs.  Tobie Lords, PharmD, BCPS Clinical Pharmacist 06/14/2017

## 2017-06-14 NOTE — Progress Notes (Signed)
Patient tolerating HFNC without difficulty.  Continues on heparin drip.  Incontinent of urine. Wife remains at bedside.  No discomfort noted.

## 2017-06-14 NOTE — Progress Notes (Signed)
Changed patient to Philip Moreno, will continue to monitor, sats are 95% at this time.

## 2017-06-14 NOTE — Progress Notes (Signed)
Report called to Beth,RN. Pt being transported to room 208 in stable condition. All belongings with patient. Family at bedside.

## 2017-06-14 NOTE — Progress Notes (Signed)
Pharmacy Antibiotic Note/Anticoagulation Note  Philip Moreno is a 76 y.o. male admitted on 06/13/2017 with pneumonia and PE.  Pharmacy has been consulted for ceftriaxone and Eliquis dosing (transition from heparin drip). Patient is also ordered azithromycin.   Plan: 1. Ceftriaxone 1 g iv q 24 hours. Will discuss need for continued abx with CCM.   2. Eliquis 10 mg bid x 7 days then 5 mg bid thereafter.   Height: 6\' 3"  (190.5 cm) Weight: 175 lb 4.3 oz (79.5 kg) IBW/kg (Calculated) : 84.5  Temp (24hrs), Avg:99.3 F (37.4 C), Min:98.5 F (36.9 C), Max:100.1 F (37.8 C)  Recent Labs  Lab 06/13/17 1142 06/13/17 1338 06/14/17 0359  WBC 10.5  --  8.2  CREATININE 1.53*  --  1.42*  LATICACIDVEN  --  1.7  --     Estimated Creatinine Clearance: 51.3 mL/min (A) (by C-G formula based on SCr of 1.42 mg/dL (H)).    No Known Allergies  Antimicrobials this admission: Zosyn 11/11 >> 11/12 Azithromycin 11/12 >>  Ceftriaxone 11/12 >>   Dose adjustments this admission:   Microbiology results: 11/11 BCx: NGTD 11/11 MRSA PCR: negative  Thank you for allowing pharmacy to be a part of this patient's care.  Ulice Dash D 06/14/2017 3:11 PM

## 2017-06-14 NOTE — Progress Notes (Signed)
Decreased to 65% fio2

## 2017-06-14 NOTE — Progress Notes (Signed)
PT Cancellation Note  Patient Details Name: Philip Moreno MRN: [id number] DOB: 1941-05-29   Cancelled Treatment:    Reason Eval/Treat Not Completed: Medical issues which prohibited therapy(Consult received and chart reviewed. Patient noted with bilat PE, bilat LL PNA; anticoag initiated 1425, 06/13/17. Per policy, will require anti-coag x48 hours prior to initiation of exertional activity.  Will plan to initiate evaluation after 1425, 06/15/17.)   [name] H. Owens Shark, PT, DPT, NCS 06/14/17, 1:49 PM [phone]

## 2017-06-15 LAB — PROCALCITONIN: Procalcitonin: <0.1 ng/mL

## 2017-06-15 MED ORDER — ENSURE ENLIVE PO LIQD
237.0000 mL | Freq: Three times a day (TID) | ORAL | Status: DC
  Administered 2017-06-15 – 2017-06-16 (×4): 237 mL via ORAL

## 2017-06-15 MED ORDER — LISINOPRIL 20 MG PO TABS
20.0000 mg | ORAL_TABLET | Freq: Every day | ORAL | Status: DC
  Administered 2017-06-15 – 2017-06-17 (×3): 20 mg via ORAL
  Filled 2017-06-15 (×3): qty 1

## 2017-06-15 MED ORDER — SENNA 8.6 MG PO TABS
2.0000 | ORAL_TABLET | Freq: Every day | ORAL | Status: DC
  Administered 2017-06-15 – 2017-06-16 (×2): 17.2 mg via ORAL
  Filled 2017-06-15 (×2): qty 2

## 2017-06-15 MED ORDER — ADULT MULTIVITAMIN LIQUID CH
15.0000 mL | Freq: Every day | ORAL | Status: DC
  Administered 2017-06-15 – 2017-06-17 (×3): 15 mL via ORAL
  Filled 2017-06-15 (×3): qty 15

## 2017-06-15 NOTE — Progress Notes (Signed)
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  GOAL:   Patient will meet greater than or equal to 90% of their needs  MONITOR:   PO intake, Supplement acceptance, Labs, Weight trends, I & O's  REASON FOR ASSESSMENT:   Consult Assessment of nutrition requirement/status  ASSESSMENT:   76 year old male with h/o CKD III, dementia, CVA, GERD, seizures, prostate cancer, presents with community-acquired pneumonia, bilateral pulmonary embolism and acute hypoxic respiratory failure   Met with pt and pt's wife in room today. Unable to talk with pt as pt with dementia so history obtained from pt's wife at bedside. Pt's wife reports pt with poor appetite and oral intake for the past several months. Per chart, pt has lost 23lbs(11%) in 5 months; this is severe. Wife reports that pt does drink Ensure at home; she is requesting Ensure and Magic Cups for here. Pt ate 90% of his breakfast this morning which included toast, OJ, a sausage patty, and corn flakes with milk. Per wife report, pt with h/o iron and B12 deficiency. Wife reports that pt had to have B12 injections in the past which improved his appetite for a while, but then it decreased back to baseline. Pt has not had these labs checked in a while. Wife also reports that  pt was on an appetite stimulant at home but that this did not increase his appetite. Recommend check iron/anemia/B12 labs.   Medications reviewed and include: azithromycin, ceftriaxone  Labs reviewed:  Nutrition-Focused physical exam completed. Findings are mild fat depletion in buccal regions, moderate fat depletion in chest, moderate muscle depletion in temporal regions, clavicles, thighs, and calfs, severe muscle depletions in hands and knees, and no edema.   Diet Order:  Diet regular Room service appropriate? Yes; Fluid consistency: Thin  EDUCATION NEEDS:   Not appropriate for education at this time  Skin:  Reviewed RN Assessment  Last BM:  11/10  Height:   Ht Readings from Last 1 Encounters:  06/13/17 _0  (1.905 m)    Weight:   Wt Readings from Last 1 Encounters:  06/15/17 177 lb 1.6 oz (80.3 kg)    Ideal Body Weight:  89 kg  BMI:  Body mass index is 22.14 kg/m.  Estimated Nutritional Needs:   Kcal:  2000-2300kcal/day   Protein:  97-113g/day   Fluid:  >1.8L/day   Koleen Distance MS, RD, LDN Pager #[phone] After Hours Pager: [phone]

## 2017-06-15 NOTE — Progress Notes (Signed)
MD paged at [place] of pt's temp of 102.3. Waiting a return page. PO Tylenol was given. Blankets removed from patient. This was passed off to the nightshift RN and NT.

## 2017-06-15 NOTE — Progress Notes (Signed)
[place] at [place] NAME: Philip Moreno    MR#:  [id number]  DATE OF BIRTH:  1941-03-08  SUBJECTIVE:   Patient here due to bilateral pulmonary embolism. Tolerating anticoagulation well. No hemoptysis or any other acute events overnight. Remains on 5 L nasal cannula this morning. Physical therapy yet to evaluate patient. Family is at bedside.  REVIEW OF SYSTEMS:   Review of Systems  Unable to perform ROS: Dementia   Tolerating Diet:yes Tolerating PT: Await Eval.   DRUG ALLERGIES:  No Known Allergies  VITALS:  Blood pressure (!) 143/99, pulse 78, temperature 99.1 F (37.3 C), temperature source Oral, resp. rate 16, height 6\' 3"  (1.905 m), weight 80.3 kg (177 lb 1.6 oz), SpO2 100 %.  PHYSICAL EXAMINATION:   Physical Exam  GENERAL:  76 y.o.-year-old patient lying in the bed in no acute distress.  EYES: Pupils equal, round, reactive to light and accommodation. No scleral icterus. Extraocular muscles intact.  HEENT: Head atraumatic, normocephalic. Oropharynx and nasopharynx clear.  NECK:  Supple, no jugular venous distention. No thyroid enlargement, no tenderness.  LUNGS: Normal breath sounds bilaterally, no wheezing, rales, rhonchi. No use of accessory muscles of respiration.  CARDIOVASCULAR: S1, S2 normal. No murmurs, rubs, or gallops. Mild tachy ABDOMEN: Soft, nontender, nondistended. Bowel sounds present. No organomegaly or mass.  EXTREMITIES: No cyanosis, clubbing or edema b/l.    NEUROLOGIC: No focal motor or sensory deficits appreciated bilaterally, globally weak. PSYCHIATRIC:  patient is alert  SKIN: No obvious rash, lesion, or ulcer.   LABORATORY PANEL:  CBC Recent Labs  Lab 06/14/17 0359  WBC 8.2  HGB 12.1*  HCT 37.2*  PLT 164    Chemistries  Recent Labs  Lab 06/14/17 0359 06/14/17 1642  NA 136 136  K 2.8* 3.9  CL 107 106  CO2 21* 22  GLUCOSE 101* 97  BUN 19 18  CREATININE 1.42* 1.40*  CALCIUM 7.7*  8.0*  MG 1.8  --   AST 20  --   ALT 12*  --   ALKPHOS 30*  --   BILITOT 0.9  --    Cardiac Enzymes Recent Labs  Lab 06/14/17 0359  TROPONINI 0.07*   RADIOLOGY:  No results found. ASSESSMENT AND PLAN:  76 year old male with a past medical history as indicated below presented to the ED with complaints of generalized weakness, cough that is nonproductive chills, and a fall at home. History is obtained from ED records, and from patient's wife as patient is still very somnolent. The patient's wife, symptoms started gradually and got worse over time. Upon EMS arrival, patient was hypoxic on room air with SPO2 in the 80s. At the ED, patient was found to have bilateral pulmonary emboli and bilateral lower lobe pneumonia.   1. Bilateral pulmonary emboli.  - CT scan shows bilateral upper lobe, left lower lobe pulmonary emboli. -No chest pain, hemoptysis today. Was on heparin drip now switched over to oral Eliquis and tolerating it well. No pleuritic chest pain, shortness of breath today.   2. Acute hypoxic respiratory failure due to PE/ Pulm infarct -continue supportive care as mentioned above. -No evidence of acute infection and off antibiotics now. Continue supportive care with O2 supplementation in between as tolerated.  3. Stage III chronic renal failure. Appears to be at baseline. We'll hold his ACE inhibitor for now. Renal dose medications if needed.   4. Seizure disorder- no acute seizures.  - cont. Keppra.  5. Elevated troponin - due to supply demand ischemia from PE.  - ACS ruled out.   Await physical therapy evaluation, possible DC in next 24-48 hrs.   Case discussed with Care Management/Social Worker. Management plans discussed with the patient, family and they are in agreement.  CODE STATUS: full  DVT Prophylaxis: eliquis  TOTAL TIME TAKING CARE OF THIS PATIENT: 25 minutes.   POSSIBLE D/C IN 1-2 DAYS, DEPENDING ON CLINICAL CONDITION.  Note: This dictation was prepared  with Dragon dictation along with smaller phrase technology. Any transcriptional errors that result from this process are unintentional.  Henreitta Leber M.D on 06/15/2017 at 2:41 PM  Between 7am to 6pm - Pager - [phone]  After 6pm go to [email or web address] - password [email or web address] [facility]  Office  [phone]  CC: Primary care physician; [facility]

## 2017-06-15 NOTE — Evaluation (Signed)
Physical Therapy Evaluation Patient Details Name: TAVARUS POTEETE MRN: [id number] DOB: 16-Dec-1940 Today's Date: 06/15/2017   History of Present Illness  presented to ER secondary to weakness, chills; admitted with acute respiratory failure related to bilat PE, PNA.  Initially requiring non-rebreather, now weaned to 1.5L [place].  Statust post 48 hours anticoagulation at this time.  Clinical Impression  Patient resting in bed with eyes closed upon arrival to room.  Responds and interacts appropriately with therapist, but requires constant cuing for eyes open throughout session.  Patient globally weak and deconditioned throughout all extremities; no focal weakness or paresthesia appreciated.  Currently requiring max/total assist for bed mobility; mod/max assist for unsupported sitting balance (R posterior/lateral lean, minimal/no righitng reactions); mod/max assist +2 for sit/stand and SPT with RW.  Very poor sitting and standing balance; extremely high risk for falls without extensive assist of 2 skilled persons at this time. Would benefit from skilled PT to address above deficits and promote optimal return to PLOF; recommend transition to STR upon discharge from acute hospitalization.     Follow Up Recommendations SNF    Equipment Recommendations       Recommendations for Other Services       Precautions / Restrictions Precautions Precautions: Fall Restrictions Weight Bearing Restrictions: No      Mobility  Bed Mobility Overal bed mobility: Needs Assistance Bed Mobility: Supine to Sit     Supine to sit: Max assist        Transfers Overall transfer level: Needs assistance Equipment used: Rolling walker (2 wheeled) Transfers: Sit to/from Stand Sit to Stand: Mod assist;Max assist;+2 physical assistance         General transfer comment: assist for lift off (tends to pull on therapist), standing balance and midline orientation  Ambulation/Gait Ambulation/Gait assistance: Mod  assist;+2 physical assistance Ambulation Distance (Feet): 4 Feet Assistive device: Rolling walker (2 wheeled)       General Gait Details: very short, shuffling steps with poor balance, poor righting reactions.  R lateral lean at times with difficulty unweighting/advancing R LE  Stairs            Wheelchair Mobility    Modified Rankin (Stroke Patients Only)       Balance Overall balance assessment: Needs assistance Sitting-balance support: No upper extremity supported;Feet supported Sitting balance-Leahy Scale: Zero Sitting balance - Comments: absent protective righting reactions; R lateral lean (some degree of resistance to weight shift towards L) Postural control: Posterior lean;Right lateral lean Standing balance support: Bilateral upper extremity supported Standing balance-Leahy Scale: Poor                               Pertinent Vitals/Pain Pain Assessment: No/denies pain    Home Living Family/patient expects to be discharged to:: Private residence Living Arrangements: Spouse/significant other Available Help at Discharge: Family;Available 24 hours/day Type of Home: House Home Access: Stairs to enter Entrance Stairs-Rails: None Entrance Stairs-Number of Steps: 3 Home Layout: One level        Prior Function Level of Independence: Needs assistance         Comments: assist from wife for ADLs, household mobility as needed (does endorse functional decline in recent 2-3 weeks)     Hand Dominance        Extremity/Trunk Assessment   Upper Extremity Assessment Upper Extremity Assessment: Generalized weakness(grossly 3-/5 throughout; denies paresthesia)    Lower Extremity Assessment Lower Extremity Assessment: Generalized weakness(grossly 3-/5 throughout; denies  paresthesia)       Communication   Communication: No difficulties  Cognition Arousal/Alertness: Awake/alert Behavior During Therapy: Flat affect Overall Cognitive Status:  Difficult to assess                                 General Comments: oriented to basic information, but noted deficits in STM and more complex comprehension.  Tends to maintain eyes closed at all times (though able to open and focus on command)      General Comments      Exercises Other Exercises Other Exercises: Rolling bilat for hygiene, clothing/linen management after incontinent bladder, mod assist.  Hand-over-hand and max verbal cuing for hand placement and movement sequencing.  Cuing for opening eyes for active partiicpation with all activities Other Exercises: Unsupported sitting, L UE propped on pillows to L, worked on midline orientation in A/P and M/L planes, mod/max assist.  Poor awareness of midline, preferring to maintain R lateral lean.  Absent spontaneous righting/correction Other Exercises: Attempted education in lateral/scoot pivot tranfsers over level surfaces, requiring dep assist +2 for trunk control, weight shift and lateral movement.  Significant difficulty understanding new movement pattern.  Effort aborted and transfer completed via SPT with RW, max assis t+2   Assessment/Plan    PT Assessment Patient needs continued PT services  PT Problem List Decreased strength;Decreased range of motion;Decreased mobility;Decreased activity tolerance;Decreased balance;Decreased coordination;Decreased cognition;Decreased knowledge of use of DME;Decreased safety awareness;Decreased knowledge of precautions       PT Treatment Interventions DME instruction;Functional mobility training;Gait training;Stair training;Therapeutic activities;Therapeutic exercise;Balance training;Patient/family education    PT Goals (Current goals can be found in the Care Plan section)  Acute Rehab PT Goals Patient Stated Goal: per wife, to consider rehab PT Goal Formulation: With patient/family Time For Goal Achievement: 06/29/17 Potential to Achieve Goals: Fair    Frequency Min  2X/week   Barriers to discharge Decreased caregiver support      Co-evaluation               AM-PAC PT "6 Clicks" Daily Activity  Outcome Measure Difficulty turning over in bed (including adjusting bedclothes, sheets and blankets)?: Unable Difficulty moving from lying on back to sitting on the side of the bed? : Unable Difficulty sitting down on and standing up from a chair with arms (e.g., wheelchair, bedside commode, etc,.)?: Unable Help needed moving to and from a bed to chair (including a wheelchair)?: Total Help needed walking in hospital room?: Total Help needed climbing 3-5 steps with a railing? : Total 6 Click Score: 6    End of Session Equipment Utilized During Treatment: Gait belt Activity Tolerance: Patient limited by fatigue Patient left: in chair;with call bell/phone within reach;with chair alarm set;with family/visitor present Nurse Communication: Mobility status PT Visit Diagnosis: Unsteadiness on feet (R26.81);Muscle weakness (generalized) (M62.81);Difficulty in walking, not elsewhere classified (R26.2)    Time: 2376-2831 PT Time Calculation (min) (ACUTE ONLY): 48 min   Charges:   PT Evaluation $PT Eval Moderate Complexity: 1 Mod PT Treatments $Therapeutic Activity: 23-37 mins   PT G Codes:   PT G-Codes **NOT FOR INPATIENT CLASS** Functional Assessment Tool Used: AM-PAC 6 Clicks Basic Mobility Functional Limitation: Mobility: Walking and moving around Mobility: Walking and Moving Around Current Status (D1761): At least 80 percent but less than 100 percent impaired, limited or restricted Mobility: Walking and Moving Around Goal Status [id number]): At least 20 percent but less than 40 percent impaired,  limited or restricted    Brooksie Ellwanger H. Owens Shark, PT, DPT, NCS 06/15/17, 10:23 PM [phone]

## 2017-06-15 NOTE — Progress Notes (Signed)
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  He was found to be significantly hypoxemic with a room air saturation of 80% and had to be placed on a nonrebreather.  Chest x-ray indicated pneumonia versus possible hilar mass, therefore the patient was sent for CT chest with contrast.  Images personally reviewed from 06/13/17, there is bilateral pulmonary emboli with bibasilar pneumonia versus pulmonary infarction.   Assessment and Plan:  Acute pulmonary embolism with acute hypoxic respiratory failure. -Patient was on high flow nasal cannula, then weaned to 5 L nasal cannula, continue to wean down oxygen as tolerated. -Discussed with family at bedside, he lives at home, is mostly bedbound and has frequent falls at home, presenting a higher relative risk of complications with anticoagulation.  We will therefore check lower extremity Dopplers, if positive may benefit from IVC filter and discontinuation of anticoagulation.  Pneumonia versus pulmonary infarction. -Pro-calcitonin negative would therefore favor a shorter course or discontinuation of antibiotics.   Date: 06/15/2017  MRN# [id number] Philip Moreno 1940/10/23    Philip Moreno is a 76 y.o. old male seen in consultation for chief complaint of:    Chief Complaint  Patient presents with  . Weakness    Subjective  The patient is a 76 year old male with dementia, who lives at home and is cared for by family.  He is predominantly bed/couch around.  Patient's wife is at bedside and gives the history, as the patient is minimally verbal.  She says that he has fallen 2-3 times over the last week.  He has been increasingly weak over the last week, therefore he was brought to the hospital.  In the ED he underwent a CT of the chest which showed pulmonary embolism.  He was initially placed on 100%  nonrebreather subsequently transitioned to high flow nasal cannula.  He was then transferred to the general medical floor after being started on anticoagulation.  Currently he is laying in bed, appears comfortable, is minimally verbal, is in no distress.  Medication:    Current Facility-Administered Medications:  .  acetaminophen (TYLENOL) tablet 650 mg, 650 mg, Oral, Q6H PRN **OR** [DISCONTINUED] acetaminophen (TYLENOL) suppository 650 mg, 650 mg, Rectal, Q6H PRN, Baxter Hire, MD .  apixaban Arne [place]) tablet 10 mg, 10 mg, Oral, BID, 10 mg at 06/15/17 0932 **FOLLOWED BY** [START ON 06/21/2017] apixaban (ELIQUIS) tablet 5 mg, 5 mg, Oral, BID, Flora Lipps, MD .  azithromycin (ZITHROMAX) tablet 250 mg, 250 mg, Oral, q1800, Fritzi Mandes, MD .  cefTRIAXone (ROCEPHIN) 1 g in dextrose 5 % 50 mL IVPB, 1 g, Intravenous, q1800, Fritzi Mandes, MD, Stopped at 06/14/17 1945 .  feeding supplement (ENSURE ENLIVE) (ENSURE ENLIVE) liquid 237 mL, 237 mL, Oral, TID BM, Sainani, Vivek J, MD .  hydrALAZINE (APRESOLINE) injection 5 mg, 5 mg, Intravenous, Q6H PRN, Patria Mane, Magadalene S, NP, 5 mg at 06/15/17 0539 .  ipratropium-albuterol (DUONEB) 0.5-2.5 (3) MG/3ML nebulizer solution 3 mL, 3 mL, Nebulization, Q4H PRN, Wilhelmina Mcardle, MD .  levETIRAcetam (KEPPRA) tablet 1,000 mg, 1,000 mg, Oral, BID, Baxter Hire, MD, 1,000 mg at 06/15/17 0935 .  lisinopril (PRINIVIL,ZESTRIL) tablet 20 mg, 20 mg, Oral, Daily, Sainani, Vivek J, MD .  metoprolol succinate (TOPROL-XL) 24 hr tablet 100 mg, 100 mg, Oral, Daily, Baxter Hire, MD, 100 mg at 06/15/17 0931 .  multivitamin liquid 15 mL, 15 mL, Oral, Daily, Sainani, Belia Heman, MD .  [DISCONTINUED] ondansetron (ZOFRAN) tablet 4 mg, 4 mg, Oral, Q6H PRN **OR** ondansetron (ZOFRAN) injection 4 mg, 4 mg, Intravenous, Q6H PRN, Baxter Hire, MD .  QUEtiapine (SEROQUEL) tablet 25 mg, 25 mg, Oral, QHS, Baxter Hire, MD, 25 mg at 06/14/17 2216   Allergies:  Patient has no  known allergies.  Review of Systems: Patient cannot provide review of systems due to advanced dementia.  Physical Examination:   VS: BP 134/83 (BP Location: Left Arm)   Pulse 80   Temp 98.8 F (37.1 C) (Oral)   Resp 17   Ht 6\' 3"  (1.905 m)   Wt 177 lb 1.6 oz (80.3 kg) Comment: bed weight  SpO2 100%   BMI 22.14 kg/m   General Appearance: No distress  Neuro:without focal findings,  speech unintelligible HEENT: PERRLA, EOM intact.   Pulmonary: normal breath sounds, No wheezing.  CardiovascularNormal S1,S2.  No m/r/g.   Abdomen: Benign, Soft, non-tender. Renal:  No costovertebral tenderness  GU:  No performed at this time. Endoc: No evident thyromegaly, no signs of acromegaly. Skin:   warm, no rashes, no ecchymosis  Extremities: normal, no cyanosis, clubbing.  Other findings:    LABORATORY PANEL:   CBC Recent Labs  Lab 06/14/17 0359  WBC 8.2  HGB 12.1*  HCT 37.2*  PLT 164   ------------------------------------------------------------------------------------------------------------------  Chemistries  Recent Labs  Lab 06/14/17 0359 06/14/17 1642  NA 136 136  K 2.8* 3.9  CL 107 106  CO2 21* 22  GLUCOSE 101* 97  BUN 19 18  CREATININE 1.42* 1.40*  CALCIUM 7.7* 8.0*  MG 1.8  --   AST 20  --   ALT 12*  --   ALKPHOS 30*  --   BILITOT 0.9  --    ------------------------------------------------------------------------------------------------------------------  Cardiac Enzymes Recent Labs  Lab 06/14/17 0359  TROPONINI 0.07*   ------------------------------------------------------------  RADIOLOGY:  Ct Angio Chest Pe W/cm &/or Wo Cm  Result Date: 06/13/2017 CLINICAL DATA:  Shortness of breath with decreased oxygen saturation EXAM: CT ANGIOGRAPHY CHEST WITH CONTRAST TECHNIQUE: Multidetector CT imaging of the chest was performed using the standard protocol during bolus administration of intravenous contrast. Multiplanar CT image reconstructions and MIPs  were obtained to evaluate the vascular anatomy. CONTRAST:  49mL ISOVUE-370 IOPAMIDOL (ISOVUE-370) INJECTION 76% COMPARISON:  Chest CT January 09, 2016 and chest radiograph June 13, 2017 FINDINGS: Cardiovascular: There is a pulmonary embolus, incompletely obstructing, in the proximal right lower lobe pulmonary artery. There is incompletely obstructing pulmonary embolus along the superior aspect of the left main pulmonary artery with pulmonary embolus extending into left upper lobe pulmonary arteries. There are pulmonary emboli extending into right upper lobe pulmonary artery branches as well. The right ventricle to left ventricle diameter ratio is 0.8, not consistent with right heart strain. There is no thoracic aortic aneurysm or dissection. There are a few small foci of calcification in the proximal great vessels. There is calcification in the main pulmonary outflow tract. There is left ventricular hypertrophy. No pericardial effusion or thickening. Mediastinum/Nodes: Thyroid appears unremarkable. There are a few scattered subcentimeter lymph nodes in the mediastinum. There is no adenopathy by size criteria. No esophageal lesions are evident. Lungs/Pleura: There is airspace consolidation in portions of the superior and posterior segments of the left lower lobe. There is airspace consolidation in portions of the superior and lateral segments of the right lower lobe. There is a somewhat wedge-shaped area  of opacity along the periphery of the left upper lobe near the junction of the anterior posterior segment, a likely infarct. There is no appreciable pleural effusion. Upper Abdomen: In the visualized upper abdomen, there is reflux of contrast into the inferior vena cava and hepatic veins. Visualized upper abdominal structures otherwise appear unremarkable. Musculoskeletal: There are no blastic or lytic bone lesions. Review of the MIP images confirms the above findings. IMPRESSION: 1. Pulmonary emboli in both upper  lobe pulmonary arteries as well as an incompletely obstructing pulmonary embolus in the proximal right lower lobe pulmonary artery. There is incompletely obstructing thrombus along the superior aspect of the distal portion of the left main pulmonary artery. No right heart strain evident. There is left ventricular hypertrophy. 2. There is airspace consolidation consistent with pneumonia in both lower lobes. Probable infarct in the right upper lobe near the junction of the anterior and posterior segments. 3.  Scattered subcentimeter lymph nodes without frank adenopathy. 4. Reflux of contrast into the inferior vena cava and hepatic veins is felt to be indicative of increase in right heart pressure. 5. Aortic atherosclerosis. No aneurysm or dissection evident in the thoracic aorta. Critical Value/emergent results were called by telephone at the time of interpretation on 06/13/2017 at 1:53 pm to Dr. Lisa Roca , who verbally acknowledged these results. Aortic Atherosclerosis (ICD10-I70.0). Electronically Signed   By: Lowella Grip III M.D.   On: 06/13/2017 13:53   Dg Chest Portable 1 View  Result Date: 06/13/2017 CLINICAL DATA:  Weakness and fatigue. EXAM: PORTABLE CHEST 1 VIEW COMPARISON:  01/22/2017 FINDINGS: Cardiomediastinal silhouette is normal. Mediastinal contours appear intact. Calcific atherosclerotic disease and tortuosity of the aorta. There has been interval development of right perihilar airspace opacity versus pulmonary mass. Mildly increased interstitial markings. Osseous structures are without acute abnormality. Soft tissues are grossly normal. IMPRESSION: Apparent right perihilar airspace consolidation versus pulmonary mass. Mild pulmonary vascular congestion. Electronically Signed   By: Fidela Salisbury M.D.   On: 06/13/2017 12:16       Thank  you for the consultation and for allowing [facility] to assist in the care of your patient. Our recommendations are  noted above.  Please contact us if we can be of further service.   Marda Stalker, MD.  Board Certified in Internal Medicine, Pulmonary Medicine, [place], and Sleep Medicine.  [facility] Office Number: [phone]  Patricia Pesa, M.D.  Merton Border, M.D  06/15/2017

## 2017-06-16 ENCOUNTER — Inpatient Hospital Stay: Payer: Medicare Other

## 2017-06-16 DIAGNOSIS — I2699 Other pulmonary embolism without acute cor pulmonale: Secondary | ICD-10-CM

## 2017-06-16 DIAGNOSIS — N183 Chronic kidney disease, stage 3 (moderate): Secondary | ICD-10-CM

## 2017-06-16 DIAGNOSIS — R0602 Shortness of breath: Secondary | ICD-10-CM

## 2017-06-16 LAB — URINALYSIS, ROUTINE W REFLEX MICROSCOPIC
BACTERIA UA: NONE SEEN
BILIRUBIN URINE: NEGATIVE
Glucose, UA: NEGATIVE mg/dL
HGB URINE DIPSTICK: NEGATIVE
KETONES UR: NEGATIVE mg/dL
LEUKOCYTES UA: NEGATIVE
NITRITE: NEGATIVE
PH: 6 (ref 5.0–8.0)
Protein, ur: 30 mg/dL — AB
Specific Gravity, Urine: 1.014 (ref 1.005–1.030)
Squamous Epithelial / LPF: NONE SEEN

## 2017-06-16 LAB — PROCALCITONIN: Procalcitonin: <0.1 ng/mL

## 2017-06-16 MED ORDER — AZITHROMYCIN 250 MG PO TABS
500.0000 mg | ORAL_TABLET | Freq: Every day | ORAL | Status: DC
  Administered 2017-06-16: 500 mg via ORAL
  Filled 2017-06-16: qty 2

## 2017-06-16 MED ORDER — DEXTROSE 5 % IV SOLN
1.0000 g | INTRAVENOUS | Status: DC
  Administered 2017-06-16: 1 g via INTRAVENOUS
  Filled 2017-06-16 (×2): qty 10

## 2017-06-16 NOTE — Progress Notes (Signed)
[facility] NAME: Philip Moreno    MR#:  [id number]  DATE OF BIRTH:  08/22/1940  SUBJECTIVE:   Patient here due to bilateral pulmonary embolism. Tolerating anticoagulation well. No hemoptysis or any other acute events overnight. Therapy recommended short-term rehabilitation. Had Dopplers of his lower extremities yesterday which were positive for bilateral DVT.  Had a fever of 101 overnight but source unclear.   REVIEW OF SYSTEMS:   Review of Systems  Unable to perform ROS: Dementia   Tolerating Diet:yes Tolerating PT: Eval noted.   DRUG ALLERGIES:  No Known Allergies  VITALS:  Blood pressure (!) 147/103, pulse 81, temperature (!) 100.4 F (38 C), temperature source Oral, resp. rate (!) 32, height 6\' 3"  (1.905 m), weight 80.3 kg (177 lb 1.6 oz), SpO2 97 %.  PHYSICAL EXAMINATION:   Physical Exam  GENERAL:  76 y.o.-year-old patient lying in the bed in no acute distress.  EYES: Pupils equal, round, reactive to light and accommodation. No scleral icterus. Extraocular muscles intact.  HEENT: Head atraumatic, normocephalic. Oropharynx and nasopharynx clear.  NECK:  Supple, no jugular venous distention. No thyroid enlargement, no tenderness.  LUNGS: Normal breath sounds bilaterally, no wheezing, rales, rhonchi. No use of accessory muscles of respiration.  CARDIOVASCULAR: S1, S2 normal. No murmurs, rubs, or gallops. Mild tachy ABDOMEN: Soft, nontender, nondistended. Bowel sounds present. No organomegaly or mass.  EXTREMITIES: No cyanosis, clubbing or edema b/l.    NEUROLOGIC: No focal motor or sensory deficits appreciated bilaterally, globally weak. PSYCHIATRIC:  patient is alert  SKIN: No obvious rash, lesion, or ulcer.   LABORATORY PANEL:  CBC Recent Labs  Lab 06/14/17 0359  WBC 8.2  HGB 12.1*  HCT 37.2*  PLT 164    Chemistries  Recent Labs  Lab 06/14/17 0359 06/14/17 1642  NA 136 136  K 2.8* 3.9  CL 107  106  CO2 21* 22  GLUCOSE 101* 97  BUN 19 18  CREATININE 1.42* 1.40*  CALCIUM 7.7* 8.0*  MG 1.8  --   AST 20  --   ALT 12*  --   ALKPHOS 30*  --   BILITOT 0.9  --    Cardiac Enzymes Recent Labs  Lab 06/14/17 0359  TROPONINI 0.07*   RADIOLOGY:  US Venous Img Lower Bilateral  Result Date: 06/16/2017 CLINICAL DATA:  Pulmonary embolism.  Evaluate for DVT EXAM: BILATERAL LOWER EXTREMITY VENOUS DOPPLER ULTRASOUND TECHNIQUE: Gray-scale sonography with graded compression, as well as color Doppler and duplex ultrasound were performed to evaluate the lower extremity deep venous systems from the level of the common femoral vein and including the common femoral, femoral, profunda femoral, popliteal and calf veins including the posterior tibial, peroneal and gastrocnemius veins when visible. The superficial great saphenous vein was also interrogated. Spectral Doppler was utilized to evaluate flow at rest and with distal augmentation maneuvers in the common femoral, femoral and popliteal veins. COMPARISON:  Chest CTA -06/13/2017 FINDINGS: RIGHT LOWER EXTREMITY Common Femoral Vein: No evidence of thrombus. Normal compressibility, respiratory phasicity and response to augmentation. Saphenofemoral Junction: No evidence of thrombus. Normal compressibility and flow on color Doppler imaging. Profunda Femoral Vein: There is near occlusive thrombus involving the central aspect of the right deep femoral vein (image 39). Femoral Vein: No evidence of thrombus. Normal compressibility, respiratory phasicity and response to augmentation. Popliteal Vein: No evidence of thrombus. Normal compressibility, respiratory phasicity and response to augmentation. Calf Veins: No evidence of thrombus. Normal compressibility and  flow on color Doppler imaging. Superficial Great Saphenous Vein: No evidence of thrombus. Normal compressibility. Other Findings:  None. LEFT LOWER EXTREMITY Common Femoral Vein: No evidence of thrombus. Normal  compressibility, respiratory phasicity and response to augmentation. Saphenofemoral Junction: No evidence of thrombus. Normal compressibility and flow on color Doppler imaging. Profunda Femoral Vein: No evidence of thrombus. Normal compressibility and flow on color Doppler imaging. Femoral Vein: No evidence of thrombus. Normal compressibility, respiratory phasicity and response to augmentation. Popliteal Vein: There is hypoechoic near occlusive thrombus within the distal aspect of the left popliteal vein (image 25). Calf Veins: There is hypoechoic occlusive thrombus within 1 of the paired left posterior tibial veins. The adjacent posterior tibial vein appears patent. Both paired peroneal veins appear patent. Superficial Great Saphenous Vein: No evidence of thrombus. Normal compressibility. Other Findings:  None. IMPRESSION: Examination is positive for small burden bilateral DVT with near occlusive DVT involving the proximal aspect of the RIGHT deep femoral vein and short segment near occlusive DVT involving the distal aspect of the LEFT popliteal vein extending to involve one of the paired LEFT posterior tibial veins. Electronically Signed   By: Sandi Mariscal M.D.   On: 06/16/2017 10:38   ASSESSMENT AND PLAN:  76 year old male with a past medical history as indicated below presented to the ED with complaints of generalized weakness, cough that is nonproductive chills, and a fall at home. History is obtained from ED records, and from patient's wife as patient is still very somnolent. The patient's wife, symptoms started gradually and got worse over time. Upon EMS arrival, patient was hypoxic on room air with SPO2 in the 80s. At the ED, patient was found to have bilateral pulmonary emboli and bilateral lower lobe pneumonia.   1. Bilateral pulmonary emboli/DVT - CT scan shows bilateral upper lobe, left lower lobe pulmonary emboli. -No chest pain, hemoptysis today. Was on heparin drip now switched over to oral  Eliquis and tolerating it well. No pleuritic chest pain, shortness of breath today.  -Seen by pulmonary and Dopplers ordered yesterday which were positive for bilateral DVT. We will get a plastic surgery consult to see if the patient would benefit from IVC filter placement.  2. Acute hypoxic respiratory failure due to PE/ Pulm infarct -continue supportive care as mentioned above. -No evidence of acute infection and off antibiotics now. Continue supportive care with O2 supplementation in between as tolerated.  3. Stage III chronic renal failure. Appears to be at baseline.   4. Seizure disorder- no acute seizures.  - cont. Keppra.   5. Elevated troponin - due to supply demand ischemia from PE.  - ACS ruled out.   6. Essential HTN - cont. Lisinopril, PRN Hydralazine.   7. Fever - pt. Had a fever of 101 last night and it has improved. Source unclear.  Pt's ProCalcitonin level was normal therefore antibiotics were discontinued yesterday. I suspect this fever is likely from atelectasis or his pulmonary infarct. If continues to spike fevers would get blood cultures, urinalysis and chest x-ray.  Seen by PT and they recommend SNF/STR and Social Work aware.   Case discussed with Care Management/Social Worker. Management plans discussed with the patient, family and they are in agreement.  CODE STATUS: full  DVT Prophylaxis: eliquis  TOTAL TIME TAKING CARE OF THIS PATIENT: 30 minutes.   POSSIBLE D/C IN 2-3 DAYS, DEPENDING ON CLINICAL CONDITION.  Note: This dictation was prepared with Dragon dictation along with smaller phrase technology. Any transcriptional errors that  result from this process are unintentional.  Henreitta Leber M.D on 06/16/2017 at 1:54 PM  Between 7am to 6pm - Pager - [phone]  After 6pm go to [email or web address] - password [email or web address] [facility]  Office  [phone]  CC: Primary care physician; [facility]

## 2017-06-16 NOTE — Clinical Social Work Note (Signed)
Patient's wife had gone home to rest. Patient's daughters were in patient's room and so CSW informed them of bed offers and they chose [facility] since [facility] was not able to offer. Shela Leff MSW,LCSW [phone]

## 2017-06-16 NOTE — Progress Notes (Signed)
Chest x-ray results are back.  Family requesting to know results.  Dr Verdell Carmine paged.  Order received for pharmacy to consult for vanc management

## 2017-06-16 NOTE — NC FL2 (Signed)
[place] LEVEL OF CARE SCREENING TOOL     IDENTIFICATION  Patient Name: Philip Moreno Birthdate: 12-27-1940 Sex: male Admission Date (Current Location): 06/13/2017  [facility] and [place] Number:  Engineering geologist and Address:  [facility], [address] [place], [place] [address]      Provider Number: [id number]  Attending Physician Name and Address:  Henreitta Leber, MD  Relative Name and Phone Number:       Current Level of Care: Hospital Recommended Level of Care: [place] Prior Approval Number:    Date Approved/Denied:   PASRR Number:    Discharge Plan: SNF    Current Diagnoses: Patient Active Problem List   Diagnosis Date Noted  . Acute respiratory failure with hypoxia ([place])   . Pulmonary embolism ([place]) 06/13/2017  . Community acquired bacterial pneumonia 01/09/2016  . Gout attack 12/23/2015  . History of TIA (transient ischemic attack)   . Dementia   . Tachypnea   . Pyrexia   . Disorientation   . Hypokalemia   . AKI (acute kidney injury) ([place])   . Leukocytosis   . Acute blood loss anemia   . SIRS (systemic inflammatory response syndrome) (HCC)   . CVA (cerebral infarction) 12/21/2015  . Seizure ([place]) 12/20/2015  . Embolic stroke ([place]) 75/64/3329  . CKD (chronic kidney disease) stage 3, GFR 30-59 ml/min (HCC) 12/20/2015  . Fever, unspecified 12/20/2015  . Other specified transient cerebral ischemias 05/02/2014  . Pre-syncope 04/24/2014  . 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 Arthritis 04/24/2014  . BP (high blood pressure) 04/24/2014  . Confusion state 02/28/2014  . Dizziness 02/28/2014  . Convulsions ([place]) 02/28/2014    Orientation RESPIRATION BLADDER Height & Weight     Self, Place  Normal, O2(2 liters) Incontinent Weight: 177 lb 1.6 oz (80.3 kg)(bed weight) Height:  6\' 3"  (190.5 cm)  BEHAVIORAL SYMPTOMS/MOOD NEUROLOGICAL BOWEL NUTRITION STATUS  (none) (none)  Continent Diet(regular)  AMBULATORY STATUS COMMUNICATION OF NEEDS Skin   Extensive Assist Verbally Normal                       Personal Care Assistance Level of Assistance  Dressing, Bathing Bathing Assistance: Maximum assistance   Dressing Assistance: Maximum assistance     Functional Limitations Info  (none)          SPECIAL CARE FACTORS FREQUENCY  PT (By licensed PT)                    Contractures Contractures Info: Not present    Additional Factors Info  Code Status, Allergies Code Status Info: full Allergies Info: nka           Current Medications (06/16/2017):  This is the current hospital active medication list Current Facility-Administered Medications  Medication Dose Route Frequency Provider Last Rate Last Dose  . acetaminophen (TYLENOL) tablet 650 mg  650 mg Oral Q6H PRN Baxter Hire, MD   650 mg at 06/15/17 1939  . apixaban (ELIQUIS) tablet 10 mg  10 mg Oral BID Flora Lipps, MD   10 mg at 06/16/17 0935   Followed by  . [START ON 06/21/2017] apixaban (ELIQUIS) tablet 5 mg  5 mg Oral BID Flora Lipps, MD      . feeding supplement (ENSURE ENLIVE) (ENSURE ENLIVE) liquid 237 mL  237 mL Oral TID BM Henreitta Leber, MD   237 mL at 06/16/17 0937  . hydrALAZINE (APRESOLINE) injection  5 mg  5 mg Intravenous Q6H PRN Dorene Sorrow S, NP   5 mg at 06/15/17 0539  . ipratropium-albuterol (DUONEB) 0.5-2.5 (3) MG/3ML nebulizer solution 3 mL  3 mL Nebulization Q4H PRN Wilhelmina Mcardle, MD      . levETIRAcetam (KEPPRA) tablet 1,000 mg  1,000 mg Oral BID Baxter Hire, MD   1,000 mg at 06/16/17 0936  . lisinopril (PRINIVIL,ZESTRIL) tablet 20 mg  20 mg Oral Daily Henreitta Leber, MD   20 mg at 06/16/17 0934  . metoprolol succinate (TOPROL-XL) 24 hr tablet 100 mg  100 mg Oral Daily Harrel Lemon D, MD   100 mg at 06/16/17 0935  . multivitamin liquid 15 mL  15 mL Oral Daily Henreitta Leber, MD   15 mL at 06/16/17 0936  . ondansetron (ZOFRAN) injection  4 mg  4 mg Intravenous Q6H PRN Baxter Hire, MD      . QUEtiapine (SEROQUEL) tablet 25 mg  25 mg Oral QHS Baxter Hire, MD   25 mg at 06/15/17 2230  . senna (SENOKOT) tablet 17.2 mg  2 tablet Oral QHS Henreitta Leber, MD   17.2 mg at 06/15/17 2231     Discharge Medications: Please see discharge summary for a list of discharge medications.  Relevant Imaging Results:  Relevant Lab Results:   Additional Information ss: [id number]  Shela Leff, LCSW

## 2017-06-16 NOTE — Progress Notes (Signed)
Patient seen and examined full consult to follow  Patient presented with increasing shortness of breath.  Workup including CT scan demonstrated scattered pulmonary emboli small apical infarct on the right in association with fairly significant bilateral pneumonia.  No past history of clots was noted.  Subsequent duplex scan identified clots in the lower extremities as well.  Patient has not been on anticoagulation at home but he is quite sedentary decking secondary to his dementia.  Examination demonstrates a gentleman comfortable in bed on 2 L nasal cannula no audible wheezing is identified.  Lower extremities are not significantly edematous.  DVT in association with PE.  Patient is now on anticoagulation.  No indication for IVC filter at this time.  Given his clinical course and his comorbidities I do not recommend pulmonary thrombolysis either.

## 2017-06-16 NOTE — Clinical Social Work Note (Signed)
Clinical Social Work Assessment  Patient Details  Name: Philip Moreno MRN: [id number] Date of Birth: [birth year]  Date of referral:  06/16/17               Reason for consult:  Discharge Planning                Permission sought to share information with:    Permission granted to share information::     Name::        Agency::     Relationship::     Contact Information:     Housing/Transportation Living arrangements for the past 2 months:  [place] of Information:  Spouse Patient Interpreter Needed:  None Criminal Activity/Legal Involvement Pertinent to Current Situation/Hospitalization:  No - Comment as needed Significant Relationships:  Spouse Lives with:  Spouse Do you feel safe going back to the place where you live?  Yes Need for family participation in patient care:  Yes (Comment)  Care giving concerns:  Patient resides with his wife at home and PT is recommending STR.   Social Worker assessment / plan:  CSW has spoken to patient's wife and patient this morning and CSW introduced self and explained purpose of visit. Patient's wife is on board with STR. Patient did not talk throughout conversation. Patient's wife stated that he has been to [facility] in the past and that she would like for him to go there or to [facility]. She stated no to [facility]. CSW has initiated bed search. Will follow up with patient's wife once bed offers obtained.  Employment status:  Retired Nurse, adult PT Recommendations:  [place] / Referral to community resources:  [place]  Patient/Family's Response to care:  Patient's wife expressed appreciation for CSW visit and assistance.  Patient/Family's Understanding of and Emotional Response to Diagnosis, Current Treatment, and Prognosis:  Patient's wife is aware that patient may be heading toward the need for long term placement.  Emotional  Assessment Appearance:  Appears older than stated age Attitude/Demeanor/Rapport:    Affect (typically observed):  Calm, Quiet Orientation:  Oriented to Self, Oriented to Situation Alcohol / Substance use:  Not Applicable Psych involvement (Current and /or in the community):  No (Comment)  Discharge Needs  Concerns to be addressed:  Care Coordination Readmission within the last 30 days:  No Current discharge risk:  None Barriers to Discharge:  No Barriers Identified   Shela Leff, LCSW 06/16/2017, 10:58 AM

## 2017-06-16 NOTE — Progress Notes (Signed)
VS abnormal.  Shallow rapid breathing.  Dr Verdell Carmine informed.  Orders placed for ua and chest xray

## 2017-06-17 LAB — CBC
HEMATOCRIT: 37.9 % — AB (ref 40.0–52.0)
HEMOGLOBIN: 12.3 g/dL — AB (ref 13.0–18.0)
MCH: 25.9 pg — AB (ref 26.0–34.0)
MCHC: 32.6 g/dL (ref 32.0–36.0)
MCV: 79.5 fL — ABNORMAL LOW (ref 80.0–100.0)
Platelets: 209 10*3/uL (ref 150–440)
RBC: 4.76 MIL/uL (ref 4.40–5.90)
RDW: 15.1 % — ABNORMAL HIGH (ref 11.5–14.5)
WBC: 8.6 10*3/uL (ref 3.8–10.6)

## 2017-06-17 LAB — BASIC METABOLIC PANEL
Anion gap: 11 (ref 5–15)
BUN: 15 mg/dL (ref 6–20)
CALCIUM: 8.7 mg/dL — AB (ref 8.9–10.3)
CHLORIDE: 101 mmol/L (ref 101–111)
CO2: 22 mmol/L (ref 22–32)
CREATININE: 1.11 mg/dL (ref 0.61–1.24)
GFR calc non Af Amer: >60 mL/min (ref >60)
Glucose, Bld: 100 mg/dL — ABNORMAL HIGH (ref 65–99)
Potassium: 3.3 mmol/L — ABNORMAL LOW (ref 3.5–5.1)
Sodium: 134 mmol/L — ABNORMAL LOW (ref 135–145)

## 2017-06-17 MED ORDER — APIXABAN 5 MG PO TABS: 5.0000 mg | ORAL_TABLET | Freq: Two times a day (BID) | ORAL | 1 refills | Status: DC

## 2017-06-17 MED ORDER — AZITHROMYCIN 250 MG PO TABS
ORAL_TABLET | ORAL | 0 refills | Status: AC
Start: 2017-06-17 — End: 2017-06-22

## 2017-06-17 MED ORDER — AMOXICILLIN-POT CLAVULANATE 875-125 MG PO TABS
1.0000 | ORAL_TABLET | Freq: Two times a day (BID) | ORAL | 0 refills | Status: AC
Start: 2017-06-17 — End: 2017-07-01

## 2017-06-17 MED ORDER — ENSURE ENLIVE PO LIQD: 237.0000 mL | Freq: Three times a day (TID) | ORAL | 12 refills | Status: DC

## 2017-06-17 NOTE — Care Management Important Message (Signed)
Important Message  Patient Details  Name: Philip Moreno MRN: [id number] Date of Birth: 1941/01/22   Medicare Important Message Given:  Yes    Beverly Sessions, RN 06/17/2017, 12:24 PM

## 2017-06-17 NOTE — Clinical Social Work Placement (Signed)
   CLINICAL SOCIAL WORK PLACEMENT  NOTE  Date:  06/17/2017  Patient Details  Name: Philip Moreno MRN: [id number] Date of Birth: [birth year]  Clinical Social Work is seeking post-discharge placement for this patient at the [place] level of care (*CSW will initial, date and re-position this form in  chart as items are completed):  Yes   Patient/family provided with [facility] Work Department's list of facilities offering this level of care within the geographic area requested by the patient (or if unable, by the patient's family).  Yes   Patient/family informed of their freedom to choose among providers that offer the needed level of care, that participate in Medicare, Medicaid or managed care program needed by the patient, have an available bed and are willing to accept the patient.  Yes   Patient/family informed of [facility]'s ownership interest in [facility] and [facility], as well as of the fact that they are under no obligation to receive care at these facilities.  PASRR submitted to EDS on       PASRR number received on       Existing PASRR number confirmed on 06/16/17     FL2 transmitted to all facilities in geographic area requested by pt/family on 06/16/17     FL2 transmitted to all facilities within larger geographic area on       Patient informed that his/her managed care company has contracts with or will negotiate with certain facilities, including the following:        Yes   Patient/family informed of bed offers received.  Patient chooses bed at [facility])     Physician recommends and patient chooses bed at [facility])    Patient to be transferred to [facility]) on 06/17/17.  Patient to be transferred to facility by (EMS)     Patient family notified on 06/17/17 of transfer.  Name of family member notified:  (wife)     PHYSICIAN       Additional Comment:     _______________________________________________ Shela Leff, LCSW 06/17/2017, 11:28 AM

## 2017-06-17 NOTE — Discharge Summary (Signed)
Philip Moreno, is a 76 y.o. male  DOB 12-16-1940  MRN [id number].  Admission date:  06/13/2017  Admitting Physician  Baxter Hire, MD  Discharge Date:  06/17/2017   Primary MD  [facility]  Recommendations for primary care physician for things to follow:  Follow-up with PCP in 1 week   Admission Diagnosis  Hypoxia [R09.02] Other acute pulmonary embolism without acute cor pulmonale (HCC) [I26.99]   Discharge Diagnosis  Hypoxia [R09.02] Other acute pulmonary embolism without acute cor pulmonale (HCC) [I26.99]   Active Problems:   Community acquired bacterial pneumonia   Pulmonary embolism ([place])   Acute respiratory failure with hypoxia ([place])      Past Medical History:  Diagnosis Date  . Arthritis   . Cancer [facility]) 05/2011   prostate cancer, [facility]  . GERD (gastroesophageal reflux disease)   . Hypertension   . Memory loss   . Seizures ([place])   . TIA (transient ischemic attack)     Past Surgical History:  Procedure Laterality Date  . COLONOSCOPY WITH PROPOFOL N/A 02/27/2015   Procedure: COLONOSCOPY WITH PROPOFOL;  Surgeon: Christene Lye, MD;  Location: [facility] ENDOSCOPY;  Service: Endoscopy;  Laterality: N/A;  . LYMPHADENECTOMY  08/25/2012   Procedure: LYMPHADENECTOMY;  Surgeon: Dutch Gray, MD;  Location: [facility] ORS;  Service: Urology;  Laterality: Bilateral;  . PROSTATE SURGERY  2012  . ROBOT ASSISTED LAPAROSCOPIC RADICAL PROSTATECTOMY  08/25/2012   Procedure: ROBOTIC ASSISTED LAPAROSCOPIC RADICAL PROSTATECTOMY LEVEL 2;  Surgeon: Dutch Gray, MD;  Location: [facility] ORS;  Service: Urology;  Laterality: N/A;       History of present illness and  Hospital Course:     Kindly see H&P for history of present illness and admission details, please review complete Labs, Consult reports and Test reports  for all details in brief  HPI  from the history and physical done on the day of admission  76 year old male patient with dementia has been having very sedentary lifestyle came in because of chills, weakness, patient found to have pneumonia, bilateral PE.  Initially required 100% nonrebreather.  Hospital Course   #1 acute respiratory failure secondary to bilateral PE: Patient started on heparin, oxygen, he did not get any thrombolytics.  Showing hypoxia resolved, right now he is on room air saturation 97%.  We started on apixaban, stop heparin drip.  Ultrasound of the legs showed bilateral DVTs in the right femoral, left popliteal.  Patient is seen by vascular surgeon for evaluation of IVC filter, patient is seen by Dr. Graylon Good and did not recommend IVC filter, he is appearing comfortable and has no hypoxia and no indication for IVC filter, patient is tolerating apixaban.  We will stop Plavix at discharge, continue apixaban 10 mg p.o. twice daily for 7 days followed by 5 mg p.o. twice daily.  Patient is going to [employer] today.  2.  Fever secondary to pneumonia: Patient has been having fever for the past 2 days, chest x-ray showed patchy densities infrahilar region consistent with atelectasis, pneumonia on left side.  Patient has no fever after starting antibiotics.  And we will discharge him to [facility] with Augmentin, for 7 days, Z-Pak.  3.  Essential hypertension: Controlled 4.  Elevated troponin secondary to demand ischemia from PE: ACS ruled out. 5.  Acute respiratory failure with hypoxia secondary to PE: Resolved Stage III chronic renal failure: Appears to be at baseline. Deconditioning: Physical therapy recommended short-term rehab. Marland Kitchen  Discharge Condition:stable  Follow UP  Follow-up Information    [place], [facility]. Schedule an appointment as soon as possible for a visit.   Contact information: [place] [id number] [phone]              Discharge Instructions  and  Discharge Medications      Allergies as of 06/17/2017   No Known Allergies     Medication List    STOP taking these medications   clopidogrel 75 MG tablet Commonly known as:  PLAVIX     TAKE these medications   amoxicillin-clavulanate 875-125 MG tablet Commonly known as:  AUGMENTIN Take 1 tablet 2 (two) times daily for 14 days by mouth.   apixaban 5 MG Tabs tablet Commonly known as:  ELIQUIS Take 1 tablet (5 mg total) 2 (two) times daily by mouth.   azithromycin 250 MG tablet Commonly known as:  ZITHROMAX Z-PAK Take 2 tablets (500 mg) on  Day 1,  followed by 1 tablet (250 mg) once daily on Days 2 through 5.   donepezil 10 MG tablet Commonly known as:  ARICEPT Take 1 tablet (10 mg total) by mouth at bedtime.   feeding supplement (ENSURE ENLIVE) Liqd Take 237 mLs 3 (three) times daily between meals by mouth.   levETIRAcetam 500 MG tablet Commonly known as:  KEPPRA Take 2 tablets (1,000 mg total) by mouth 2 (two) times daily. What changed:  how much to take   lisinopril 20 MG tablet Commonly known as:  PRINIVIL,ZESTRIL Take 20 mg by mouth daily.   memantine 5 MG tablet Commonly known as:  NAMENDA Take 1 tablet (5 mg total) by mouth 2 (two) times daily.   metoprolol succinate 100 MG 24 hr tablet Commonly known as:  TOPROL-XL Take 100 mg by mouth daily. Take with or immediately following a meal.   QUEtiapine 25 MG tablet Commonly known as:  SEROQUEL Take 1 tablet (25 mg total) by mouth at bedtime.         Diet and Activity recommendation: See Discharge Instructions above   Consults obtained -vascular consult, pulmonary and critical care, social worker, physical therapy   Major procedures and Radiology Reports - PLEASE review detailed and final reports for all details, in brief -     Ct Angio Chest Pe W/cm &/or Wo Cm  Result Date: 06/13/2017 CLINICAL DATA:  Shortness of breath with decreased oxygen saturation  EXAM: CT ANGIOGRAPHY CHEST WITH CONTRAST TECHNIQUE: Multidetector CT imaging of the chest was performed using the standard protocol during bolus administration of intravenous contrast. Multiplanar CT image reconstructions and MIPs were obtained to evaluate the vascular anatomy. CONTRAST:  60mL ISOVUE-370 IOPAMIDOL (ISOVUE-370) INJECTION 76% COMPARISON:  Chest CT January 09, 2016 and chest radiograph June 13, 2017 FINDINGS: Cardiovascular: There is a pulmonary embolus, incompletely obstructing, in the proximal right lower lobe pulmonary artery. There is incompletely obstructing pulmonary embolus along the superior aspect of the left main pulmonary artery with pulmonary embolus extending into left upper lobe pulmonary arteries. There are pulmonary emboli extending into right upper lobe pulmonary artery branches as well. The right ventricle to left ventricle diameter ratio is 0.8, not consistent with right heart strain. There is no thoracic aortic aneurysm or dissection. There are a few small foci of calcification in the proximal great vessels. There is calcification in the main pulmonary outflow tract. There is left ventricular hypertrophy. No pericardial effusion or thickening. Mediastinum/Nodes: Thyroid appears unremarkable. There are a few scattered subcentimeter lymph nodes in  the mediastinum. There is no adenopathy by size criteria. No esophageal lesions are evident. Lungs/Pleura: There is airspace consolidation in portions of the superior and posterior segments of the left lower lobe. There is airspace consolidation in portions of the superior and lateral segments of the right lower lobe. There is a somewhat wedge-shaped area of opacity along the periphery of the left upper lobe near the junction of the anterior posterior segment, a likely infarct. There is no appreciable pleural effusion. Upper Abdomen: In the visualized upper abdomen, there is reflux of contrast into the inferior vena cava and hepatic veins.  Visualized upper abdominal structures otherwise appear unremarkable. Musculoskeletal: There are no blastic or lytic bone lesions. Review of the MIP images confirms the above findings. IMPRESSION: 1. Pulmonary emboli in both upper lobe pulmonary arteries as well as an incompletely obstructing pulmonary embolus in the proximal right lower lobe pulmonary artery. There is incompletely obstructing thrombus along the superior aspect of the distal portion of the left main pulmonary artery. No right heart strain evident. There is left ventricular hypertrophy. 2. There is airspace consolidation consistent with pneumonia in both lower lobes. Probable infarct in the right upper lobe near the junction of the anterior and posterior segments. 3.  Scattered subcentimeter lymph nodes without frank adenopathy. 4. Reflux of contrast into the inferior vena cava and hepatic veins is felt to be indicative of increase in right heart pressure. 5. Aortic atherosclerosis. No aneurysm or dissection evident in the thoracic aorta. Critical Value/emergent results were called by telephone at the time of interpretation on 06/13/2017 at 1:53 pm to Dr. Lisa Roca , who verbally acknowledged these results. Aortic Atherosclerosis (ICD10-I70.0). Electronically Signed   By: Lowella Grip III M.D.   On: 06/13/2017 13:53   US Venous Img Lower Bilateral  Result Date: 06/16/2017 CLINICAL DATA:  Pulmonary embolism.  Evaluate for DVT EXAM: BILATERAL LOWER EXTREMITY VENOUS DOPPLER ULTRASOUND TECHNIQUE: Gray-scale sonography with graded compression, as well as color Doppler and duplex ultrasound were performed to evaluate the lower extremity deep venous systems from the level of the common femoral vein and including the common femoral, femoral, profunda femoral, popliteal and calf veins including the posterior tibial, peroneal and gastrocnemius veins when visible. The superficial great saphenous vein was also interrogated. Spectral Doppler was  utilized to evaluate flow at rest and with distal augmentation maneuvers in the common femoral, femoral and popliteal veins. COMPARISON:  Chest CTA -06/13/2017 FINDINGS: RIGHT LOWER EXTREMITY Common Femoral Vein: No evidence of thrombus. Normal compressibility, respiratory phasicity and response to augmentation. Saphenofemoral Junction: No evidence of thrombus. Normal compressibility and flow on color Doppler imaging. Profunda Femoral Vein: There is near occlusive thrombus involving the central aspect of the right deep femoral vein (image 39). Femoral Vein: No evidence of thrombus. Normal compressibility, respiratory phasicity and response to augmentation. Popliteal Vein: No evidence of thrombus. Normal compressibility, respiratory phasicity and response to augmentation. Calf Veins: No evidence of thrombus. Normal compressibility and flow on color Doppler imaging. Superficial Great Saphenous Vein: No evidence of thrombus. Normal compressibility. Other Findings:  None. LEFT LOWER EXTREMITY Common Femoral Vein: No evidence of thrombus. Normal compressibility, respiratory phasicity and response to augmentation. Saphenofemoral Junction: No evidence of thrombus. Normal compressibility and flow on color Doppler imaging. Profunda Femoral Vein: No evidence of thrombus. Normal compressibility and flow on color Doppler imaging. Femoral Vein: No evidence of thrombus. Normal compressibility, respiratory phasicity and response to augmentation. Popliteal Vein: There is hypoechoic near occlusive thrombus within the distal aspect of  the left popliteal vein (image 25). Calf Veins: There is hypoechoic occlusive thrombus within 1 of the paired left posterior tibial veins. The adjacent posterior tibial vein appears patent. Both paired peroneal veins appear patent. Superficial Great Saphenous Vein: No evidence of thrombus. Normal compressibility. Other Findings:  None. IMPRESSION: Examination is positive for small burden bilateral DVT  with near occlusive DVT involving the proximal aspect of the RIGHT deep femoral vein and short segment near occlusive DVT involving the distal aspect of the LEFT popliteal vein extending to involve one of the paired LEFT posterior tibial veins. Electronically Signed   By: Sandi Mariscal M.D.   On: 06/16/2017 10:38   Dg Chest Port 1 View  Result Date: 06/16/2017 CLINICAL DATA:  Onset of fever yesterday. Currently hospitalized for bilateral pulmonary emboli. History of prostate malignancy, nonsmoker. EXAM: PORTABLE CHEST 1 VIEW COMPARISON:  Chest x-ray of June 13, 2017 and chest CT scan of the same date FINDINGS: The lungs are well-expanded. There is persistent hazy increased density in the right infrahilar region. There is subtle increased density in the left infrahilar region is well. There is linear density peripherally in the right mid lung. The heart is normal in size. There is tortuosity of the ascending and descending thoracic aorta. There is no pleural effusion. The bony thorax exhibits no acute abnormality. IMPRESSION: Patchy densities in the infrahilar regions bilaterally consistent with subsegmental atelectasis or pneumonia. The findings are stable to slightly more conspicuous than on the previous chest x-ray. No pleural effusion or pulmonary edema. Electronically Signed   By: David  Martinique M.D.   On: 06/16/2017 16:39   Dg Chest Portable 1 View  Result Date: 06/13/2017 CLINICAL DATA:  Weakness and fatigue. EXAM: PORTABLE CHEST 1 VIEW COMPARISON:  01/22/2017 FINDINGS: Cardiomediastinal silhouette is normal. Mediastinal contours appear intact. Calcific atherosclerotic disease and tortuosity of the aorta. There has been interval development of right perihilar airspace opacity versus pulmonary mass. Mildly increased interstitial markings. Osseous structures are without acute abnormality. Soft tissues are grossly normal. IMPRESSION: Apparent right perihilar airspace consolidation versus pulmonary  mass. Mild pulmonary vascular congestion. Electronically Signed   By: Fidela Salisbury M.D.   On: 06/13/2017 12:16    Micro Results    Recent Results (from the past 240 hour(s))  Culture, blood (routine x 2)     Status: None (Preliminary result)   Collection Time: 06/13/17 11:42 AM  Result Value Ref Range Status   Specimen Description BLOOD RIGHT ARM  Final   Special Requests   Final    BOTTLES DRAWN AEROBIC AND ANAEROBIC Blood Culture adequate volume   Culture NO GROWTH 4 DAYS  Final   Report Status PENDING  Incomplete  Culture, blood (routine x 2)     Status: None (Preliminary result)   Collection Time: 06/13/17 11:50 AM  Result Value Ref Range Status   Specimen Description BLOOD LEFT ARM  Final   Special Requests   Final    BOTTLES DRAWN AEROBIC AND ANAEROBIC Blood Culture adequate volume   Culture NO GROWTH 4 DAYS  Final   Report Status PENDING  Incomplete  MRSA PCR Screening     Status: None   Collection Time: 06/13/17  4:10 PM  Result Value Ref Range Status   MRSA by PCR NEGATIVE NEGATIVE Final    Comment:        The GeneXpert MRSA Assay (FDA approved for NASAL specimens only), is one component of a comprehensive MRSA colonization surveillance program. It is not intended to  diagnose MRSA infection nor to guide or monitor treatment for MRSA infections.        Today   Subjective:   Philip Moreno today is stable for discharge today.  Objective:   Blood pressure (!) 139/97, pulse 73, temperature 98.5 F (36.9 C), temperature source Oral, resp. rate 16, height 6\' 3"  (1.905 m), weight 80.3 kg (177 lb 1.6 oz), SpO2 97 %.   Intake/Output Summary (Last 24 hours) at 06/17/2017 1017 Last data filed at 06/17/2017 0941 Gross per 24 hour  Intake 440 ml  Output -  Net 440 ml    Exam Awake Alert, Oriented x 3, No new F.N deficits, Normal affect [place].AT,PERRAL Supple Neck,No JVD, No cervical lymphadenopathy appriciated.  Symmetrical Chest wall movement, Good air  movement bilaterally, CTAB RRR,No Gallops,Rubs or new Murmurs, No Parasternal Heave +ve B.Sounds, Abd Soft, Non tender, No organomegaly appriciated, No rebound -guarding or rigidity. No Cyanosis, Clubbing or edema, No new Rash or bruise  Data Review   CBC w Diff:  Lab Results  Component Value Date   WBC 8.6 06/17/2017   HGB 12.3 (L) 06/17/2017   HCT 37.9 (L) 06/17/2017   PLT 209 06/17/2017   LYMPHOPCT 12 06/13/2017   MONOPCT 12 06/13/2017   EOSPCT 0 06/13/2017   BASOPCT 0 06/13/2017    CMP:  Lab Results  Component Value Date   NA 134 (L) 06/17/2017   NA 140 04/23/2014   K 3.3 (L) 06/17/2017   CL 101 06/17/2017   CO2 22 06/17/2017   BUN 15 06/17/2017   BUN 26 04/23/2014   CREATININE 1.11 06/17/2017   PROT 6.4 (L) 06/14/2017   PROT 7.4 04/23/2014   ALBUMIN 2.8 (L) 06/14/2017   ALBUMIN 4.7 04/23/2014   BILITOT 0.9 06/14/2017   ALKPHOS 30 (L) 06/14/2017   AST 20 06/14/2017   ALT 12 (L) 06/14/2017  .   Total Time in preparing paper work, data evaluation and todays exam - 35 minutes  Epifanio Lesches M.D on 06/17/2017 at 10:17 AM    Note: This dictation was prepared with Dragon dictation along with smaller phrase technology. Any transcriptional errors that result from this process are unintentional.

## 2017-06-17 NOTE — Progress Notes (Signed)
Philip Moreno to be D/C'd Skilled nursing facility per MD order.  Discussed prescriptions and follow up appointments with the patient. Prescriptions given to patient, medication list explained in detail. Pt verbalized understanding.  Allergies as of 06/17/2017   No Known Allergies     Medication List    STOP taking these medications   clopidogrel 75 MG tablet Commonly known as:  PLAVIX     TAKE these medications   amoxicillin-clavulanate 875-125 MG tablet Commonly known as:  AUGMENTIN Take 1 tablet 2 (two) times daily for 14 days by mouth.   apixaban 5 MG Tabs tablet Commonly known as:  ELIQUIS Take 1 tablet (5 mg total) 2 (two) times daily by mouth.   azithromycin 250 MG tablet Commonly known as:  ZITHROMAX Z-PAK Take 2 tablets (500 mg) on  Day 1,  followed by 1 tablet (250 mg) once daily on Days 2 through 5.   donepezil 10 MG tablet Commonly known as:  ARICEPT Take 1 tablet (10 mg total) by mouth at bedtime.   feeding supplement (ENSURE ENLIVE) Liqd Take 237 mLs 3 (three) times daily between meals by mouth.   levETIRAcetam 500 MG tablet Commonly known as:  KEPPRA Take 2 tablets (1,000 mg total) by mouth 2 (two) times daily. What changed:  how much to take   lisinopril 20 MG tablet Commonly known as:  PRINIVIL,ZESTRIL Take 20 mg by mouth daily.   memantine 5 MG tablet Commonly known as:  NAMENDA Take 1 tablet (5 mg total) by mouth 2 (two) times daily.   metoprolol succinate 100 MG 24 hr tablet Commonly known as:  TOPROL-XL Take 100 mg by mouth daily. Take with or immediately following a meal.   QUEtiapine 25 MG tablet Commonly known as:  SEROQUEL Take 1 tablet (25 mg total) by mouth at bedtime.       Vitals:   06/17/17 1054 06/17/17 1126  BP:  (!) 134/92  Pulse:  73  Resp:  (!) 22  Temp: 98.8 F (37.1 C) 98.7 F (37.1 C)  SpO2:  95%    Skin clean, dry and intact without evidence of skin break down, no evidence of skin tears noted. IV catheter  discontinued intact. Site without signs and symptoms of complications. Dressing and pressure applied. Pt denies pain at this time. No complaints noted.  An After Visit Summary was printed and given to the patient. Patient transported via EMS to [facility].  Philip Moreno

## 2017-06-17 NOTE — Progress Notes (Signed)
Patient has active telemetry order; Box [id number] reapplied to patient; SR 70's.  Verified with Saralyn Pilar Welsh,CMCT/M. Norva Karvonen, RN. Barbaraann Faster, RN 2:34 AM 06/17/2017

## 2017-06-17 NOTE — Clinical Social Work Note (Signed)
MD stated patient is ready for discharge today. Patient's wife is aware and discharge information has been sent. Nurse to call report. Patient's wife requests EMS transport.  [facility] is aware and ready to receive patient. EMS here to transport at 11:20am. Shela Leff MSW,LCSW [phone]

## 2017-06-18 LAB — CULTURE, BLOOD (ROUTINE X 2)
CULTURE: NO GROWTH
Culture: NO GROWTH
SPECIAL REQUESTS: ADEQUATE
SPECIAL REQUESTS: ADEQUATE

## 2017-06-18 NOTE — Consult Note (Signed)
[facility] Consult Note  MRN : [id number]  Philip Moreno is a 76 y.o. (1940-08-11) male who presents with chief complaint of  Chief Complaint  Patient presents with  . Weakness  .  History of Present Illness: I am asked to evaluate the patient by Dr. Verdell Carmine.  The patient is a 76 year old gentleman who presented to the emergency room June 15, 2017 with increasing weakness associated with shortness of breath.  On the day of admission he had fallen and his wife called EMS.  On evaluation in the emergency room he was found to be hypoxic and placed on a 100% nonrebreather.  CT scan demonstrated bilateral pneumonias as well as bilateral pulmonary emboli.  I have asked to evaluate.  The history is largely obtained from his wife as the patient does have fairly advanced dementia.  She notes that he is not been very active of late spending much of his time sitting.  She also states that there is no history of past blood clots or pulmonary emboli.  No history of hypercoagulable syndrome.  No history of recent trauma other than the fall mentioned above.  No current facility-administered medications for this encounter.    Current Outpatient Medications  Medication Sig Dispense Refill  . levETIRAcetam (KEPPRA) 500 MG tablet Take 2 tablets (1,000 mg total) by mouth 2 (two) times daily. (Patient taking differently: Take 2 (two) times daily by mouth. ) 60 tablet 12  . lisinopril (PRINIVIL,ZESTRIL) 20 MG tablet Take 20 mg by mouth daily.    . metoprolol succinate (TOPROL-XL) 100 MG 24 hr tablet Take 100 mg by mouth daily. Take with or immediately following a meal.    . amoxicillin-clavulanate (AUGMENTIN) 875-125 MG tablet Take 1 tablet 2 (two) times daily for 14 days by mouth. 28 tablet 0  . apixaban (ELIQUIS) 5 MG TABS tablet Take 1 tablet (5 mg total) 2 (two) times daily by mouth. 60 tablet 1  . azithromycin (ZITHROMAX Z-PAK) 250 MG tablet Take 2 tablets (500 mg) on   Day 1,  followed by 1 tablet (250 mg) once daily on Days 2 through 5. 6 each 0  . donepezil (ARICEPT) 10 MG tablet Take 1 tablet (10 mg total) by mouth at bedtime. (Patient not taking: Reported on 06/13/2017) 30 tablet 12  . feeding supplement, ENSURE ENLIVE, (ENSURE ENLIVE) LIQD Take 237 mLs 3 (three) times daily between meals by mouth. 237 mL 12  . memantine (NAMENDA) 5 MG tablet Take 1 tablet (5 mg total) by mouth 2 (two) times daily. (Patient not taking: Reported on 06/13/2017) 60 tablet 11  . QUEtiapine (SEROQUEL) 25 MG tablet Take 1 tablet (25 mg total) by mouth at bedtime. (Patient not taking: Reported on 06/13/2017) 30 tablet 2    Past Medical History:  Diagnosis Date  . Arthritis   . Cancer [facility]) 05/2011   prostate cancer, [facility]  . GERD (gastroesophageal reflux disease)   . Hypertension   . Memory loss   . Seizures ([place])   . TIA (transient ischemic attack)     Past Surgical History:  Procedure Laterality Date  . COLONOSCOPY WITH PROPOFOL N/A 02/27/2015   Performed by Christene Lye, MD at [place]  . LYMPHADENECTOMY Bilateral 08/25/2012   Performed by Dutch Gray, MD at [facility] ORS  . PROSTATE SURGERY  2012  . ROBOTIC ASSISTED LAPAROSCOPIC RADICAL PROSTATECTOMY LEVEL 2 N/A 08/25/2012   Performed by Dutch Gray, MD at [facility] ORS    Social  History Social History   Tobacco Use  . Smoking status: Never Smoker  . Smokeless tobacco: Current User    Types: Snuff, Chew  Substance Use Topics  . Alcohol use: No    Alcohol/week: 0.0 oz  . Drug use: No    Family History Family History  Problem Relation Age of Onset  . Hypertension Mother   . Heart attack Father   . Cancer Brother   . Heart failure Brother   . Diabetes Brother   No family history of bleeding/clotting disorders, porphyria or autoimmune disease   No Known Allergies   REVIEW OF SYSTEMS (Negative unless checked)  Constitutional: [] Weight loss  [] Fever  [] Chills Cardiac: [] Chest pain    [] Chest pressure   [] Palpitations   [] Shortness of breath when laying flat   [] Shortness of breath at rest   [x] Shortness of breath with exertion. Vascular:  [] Pain in legs with walking   [x] Pain in legs at rest   [] Pain in legs when laying flat   [] Claudication   [] Pain in feet when walking  [] Pain in feet at rest  [] Pain in feet when laying flat   [x] DVT   [] Phlebitis   [] Swelling in legs   [] Varicose veins   [] Non-healing ulcers Pulmonary:   [] Uses home oxygen   [] Productive cough   [] Hemoptysis   [] Wheeze  [] COPD   [] Asthma Neurologic:  [] Dizziness  [] Blackouts   [] Seizures   [] History of stroke   [] History of TIA  [] Aphasia   [] Temporary blindness   [] Dysphagia   [] Weakness or numbness in arms   [] Weakness or numbness in legs Musculoskeletal:  [] Arthritis   [] Joint swelling   [] Joint pain   [] Low back pain Hematologic:  [] Easy bruising  [] Easy bleeding   [] Hypercoagulable state   [] Anemic  [] Hepatitis Gastrointestinal:  [] Blood in stool   [] Vomiting blood  [] Gastroesophageal reflux/heartburn   [] Difficulty swallowing. Genitourinary:  [] Chronic kidney disease   [] Difficult urination  [] Frequent urination  [] Burning with urination   [] Blood in urine Skin:  [] Rashes   [] Ulcers   [] Wounds Psychological:  [x] History of dementia   []  History of major depression.  Physical Examination  Vitals:   06/17/17 0602 06/17/17 0950 06/17/17 1054 06/17/17 1126  BP: (!) 145/95 (!) 139/97  (!) 134/92  Pulse: 75 73  73  Resp: 16   (!) 22  Temp: 98.5 F (36.9 C)  98.8 F (37.1 C) 98.7 F (37.1 C)  TempSrc: Oral  Oral Oral  SpO2: 97%   95%  Weight:      Height:       Body mass index is 22.14 kg/m. Gen:  WD/WN, patient lying in bed in mild distress currently on 3 L nasal cannula Head: [place]/AT, positive temporalis wasting. Prominent temp pulse not noted. Ear/Nose/Throat: Hearing grossly intact, nares w/o erythema or drainage, oropharynx w/o Erythema/Exudate Eyes: Sclera non-icteric, conjunctiva  clear Neck: Trachea midline.  No JVD.  Pulmonary:  Good air movement, respirations not labored, equal bilaterally.  Cardiac: RRR, normal S1, S2. Vascular: Minimal edema bilateral lower extremities Vessel Right Left  Radial Palpable Palpable  PT Palpable Palpable  DP Palpable Palpable  Gastrointestinal: soft, non-tender/non-distended. No guarding/reflex.  Musculoskeletal: M/S 5/5 throughout.  Extremities without ischemic changes.  No deformity or atrophy. No edema. Neurologic: Sensation grossly intact in extremities.  Symmetrical.  Speech is not fluent. Motor exam as listed above. Psychiatric: Judgment poor, Mood & affect appropriate for pt's clinical situation. Dermatologic: No rashes or ulcers noted.  No cellulitis or  open wounds. Lymph : No Cervical, Axillary, or Inguinal lymphadenopathy.      CBC Lab Results  Component Value Date   WBC 8.6 06/17/2017   HGB 12.3 (L) 06/17/2017   HCT 37.9 (L) 06/17/2017   MCV 79.5 (L) 06/17/2017   PLT 209 06/17/2017    BMET    Component Value Date/Time   NA 134 (L) 06/17/2017 0513   NA 140 04/23/2014 1310   K 3.3 (L) 06/17/2017 0513   CL 101 06/17/2017 0513   CO2 22 06/17/2017 0513   GLUCOSE 100 (H) 06/17/2017 0513   BUN 15 06/17/2017 0513   BUN 26 04/23/2014 1310   CREATININE 1.11 06/17/2017 0513   CALCIUM 8.7 (L) 06/17/2017 0513   GFRNONAA >60 06/17/2017 0513   GFRAA >60 06/17/2017 0513   Estimated Creatinine Clearance: 66.3 mL/min (by C-G formula based on SCr of 1.11 mg/dL).  COAG Lab Results  Component Value Date   INR 1.16 06/13/2017   INR 1.13 12/20/2015    Radiology Ct Angio Chest Pe W/cm &/or Wo Cm  Result Date: 06/13/2017 CLINICAL DATA:  Shortness of breath with decreased oxygen saturation EXAM: CT ANGIOGRAPHY CHEST WITH CONTRAST TECHNIQUE: Multidetector CT imaging of the chest was performed using the standard protocol during bolus administration of intravenous contrast. Multiplanar CT image reconstructions and  MIPs were obtained to evaluate the vascular anatomy. CONTRAST:  32mL ISOVUE-370 IOPAMIDOL (ISOVUE-370) INJECTION 76% COMPARISON:  Chest CT January 09, 2016 and chest radiograph June 13, 2017 FINDINGS: Cardiovascular: There is a pulmonary embolus, incompletely obstructing, in the proximal right lower lobe pulmonary artery. There is incompletely obstructing pulmonary embolus along the superior aspect of the left main pulmonary artery with pulmonary embolus extending into left upper lobe pulmonary arteries. There are pulmonary emboli extending into right upper lobe pulmonary artery branches as well. The right ventricle to left ventricle diameter ratio is 0.8, not consistent with right heart strain. There is no thoracic aortic aneurysm or dissection. There are a few small foci of calcification in the proximal great vessels. There is calcification in the main pulmonary outflow tract. There is left ventricular hypertrophy. No pericardial effusion or thickening. Mediastinum/Nodes: Thyroid appears unremarkable. There are a few scattered subcentimeter lymph nodes in the mediastinum. There is no adenopathy by size criteria. No esophageal lesions are evident. Lungs/Pleura: There is airspace consolidation in portions of the superior and posterior segments of the left lower lobe. There is airspace consolidation in portions of the superior and lateral segments of the right lower lobe. There is a somewhat wedge-shaped area of opacity along the periphery of the left upper lobe near the junction of the anterior posterior segment, a likely infarct. There is no appreciable pleural effusion. Upper Abdomen: In the visualized upper abdomen, there is reflux of contrast into the inferior vena cava and hepatic veins. Visualized upper abdominal structures otherwise appear unremarkable. Musculoskeletal: There are no blastic or lytic bone lesions. Review of the MIP images confirms the above findings. IMPRESSION: 1. Pulmonary emboli in both  upper lobe pulmonary arteries as well as an incompletely obstructing pulmonary embolus in the proximal right lower lobe pulmonary artery. There is incompletely obstructing thrombus along the superior aspect of the distal portion of the left main pulmonary artery. No right heart strain evident. There is left ventricular hypertrophy. 2. There is airspace consolidation consistent with pneumonia in both lower lobes. Probable infarct in the right upper lobe near the junction of the anterior and posterior segments. 3.  Scattered subcentimeter lymph nodes without  frank adenopathy. 4. Reflux of contrast into the inferior vena cava and hepatic veins is felt to be indicative of increase in right heart pressure. 5. Aortic atherosclerosis. No aneurysm or dissection evident in the thoracic aorta. Critical Value/emergent results were called by telephone at the time of interpretation on 06/13/2017 at 1:53 pm to Dr. Lisa Roca , who verbally acknowledged these results. Aortic Atherosclerosis (ICD10-I70.0). Electronically Signed   By: Lowella Grip III M.D.   On: 06/13/2017 13:53   US Venous Img Lower Bilateral  Result Date: 06/16/2017 CLINICAL DATA:  Pulmonary embolism.  Evaluate for DVT EXAM: BILATERAL LOWER EXTREMITY VENOUS DOPPLER ULTRASOUND TECHNIQUE: Gray-scale sonography with graded compression, as well as color Doppler and duplex ultrasound were performed to evaluate the lower extremity deep venous systems from the level of the common femoral vein and including the common femoral, femoral, profunda femoral, popliteal and calf veins including the posterior tibial, peroneal and gastrocnemius veins when visible. The superficial great saphenous vein was also interrogated. Spectral Doppler was utilized to evaluate flow at rest and with distal augmentation maneuvers in the common femoral, femoral and popliteal veins. COMPARISON:  Chest CTA -06/13/2017 FINDINGS: RIGHT LOWER EXTREMITY Common Femoral Vein: No evidence of  thrombus. Normal compressibility, respiratory phasicity and response to augmentation. Saphenofemoral Junction: No evidence of thrombus. Normal compressibility and flow on color Doppler imaging. Profunda Femoral Vein: There is near occlusive thrombus involving the central aspect of the right deep femoral vein (image 39). Femoral Vein: No evidence of thrombus. Normal compressibility, respiratory phasicity and response to augmentation. Popliteal Vein: No evidence of thrombus. Normal compressibility, respiratory phasicity and response to augmentation. Calf Veins: No evidence of thrombus. Normal compressibility and flow on color Doppler imaging. Superficial Great Saphenous Vein: No evidence of thrombus. Normal compressibility. Other Findings:  None. LEFT LOWER EXTREMITY Common Femoral Vein: No evidence of thrombus. Normal compressibility, respiratory phasicity and response to augmentation. Saphenofemoral Junction: No evidence of thrombus. Normal compressibility and flow on color Doppler imaging. Profunda Femoral Vein: No evidence of thrombus. Normal compressibility and flow on color Doppler imaging. Femoral Vein: No evidence of thrombus. Normal compressibility, respiratory phasicity and response to augmentation. Popliteal Vein: There is hypoechoic near occlusive thrombus within the distal aspect of the left popliteal vein (image 25). Calf Veins: There is hypoechoic occlusive thrombus within 1 of the paired left posterior tibial veins. The adjacent posterior tibial vein appears patent. Both paired peroneal veins appear patent. Superficial Great Saphenous Vein: No evidence of thrombus. Normal compressibility. Other Findings:  None. IMPRESSION: Examination is positive for small burden bilateral DVT with near occlusive DVT involving the proximal aspect of the RIGHT deep femoral vein and short segment near occlusive DVT involving the distal aspect of the LEFT popliteal vein extending to involve one of the paired LEFT  posterior tibial veins. Electronically Signed   By: Sandi Mariscal M.D.   On: 06/16/2017 10:38   Dg Chest Port 1 View  Result Date: 06/16/2017 CLINICAL DATA:  Onset of fever yesterday. Currently hospitalized for bilateral pulmonary emboli. History of prostate malignancy, nonsmoker. EXAM: PORTABLE CHEST 1 VIEW COMPARISON:  Chest x-ray of June 13, 2017 and chest CT scan of the same date FINDINGS: The lungs are well-expanded. There is persistent hazy increased density in the right infrahilar region. There is subtle increased density in the left infrahilar region is well. There is linear density peripherally in the right mid lung. The heart is normal in size. There is tortuosity of the ascending and descending thoracic aorta. There  is no pleural effusion. The bony thorax exhibits no acute abnormality. IMPRESSION: Patchy densities in the infrahilar regions bilaterally consistent with subsegmental atelectasis or pneumonia. The findings are stable to slightly more conspicuous than on the previous chest x-ray. No pleural effusion or pulmonary edema. Electronically Signed   By: David  Martinique M.D.   On: 06/16/2017 16:39   Dg Chest Portable 1 View  Result Date: 06/13/2017 CLINICAL DATA:  Weakness and fatigue. EXAM: PORTABLE CHEST 1 VIEW COMPARISON:  01/22/2017 FINDINGS: Cardiomediastinal silhouette is normal. Mediastinal contours appear intact. Calcific atherosclerotic disease and tortuosity of the aorta. There has been interval development of right perihilar airspace opacity versus pulmonary mass. Mildly increased interstitial markings. Osseous structures are without acute abnormality. Soft tissues are grossly normal. IMPRESSION: Apparent right perihilar airspace consolidation versus pulmonary mass. Mild pulmonary vascular congestion. Electronically Signed   By: Fidela Salisbury M.D.   On: 06/13/2017 12:16      Assessment/Plan 1. Bilateral pulmonary emboli associated with bilateral DVT of the lower  extremities: CT scan shows bilateral PE with the right upper lobe upper lobe suspicious for associated infarct.  He is not tachycardic or hypotensive. He looks comfortable 2 L nasal cannula.  He is on heparin.  He also has significant bilateral pneumonias.  His DVT was identified after his PE with subsequent duplex ultrasound was ordered therefore does not represent failure of anticoagulation.  Based on my reading of his CT scan and my examination and his clinical situation I do not recommend IVC filter at this time.  He does not appear to require thrombolysis either.  Especially in light of his infarct and his pneumonia this would significantly increase his risks. 2. Acute respiratory failure. Continue oxygen support.  Continue aerosols as ordered antibiotics as well. 3. Pneumonia. CT scan did show some bilateral lower lobe infiltrates.  He is on Zosyn and Levaquin. 4. Stage III chronic renal failure. Appears to be at baseline. We'll hold his ACE inhibitor for now. Renal dose medications if needed.   5. Seizure disorder. Continue his current medications.    Hortencia Pilar, MD  06/18/2017 12:07 PM    This note was created with Dragon medical transcription system.  Any error is purely unintentional

## 2017-08-31 ENCOUNTER — Encounter (INDEPENDENT_AMBULATORY_CARE_PROVIDER_SITE_OTHER): Payer: Self-pay

## 2017-08-31 ENCOUNTER — Ambulatory Visit: Payer: Medicare Other | Admitting: Neurology

## 2017-08-31 ENCOUNTER — Encounter: Payer: Self-pay | Admitting: Neurology

## 2017-08-31 VITALS — BP 125/85 | HR 64 | Ht 75.0 in | Wt 184.0 lb

## 2017-08-31 DIAGNOSIS — W19XXXD Unspecified fall, subsequent encounter: Secondary | ICD-10-CM

## 2017-08-31 DIAGNOSIS — F0391 Unspecified dementia with behavioral disturbance: Secondary | ICD-10-CM

## 2017-08-31 DIAGNOSIS — R4189 Other symptoms and signs involving cognitive functions and awareness: Secondary | ICD-10-CM

## 2017-08-31 DIAGNOSIS — R41841 Cognitive communication deficit: Secondary | ICD-10-CM | POA: Diagnosis not present

## 2017-08-31 NOTE — Progress Notes (Signed)
[facility]    Provider:  [name] Referring Provider: [facility] Primary Care Physician:  [facility]  CC:  Stroke and seizure  Interval history 08/31/2017: Patient is here for dementia. He has a PMHx of dementia already on Aricept.  He also has seizures on Keppra.  He also has a history of strokes.  He is currently living in a rehab facility but going home to live with his wife.  He is also had numerous blood clots in his lungs.  Recent hospitalization for pneumonia.  Hypertension is long-standing.  Dementia developed in 2013, he has been on Aricept.  He has received physical therapy and occupational therapy and rehab and is now able to feed himself again.  He also took Namenda at one point.  He has had numerous falls.  He is receiving physical therapy for balance issues and rehab with improvement.  He is now able to walk with a walker.  He continues to be unsteady.  Seizure disorder was diagnosed in 2017.  He was seizure-free but then had another while in the hospital within the last few weeks.  Not currently taking any seizure medication.   He has been taking the Keppra. Never stopped the Keppra. Stopped the Aricept and Namenda and prefers not to go back on it. I think it is of limited clinical use at this point in his dementia. He continues to have falls, worsening, memory is worsening. She has a lot of support, daughter stays often. They have updated the home for fall risks. He had PT at home. Significant decline in memory and more falls.   HPI:  Philip Moreno is a 77 y.o. male here as a referral from Dr. Brynda Greathouse for follow up after the hospital. He is here with his wife who provides most information. PMHx of dementia, stroke, acute kidney injury, hypertension, chronic kidney disease, seizures. Patient was admitted to the hospital in May after a convulsion. He had a convulsion at home and 911 was called and then in the emergency room had another  episode, shaking all over, generalized tonic clonic or light. He was started on vimpat. That was too expensive and is now on Keppra. He has since not had any episodes since starting the Keppra. He is on Keppra, wife doesn't know the dosage. No side effects. He is on Plavix for stroke prevention. His memory is poor. Wife doesn;t know if he is on 5mg  or 10mg  of Aricept, doesn't know his medications. He continues to have progressive memory loss. 2 weeks ago he was trying to get out of bed to go to the restroom and he just stood there and the next thing he fell back into her arms in the bathroom and couldn't get up. He was responsive but says he couldn't move, he was aware but having difficulty walking. He has balance issues, he is walking slowly which is chronic. Patient says says he feels funny in his head. He can't explain it. No side effects to the keppra, not more irritable,  he does have some behavior issues over the last several years with the dementia. He gets loud. Denies depression. Denies head pain. He was on Namenda but she stopped it, she would like to restart it.   Reviewed notes, labs and imaging from outside physicians, which showed:  LDL 48, A1c 6.4, BUN 23, creatinine 1.6, TSH 10.6  Ct Head Wo Contrast 12/20/2015  No acute intracranial abnormalities. Chronic atrophy and small vessel ischemic  changes.   Mr Kizzie Fantasia Contrast 12/20/2015  1. Three small acute/early subacute infarcts in the high frontal lobes and right occipital lobe, emboli versus watershed ischemia.  2. Progressive, mild-to-moderate chronic small vessel ischemic disease including new small chronic cortical infarcts in the high frontal lobes.  3. Remote left occipital and bilateral cerebellar infarcts.   Dg Chest Port 1 View 12/20/2015  Linear atelectasis in the lung bases.   EEG - pending  Mra Head Wo Contrast 12/21/2015 IMPRESSION: 1. The study is severely degraded by patient motion through the cavernous  internal carotid arteries and basilar artery. No comments can be made on these areas. 2. The A1 and M1 segments and proximal posterior cerebral arteries are intact. 3. Moderate attenuation of MCA and distal PCA branch vessels may be exaggerated by patient motion.   CUS- Bilateral: 1-39% ICA stenosis. Vertebral artery flow is antegrade.  TTE- - Procedure narrative: Transthoracic echocardiography. Image  quality was adequate. The study was technically difficult. - Left ventricle: The cavity size was normal. There was mild  concentric hypertrophy. Systolic function was normal. The  estimated ejection fraction was in the range of 55% to 60%.  Doppler parameters are consistent with abnormal left ventricular  relaxation (grade 1 diastolic dysfunction). - Aortic valve: There was trivial regurgitation. Impressions: - No cardiac source of emboli was indentified.  Social History   Socioeconomic History  . Marital status: Married    Spouse name: Philip Moreno   . Number of children: 0  . Years of education: 7  . Highest education level: Not on file  Social Needs  . Financial resource strain: Not on file  . Food insecurity - worry: Not on file  . Food insecurity - inability: Not on file  . Transportation needs - medical: Not on file  . Transportation needs - non-medical: Not on file  Occupational History  . Occupation: farmer   Tobacco Use  . Smoking status: Never Smoker  . Smokeless tobacco: Current User    Types: Snuff, Chew  Substance and Sexual Activity  . Alcohol use: No    Alcohol/week: 0.0 oz  . Drug use: No  . Sexual activity: Not on file  Other Topics Concern  . Not on file  Social History Narrative   Patient lives at home with his wife    Patient is a part time farmer    Patient has a 7th grade education.    Patient has no children.    Caffeine use: daily   Right handed    Family History  Problem Relation Age of Onset  . Hypertension Mother   . Heart attack  Father   . Cancer Brother   . Heart failure Brother   . Diabetes Brother     Past Medical History:  Diagnosis Date  . Arthritis   . Cancer [facility]) 05/2011   prostate cancer, [facility]  . DVT (deep venous thrombosis) ([place])   . GERD (gastroesophageal reflux disease)   . Hypertension   . Memory loss   . Pneumonia   . Pulmonary embolism ([place])   . Seizures ([place])   . TIA (transient ischemic attack)     Past Surgical History:  Procedure Laterality Date  . COLONOSCOPY WITH PROPOFOL N/A 02/27/2015   Procedure: COLONOSCOPY WITH PROPOFOL;  Surgeon: Christene Lye, MD;  Location: [facility] ENDOSCOPY;  Service: Endoscopy;  Laterality: N/A;  . LYMPHADENECTOMY  08/25/2012   Procedure: LYMPHADENECTOMY;  Surgeon: Dutch Gray, MD;  Location: [facility] ORS;  Service:  Urology;  Laterality: Bilateral;  . PROSTATE SURGERY  2012  . ROBOT ASSISTED LAPAROSCOPIC RADICAL PROSTATECTOMY  08/25/2012   Procedure: ROBOTIC ASSISTED LAPAROSCOPIC RADICAL PROSTATECTOMY LEVEL 2;  Surgeon: Dutch Gray, MD;  Location: [facility] ORS;  Service: Urology;  Laterality: N/A;    Current Outpatient Medications  Medication Sig Dispense Refill  . apixaban (ELIQUIS) 5 MG TABS tablet Take 1 tablet (5 mg total) 2 (two) times daily by mouth. 60 tablet 1  . feeding supplement, ENSURE ENLIVE, (ENSURE ENLIVE) LIQD Take 237 mLs 3 (three) times daily between meals by mouth. 237 mL 12  . levETIRAcetam (KEPPRA) 500 MG tablet Take 2 tablets (1,000 mg total) by mouth 2 (two) times daily. (Patient taking differently: Take 2 (two) times daily by mouth. ) 60 tablet 12  . lisinopril (PRINIVIL,ZESTRIL) 20 MG tablet Take 20 mg by mouth daily.    . metoprolol succinate (TOPROL-XL) 100 MG 24 hr tablet Take 100 mg by mouth daily. Take with or immediately following a meal.    . mirtazapine (REMERON SOL-TAB) 15 MG disintegrating tablet Take 15 mg by mouth at bedtime.    . donepezil (ARICEPT) 10 MG tablet Take 1 tablet (10 mg total) by mouth at bedtime. (Patient not  taking: Reported on 06/13/2017) 30 tablet 12  . memantine (NAMENDA) 5 MG tablet Take 1 tablet (5 mg total) by mouth 2 (two) times daily. (Patient not taking: Reported on 06/13/2017) 60 tablet 11  . QUEtiapine (SEROQUEL) 25 MG tablet Take 1 tablet (25 mg total) by mouth at bedtime. (Patient not taking: Reported on 06/13/2017) 30 tablet 2   No current facility-administered medications for this visit.     Allergies as of 08/31/2017  . (No Known Allergies)    Vitals: Ht 6\' 3"  (1.905 m)   Wt 184 lb (83.5 kg)   BMI 23.00 kg/m  Last Weight:  Wt Readings from Last 1 Encounters:  08/31/17 184 lb (83.5 kg)   Last Height:   Ht Readings from Last 1 Encounters:  08/31/17 6\' 3"  (1.905 m)    MMSE - Mini Mental State Exam 08/31/2017  Orientation to time 1  Orientation to Place 3  Registration 3  Attention/ Calculation 0  Recall 1  Language- name 2 objects 2  Language- repeat 0  Language- follow 3 step command 2  Language- follow 3 step command-comments took paper in L hand  Language- read & follow direction 1  Write a sentence 0  Write a sentence-comments refused  Copy design 0  Copy design-comments refused  Total score 13        Assessment/Plan:  Physical exam: Exam: Gen: NAD Eyes: Conjunctivae clear without exudates or hemorrhage  Neuro: Detailed Neurologic Exam  Speech:    Speech is normal; fluent and spontaneous with normal comprehension.  Cognition:  .mmse    The patient is oriented to person only    recent and remote memory impaired;     language fluent;     Impaired attention, concentration,     fund of knowledge impaired Cranial Nerves:    The pupils are equal, round, and reactive to light.  Visual fields are full to finger confrontation. Extraocular movements are intact. Trigeminal sensation is intact and the muscles of mastication are normal. The face is symmetric. The palate elevates in the midline. Hearing intact. Voice is normal. Shoulder shrug is  normal. The tongue has normal motion without fasciculations.   Coordination:    Normal finger to nose and heel to shin. Normal rapid  alternating movements.   Gait:    Mildly stooped but good brisk gait, good turns, not ataxic.    Motor Observation:    No asymmetry, no atrophy, and no involuntary movements noted. Tone:    Normal muscle tone.       Strength:    Strength is V/V in the upper and lower limbs.      Sensation: intact to LT     Reflex Exam:  DTR's:    Deep tendon reflexes in the upper and lower extremities are normal bilaterally.      Assessment/Plan:  Mr.Philip L Lloydis a 77 y.o.malewith history of hypertension, dementia, prostate cancer,  seizures and strokes.   Seizure  Continue Keppra   Stroke: Continueplavix for stroke prevention, Patient counseled to be compliant with hisantithrombotic medications,  Ongoing aggressive stroke risk factor management  Alzheimer's dementia - They decline namenda and aricept. At this point I think these medications are limited in clinical value to this patient. Recommend Hospice palliative care to help since he is having more falls, significant cognitive and functional    Orders Placed This Encounter  Procedures  . Ambulatory referral to [place] [address] [place], [place] [address]  Phone [phone] Fax [phone]  A total of 25 minutes was spent face-to-face with this patient. Over half this time was spent on counseling patient on the dementia, stroke, seizure diagnosis and different diagnostic and therapeutic options available.

## 2017-08-31 NOTE — Patient Instructions (Signed)
Hospice Introduction Hospice is a service that is designed to provide people who are ill and declining and their families with medical, spiritual, and psychological support. Its aim is to improve your quality of life by keeping you as alert and comfortable as possible. Who will be my providers when I begin hospice care? Hospice teams often include:  A nurse.  A doctor. The hospice doctor will be available for your care, but you can bring your regular doctor or nurse practitioner.  Social workers.  Religious leaders (such as a Clinical biochemist).  Trained volunteers.  What roles will providers play in my care? Hospice is performed by a team of health care professionals and volunteers who:  Help keep you comfortable: ? Hospice can be provided in your home or in a homelike setting. ? The hospice staff works with your family and friends to help meet your needs. ? You will enjoy the support of loved ones by receiving much of your basic care from family and friends.  Provide pain relief and manage your symptoms. The staff supply all necessary medicines and equipment.  Provide companionship when you are alone.  Allow you and your family to rest. They may do light housekeeping, prepare meals, and run errands.  Provide counseling. They will make sure your emotional, spiritual, and social needs and those of your family are being met.  Provide spiritual care: ? Spiritual care will be individualized to meet your needs and your family's needs. ? Spiritual care may involve:  Helping you look at what death means to you.  Helping you say goodbye to your family and friends.  Performing a specific religious ceremony or ritual.  When should hospice care begin? Most people who use hospice are believed to have fewer than 6 months to live.  Your family and health care providers can help you decide when hospice services should begin.  If your condition improves, you may discontinue the  program.  What should I consider before selecting a program? Most hospice programs are run by nonprofit, independent organizations. Some are affiliated with hospitals, nursing homes, or home health care agencies. Hospice programs can take place in the home or at a hospice center, hospital, or skilled nursing facility. When choosing a hospice program, ask the following questions:  What services are available to me?  What services will be offered to my loved ones?  How involved will my loved ones be?  How involved will my health care provider be?  Who makes up the hospice care team? How are they trained or screened?  How will my pain and symptoms be managed?  If my circumstances change, can the services be provided in a different setting, such as my home or in the hospital?  Is the program reviewed and licensed by the state or certified in some other way?  Where can I learn more about hospice? You can learn about existing hospice programs in your area from your health care providers. You can also read more about hospice online. The websites of the following organizations contain helpful information:  The [facility] and Palliative Care Organization [facility]).  The Hospice Association of America ([place]).  The [place].  The American Cancer Society (ACS).  Hospice Net.  This information is not intended to replace advice given to you by your health care provider. Make sure you discuss any questions you have with your health care provider. Document Released: 11/06/2003 Document Revised: 03/05/2016 Document Reviewed: 05/30/2013 Elsevier Interactive Patient Education  2017 Elsevier  Inc.

## 2017-11-05 ENCOUNTER — Encounter: Payer: Self-pay | Admitting: Family Medicine

## 2017-11-05 ENCOUNTER — Other Ambulatory Visit: Payer: Self-pay | Admitting: Family Medicine

## 2017-11-05 ENCOUNTER — Ambulatory Visit (INDEPENDENT_AMBULATORY_CARE_PROVIDER_SITE_OTHER): Payer: Self-pay | Admitting: Family Medicine

## 2017-11-05 VITALS — BP 154/80 | HR 64 | Temp 98.3°F | Resp 16 | Ht 75.0 in | Wt 191.6 lb

## 2017-11-05 DIAGNOSIS — F0391 Unspecified dementia with behavioral disturbance: Secondary | ICD-10-CM

## 2017-11-05 DIAGNOSIS — N183 Chronic kidney disease, stage 3 unspecified: Secondary | ICD-10-CM

## 2017-11-05 DIAGNOSIS — R7989 Other specified abnormal findings of blood chemistry: Secondary | ICD-10-CM

## 2017-11-05 DIAGNOSIS — I10 Essential (primary) hypertension: Secondary | ICD-10-CM

## 2017-11-05 DIAGNOSIS — D509 Iron deficiency anemia, unspecified: Secondary | ICD-10-CM

## 2017-11-05 DIAGNOSIS — R7309 Other abnormal glucose: Secondary | ICD-10-CM

## 2017-11-05 DIAGNOSIS — Z515 Encounter for palliative care: Secondary | ICD-10-CM | POA: Insufficient documentation

## 2017-11-05 DIAGNOSIS — Z7689 Persons encountering health services in other specified circumstances: Secondary | ICD-10-CM

## 2017-11-05 DIAGNOSIS — E786 Lipoprotein deficiency: Secondary | ICD-10-CM

## 2017-11-05 DIAGNOSIS — Z8673 Personal history of transient ischemic attack (TIA), and cerebral infarction without residual deficits: Secondary | ICD-10-CM

## 2017-11-05 DIAGNOSIS — R569 Unspecified convulsions: Secondary | ICD-10-CM

## 2017-11-05 MED ORDER — LEVETIRACETAM 1000 MG PO TABS: 1000.0000 mg | ORAL_TABLET | Freq: Two times a day (BID) | ORAL | 0 refills | Status: DC

## 2017-11-05 NOTE — Patient Instructions (Addendum)
Thank you for coming to the office today.  Refilled Keppra 1000mg  twice daily for seizures   I will reach out to [name] to find out about this med, if she is comfortable with me refilling or if she would need to see him again for this issue.  Please call [facility] to check status of their next apt, recommend monthly or frequent check ins  Try to increase activity walking and just regular activity at home, improve cognitive function, try some puzzles and word games to get mind thinking other than watching TV  Continue other medicines at this time, let me know if you need a refill  Low TSH thyroid - this means may have HYPOTHYROIDISM - this can cause fatigue and reduced energy and feel cold  ?[facility] [address] [place], [place] [address]  Phone: [phone] [email or web address]  DUE for FASTING BLOOD WORK (no food or drink after midnight before the lab appointment, only water or coffee without cream/sugar on the morning of)  SCHEDULE "Lab Only" visit in the morning at the clinic for lab draw in 6 weeks   Please schedule a Follow-up Appointment to: Return in about 6 weeks (around 12/17/2017) for 6 weeks for fasting lab only then 3 months from now for Follow-up Thyroid, Dementia, Seizure.  If you have any other questions or concerns, please feel free to call the office or send a message through [facility]. You may also schedule an earlier appointment if necessary.  Additionally, you may be receiving a survey about your experience at our office within a few days to 1 week by e-mail or mail. We value your feedback.  Nobie Putnam, DO [place]

## 2017-11-05 NOTE — Progress Notes (Signed)
Subjective:    Patient ID: Philip Moreno, male    DOB: 1941/01/16, 77 y.o.   MRN: [id number]  Philip Moreno is a 77 y.o. male presenting on 11/05/2017 for Establish Care (Blood Pressure); Dementia; and Seizures  Previously followed by Dr Brynda Greathouse, until he has retired. He had established new PCP recently with Dr Barbaraann Boys ([place] in [place]).  History provided by wife, [name].  HPI   Seizures Followed by [facility] Dr Sarina Ill, initially seizures diagnosed in 2017, he was treated and ultimately he was seizure free for while, until recurrence. He has been on Keppra for while, does still get some mild breakthrough seizures by caregiver report. Last episode 2 weeks ago reported "mild seizure" while still on Keppra. - He is currently out of Keppra now for past 2 weeks, was taking 1000mg  BID, prior rx from Dr Brynda Greathouse and then most recently was rx by [name]. Last PCP did not plan to fill rx since it was originally written by Neurology. 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 Prior history of dementia dx in 2013, previously had CVAs in past, and had stays in SNF. He was on anti dementia meds in past with Aricept and Namenda, limited improvement, ultimately these were discontinued due to limited benefit. He was referred to Hospice by Neurology due to cognitive decline, but ultimately he was determined to not be candidate for hospice, instead admitted to Palliative Care. - For Dementia, he requires assistance with most ADLs, limited activity,  Mostly sedentary at home, watches TV, does not do many activities or walking indoor or outdoor, has some good days and some bad. He is very forgetful. Has some wandering behaviors around house but nothing that has caused him potential harm - Appetite is fairly good, sometimes better than others, takes some Ensure at times - Taking Mirtazapine for improved  appetite and weight gain and help sleep - Regarding history of stroke, and also history of blood clots with DVT and PE, he has been on anticoagulation, currently Eliquis, tolerating well, however he does have history of recurrent falls  CHRONIC HTN: Reports history of elevated BP, not checking BP at home Current Meds - Lisinopril 30mg  daily, Metoprolol XL 100mg  daily   Reports good compliance, took meds today. Tolerating well, w/o complaints.   Health Maintenance:  Due for Pneumonia Vaccines - based on chart has never received before, now age 59. Will be due for Prevnar-13 at next convenience, will offer at next visit  Depression screen [facility] 2/9 11/05/2017  Decreased Interest 0  Down, Depressed, Hopeless 0  PHQ - 2 Score 0    Past Medical History:  Diagnosis Date  . Arthritis   . Cancer [facility]) 05/2011   prostate cancer, [facility]  . DVT (deep venous thrombosis) ([place])   . GERD (gastroesophageal reflux disease)   . Memory loss   . Pulmonary embolism ([place])   . TIA (transient ischemic attack)    Past Surgical History:  Procedure Laterality Date  . COLONOSCOPY WITH PROPOFOL N/A 02/27/2015   Procedure: COLONOSCOPY WITH PROPOFOL;  Surgeon: Christene Lye, MD;  Location: [facility] ENDOSCOPY;  Service: Endoscopy;  Laterality: N/A;  . LYMPHADENECTOMY  08/25/2012   Procedure: LYMPHADENECTOMY;  Surgeon: Dutch Gray, MD;  Location: [facility] ORS;  Service: Urology;  Laterality: Bilateral;  . PROSTATE SURGERY  2012  . ROBOT ASSISTED LAPAROSCOPIC RADICAL PROSTATECTOMY  08/25/2012  Procedure: ROBOTIC ASSISTED LAPAROSCOPIC RADICAL PROSTATECTOMY LEVEL 2;  Surgeon: Dutch Gray, MD;  Location: [facility] ORS;  Service: Urology;  Laterality: N/A;   Social History   Socioeconomic History  . Marital status: Married    Spouse name: Jamelle Haring   . Number of children: 0  . Years of education: 7  . Highest education level: Not on file  Occupational History  . Occupation: farmer   Social Needs  . Financial resource  strain: Not on file  . Food insecurity:    Worry: Not on file    Inability: Not on file  . Transportation needs:    Medical: Not on file    Non-medical: Not on file  Tobacco Use  . Smoking status: Never Smoker  . Smokeless tobacco: Current User    Types: Snuff, Chew  Substance and Sexual Activity  . Alcohol use: No    Alcohol/week: 0.0 oz  . Drug use: No  . Sexual activity: Not on file  Lifestyle  . Physical activity:    Days per week: Not on file    Minutes per session: Not on file  . Stress: Not on file  Relationships  . Social connections:    Talks on phone: Not on file    Gets together: Not on file    Attends religious service: Not on file    Active member of club or organization: Not on file    Attends meetings of clubs or organizations: Not on file    Relationship status: Not on file  . Intimate partner violence:    Fear of current or ex partner: Not on file    Emotionally abused: Not on file    Physically abused: Not on file    Forced sexual activity: Not on file  Other Topics Concern  . Not on file  Social History Narrative   Patient lives at home with his wife    Patient is a part time farmer    Patient has a 7th grade education.    Patient has no children.    Caffeine use: daily   Right handed   Family History  Problem Relation Age of Onset  . Hypertension Mother   . Heart attack Father   . Cancer Brother   . Heart failure Brother   . Diabetes Brother    Current Outpatient Medications on File Prior to Visit  Medication Sig  . apixaban (ELIQUIS) 5 MG TABS tablet Take 1 tablet (5 mg total) 2 (two) times daily by mouth.  . feeding supplement, ENSURE ENLIVE, (ENSURE ENLIVE) LIQD Take 237 mLs 3 (three) times daily between meals by mouth.  Marland Kitchen lisinopril (PRINIVIL,ZESTRIL) 30 MG tablet Take 30 mg by mouth daily.  . metoprolol succinate (TOPROL-XL) 100 MG 24 hr tablet Take 100 mg by mouth daily. Take with or immediately following a meal.  . mirtazapine  (REMERON SOL-TAB) 15 MG disintegrating tablet Take 15 mg by mouth at bedtime.   No current facility-administered medications on file prior to visit.     Review of Systems  Constitutional: Positive for activity change (reduced activity). Negative for appetite change, chills, diaphoresis, fatigue, fever and unexpected weight change.  HENT: Negative for congestion and hearing loss.   Eyes: Negative for visual disturbance.  Respiratory: Negative for apnea, cough, choking, chest tightness, shortness of breath and wheezing.   Cardiovascular: Negative for chest pain, palpitations and leg swelling.  Gastrointestinal: Negative for abdominal pain, anal bleeding, blood in stool, constipation, diarrhea, nausea and vomiting.  Endocrine:  Positive for cold intolerance.  Genitourinary: Negative for decreased urine volume, difficulty urinating, dysuria, frequency, hematuria and urgency.       Nocturia  Musculoskeletal: Negative for arthralgias, back pain and neck pain.  Skin: Negative for rash.  Allergic/Immunologic: Negative for environmental allergies.  Neurological: Negative for dizziness, weakness, light-headedness, numbness and headaches.  Hematological: Negative for adenopathy.  Psychiatric/Behavioral: Positive for confusion (intermittent). Negative for agitation, behavioral problems, decreased concentration, dysphoric mood, hallucinations and sleep disturbance. The patient is not nervous/anxious.    Per HPI unless specifically indicated above     Objective:    BP (!) 154/80 (BP Location: Left Arm, Cuff Size: Normal)   Pulse 64   Temp 98.3 F (36.8 C) (Oral)   Resp 16   Ht 6\' 3"  (1.905 m)   Wt 191 lb 9.6 oz (86.9 kg)   BMI 23.95 kg/m   Wt Readings from Last 3 Encounters:  11/05/17 191 lb 9.6 oz (86.9 kg)  08/31/17 184 lb (83.5 kg)  06/15/17 177 lb 1.6 oz (80.3 kg)    Physical Exam  Constitutional: He appears well-developed and well-nourished. No distress.  Well-appearing, comfortable,  cooperative  HENT:  Head: Normocephalic and atraumatic.  Eyes: Conjunctivae are normal. Right eye exhibits no discharge. Left eye exhibits no discharge.  Neck: Normal range of motion. Neck supple. No thyromegaly present.  Cardiovascular: Normal rate, regular rhythm, normal heart sounds and intact distal pulses.  No murmur heard. Pulmonary/Chest: Effort normal and breath sounds normal. No respiratory distress. He has no wheezes. He has no rales.  Musculoskeletal: Normal range of motion. He exhibits no edema.  Lymphadenopathy:    He has no cervical adenopathy.  Neurological: He is alert.  Skin: Skin is warm and dry. No rash noted. He is not diaphoretic. No erythema.  Psychiatric: His behavior is normal.  Well groomed, eye contact is somewhat poor and intermittent, has significantly reduced amount of speech, will only respond with brief answers if asked direct question otherwise does not speak, appears to be passive and has somewhat flat affect  Nursing note and vitals reviewed.  Results for orders placed or performed during the hospital encounter of 06/13/17  Culture, blood (routine x 2)  Result Value Ref Range   Specimen Description BLOOD LEFT ARM    Special Requests      BOTTLES DRAWN AEROBIC AND ANAEROBIC Blood Culture adequate volume   Culture NO GROWTH 5 DAYS    Report Status 06/18/2017 FINAL   Culture, blood (routine x 2)  Result Value Ref Range   Specimen Description BLOOD RIGHT ARM    Special Requests      BOTTLES DRAWN AEROBIC AND ANAEROBIC Blood Culture adequate volume   Culture NO GROWTH 5 DAYS    Report Status 06/18/2017 FINAL   MRSA PCR Screening  Result Value Ref Range   MRSA by PCR NEGATIVE NEGATIVE  Comprehensive metabolic panel  Result Value Ref Range   Sodium 139 135 - 145 mmol/L   Potassium 3.0 (L) 3.5 - 5.1 mmol/L   Chloride 105 101 - 111 mmol/L   CO2 22 22 - 32 mmol/L   Glucose, Bld 122 (H) 65 - 99 mg/dL   BUN 22 (H) 6 - 20 mg/dL   Creatinine, Ser 1.53  (H) 0.61 - 1.24 mg/dL   Calcium 9.1 8.9 - 10.3 mg/dL   Total Protein 8.4 (H) 6.5 - 8.1 g/dL   Albumin 3.7 3.5 - 5.0 g/dL   AST 25 15 - 41 U/L   ALT  13 (L) 17 - 63 U/L   Alkaline Phosphatase 44 38 - 126 U/L   Total Bilirubin 1.2 0.3 - 1.2 mg/dL   GFR calc non Af Amer 43 (L) >60 mL/min   GFR calc Af Amer 50 (L) >60 mL/min   Anion gap 12 5 - 15  Troponin I  Result Value Ref Range   Troponin I 0.09 (HH) <0.03 ng/mL  Lactic acid, plasma  Result Value Ref Range   Lactic Acid, Venous 1.7 0.5 - 1.9 mmol/L  CBC with Differential  Result Value Ref Range   WBC 10.5 3.8 - 10.6 K/uL   RBC 5.69 4.40 - 5.90 MIL/uL   Hemoglobin 14.6 13.0 - 18.0 g/dL   HCT 45.5 40.0 - 52.0 %   MCV 80.0 80.0 - 100.0 fL   MCH 25.7 (L) 26.0 - 34.0 pg   MCHC 32.2 32.0 - 36.0 g/dL   RDW 15.3 (H) 11.5 - 14.5 %   Platelets 190 150 - 440 K/uL   Neutrophils Relative % 76 %   Neutro Abs 7.9 (H) 1.4 - 6.5 K/uL   Lymphocytes Relative 12 %   Lymphs Abs 1.3 1.0 - 3.6 K/uL   Monocytes Relative 12 %   Monocytes Absolute 1.3 (H) 0.2 - 1.0 K/uL   Eosinophils Relative 0 %   Eosinophils Absolute 0.0 0 - 0.7 K/uL   Basophils Relative 0 %   Basophils Absolute 0.0 0 - 0.1 K/uL  Protime-INR  Result Value Ref Range   Prothrombin Time 14.7 11.4 - 15.2 seconds   INR 1.16   APTT  Result Value Ref Range   aPTT 33 24 - 36 seconds  Heparin level (unfractionated)  Result Value Ref Range   Heparin Unfractionated 0.32 0.30 - 0.70 IU/mL  Troponin I  Result Value Ref Range   Troponin I 0.08 (HH) <0.03 ng/mL  Troponin I  Result Value Ref Range   Troponin I 0.09 (HH) <0.03 ng/mL  Troponin I  Result Value Ref Range   Troponin I 0.07 (HH) <0.03 ng/mL  Glucose, capillary  Result Value Ref Range   Glucose-Capillary 114 (H) 65 - 99 mg/dL  Comprehensive metabolic panel  Result Value Ref Range   Sodium 136 135 - 145 mmol/L   Potassium 2.8 (L) 3.5 - 5.1 mmol/L   Chloride 107 101 - 111 mmol/L   CO2 21 (L) 22 - 32 mmol/L   Glucose,  Bld 101 (H) 65 - 99 mg/dL   BUN 19 6 - 20 mg/dL   Creatinine, Ser 1.42 (H) 0.61 - 1.24 mg/dL   Calcium 7.7 (L) 8.9 - 10.3 mg/dL   Total Protein 6.4 (L) 6.5 - 8.1 g/dL   Albumin 2.8 (L) 3.5 - 5.0 g/dL   AST 20 15 - 41 U/L   ALT 12 (L) 17 - 63 U/L   Alkaline Phosphatase 30 (L) 38 - 126 U/L   Total Bilirubin 0.9 0.3 - 1.2 mg/dL   GFR calc non Af Amer 47 (L) >60 mL/min   GFR calc Af Amer 55 (L) >60 mL/min   Anion gap 8 5 - 15  CBC  Result Value Ref Range   WBC 8.2 3.8 - 10.6 K/uL   RBC 4.69 4.40 - 5.90 MIL/uL   Hemoglobin 12.1 (L) 13.0 - 18.0 g/dL   HCT 37.2 (L) 40.0 - 52.0 %   MCV 79.3 (L) 80.0 - 100.0 fL   MCH 25.8 (L) 26.0 - 34.0 pg   MCHC 32.5 32.0 - 36.0 g/dL  RDW 15.4 (H) 11.5 - 14.5 %   Platelets 164 150 - 440 K/uL  Magnesium  Result Value Ref Range   Magnesium 1.8 1.7 - 2.4 mg/dL  Phosphorus  Result Value Ref Range   Phosphorus 3.7 2.5 - 4.6 mg/dL  Heparin level (unfractionated)  Result Value Ref Range   Heparin Unfractionated 0.29 (L) 0.30 - 0.70 IU/mL  Heparin level (unfractionated)  Result Value Ref Range   Heparin Unfractionated 0.34 0.30 - 0.70 IU/mL  Basic metabolic panel  Result Value Ref Range   Sodium 136 135 - 145 mmol/L   Potassium 3.9 3.5 - 5.1 mmol/L   Chloride 106 101 - 111 mmol/L   CO2 22 22 - 32 mmol/L   Glucose, Bld 97 65 - 99 mg/dL   BUN 18 6 - 20 mg/dL   Creatinine, Ser 1.40 (H) 0.61 - 1.24 mg/dL   Calcium 8.0 (L) 8.9 - 10.3 mg/dL   GFR calc non Af Amer 48 (L) >60 mL/min   GFR calc Af Amer 56 (L) >60 mL/min   Anion gap 8 5 - 15  Procalcitonin - Baseline  Result Value Ref Range   Procalcitonin <0.10 ng/mL  Procalcitonin  Result Value Ref Range   Procalcitonin <0.10 ng/mL  Procalcitonin  Result Value Ref Range   Procalcitonin <0.10 ng/mL  Urinalysis, Routine w reflex microscopic  Result Value Ref Range   Color, Urine YELLOW (A) YELLOW   APPearance CLEAR (A) CLEAR   Specific Gravity, Urine 1.014 1.005 - 1.030   pH 6.0 5.0 - 8.0    Glucose, UA NEGATIVE NEGATIVE mg/dL   Hgb urine dipstick NEGATIVE NEGATIVE   Bilirubin Urine NEGATIVE NEGATIVE   Ketones, ur NEGATIVE NEGATIVE mg/dL   Protein, ur 30 (A) NEGATIVE mg/dL   Nitrite NEGATIVE NEGATIVE   Leukocytes, UA NEGATIVE NEGATIVE   RBC / HPF 0-5 0 - 5 RBC/hpf   WBC, UA 0-5 0 - 5 WBC/hpf   Bacteria, UA NONE SEEN NONE SEEN   Squamous Epithelial / LPF NONE SEEN NONE SEEN  CBC  Result Value Ref Range   WBC 8.6 3.8 - 10.6 K/uL   RBC 4.76 4.40 - 5.90 MIL/uL   Hemoglobin 12.3 (L) 13.0 - 18.0 g/dL   HCT 37.9 (L) 40.0 - 52.0 %   MCV 79.5 (L) 80.0 - 100.0 fL   MCH 25.9 (L) 26.0 - 34.0 pg   MCHC 32.6 32.0 - 36.0 g/dL   RDW 15.1 (H) 11.5 - 14.5 %   Platelets 209 150 - 440 K/uL  Basic metabolic panel  Result Value Ref Range   Sodium 134 (L) 135 - 145 mmol/L   Potassium 3.3 (L) 3.5 - 5.1 mmol/L   Chloride 101 101 - 111 mmol/L   CO2 22 22 - 32 mmol/L   Glucose, Bld 100 (H) 65 - 99 mg/dL   BUN 15 6 - 20 mg/dL   Creatinine, Ser 1.11 0.61 - 1.24 mg/dL   Calcium 8.7 (L) 8.9 - 10.3 mg/dL   GFR calc non Af Amer >60 >60 mL/min   GFR calc Af Amer >60 >60 mL/min   Anion gap 11 5 - 15      Assessment & Plan:   Problem List Items Addressed This Visit    CKD (chronic kidney disease) stage 3, GFR 30-59 ml/min (HCC)    Prior Cr fluctuation from 1.1 to 1.4 in past Due for repeat by next visit for Cr trend Goal to improve hydration      Dementia  Currently stable, with history of progressive dementia - considered likely vascular w/ CVAs but possible alzheimer's, clinically FAST Stage 6 (needs help with ADLs) he is very limited verbal but seems to have some good days with more verbal - Some history of behavioral disturbance without evidence of harm or danger to self/others.  Failed Aricept, Namenda Prior followed by [facility] [name] - had been referred to Hospice, since limited other interventions available  Plan: 1. Proceed with Palliative Care until meet criteria for  hospice - Encourage improve mental stimulation, activities, avoid persistent sedentary lifestyle      Relevant Medications   levETIRAcetam (KEPPRA) 1000 MG tablet   Elevated TSH    Recent elevated TSH >7 in 08/2017, prior readings fluctuate from 3-4 up to 10 on chart review No prior dx Hypothyroidism Suspect may be contributing to current poor energy and fatigue, may be affecting cognition as well Re-check TSH Free T4 in 6 weeks - if still elevated offered trial low dose Levothyroxine likely 70mcg daily      Essential hypertension    Elevated BP, improved on manual repeat Request home BP log Prefer to avoid med change on first visit Follow-up in several weeks, re-check may adjust if need      Relevant Medications   lisinopril (PRINIVIL,ZESTRIL) 30 MG tablet   History of embolic stroke    Prior embolic stroke, history of DVT/PE On anticoagulation Eliquis per Neurology      Palliative care patient    Followed by [place] for symptom management among other concerns. - Did not meet hospice criteria recently, his dementia seems to be FAST Stage 6, not quite stage 7  Recommend to continue f/u with Palliative monthly for symptom management and resources      Seizure ([place]) - Primary    Stable without recent seizure activity Last mild breakthrough 2 week ago Followed by [facility] [name] On Keppra 1000mg  BID - request refill now out x 2 week - Agreed to give temporary refill 1 month supply for now until can discuss with Neurology  Will contact [name] to determine if she recommends further Neurology follow-up in future for his seizures, and if they plans to refill this medication, or if we would take over since per patients spouse and chart, seems no further intervention offered / patient was hospice candidate      Relevant Medications   levETIRAcetam (KEPPRA) 1000 MG tablet    Other Visit Diagnoses    Encounter to establish care with new doctor            Meds ordered this encounter  Medications  . levETIRAcetam (KEPPRA) 1000 MG tablet    Sig: Take 1 tablet (1,000 mg total) by mouth 2 (two) times daily.    Dispense:  60 tablet    Refill:  0    Follow up plan: Return in about 6 weeks (around 12/17/2017) for 6 weeks for fasting lab only then 3 months from now for Follow-up Thyroid, Dementia, Seizure.  Future labs ordered for 12/17/17  Nobie Putnam, [facility] 11/06/2017, 12:20 AM

## 2017-11-06 ENCOUNTER — Encounter: Payer: Self-pay | Admitting: Family Medicine

## 2017-11-06 DIAGNOSIS — I129 Hypertensive chronic kidney disease with stage 1 through stage 4 chronic kidney disease, or unspecified chronic kidney disease: Secondary | ICD-10-CM | POA: Insufficient documentation

## 2017-11-06 DIAGNOSIS — E039 Hypothyroidism, unspecified: Secondary | ICD-10-CM | POA: Insufficient documentation

## 2017-11-06 DIAGNOSIS — N183 Chronic kidney disease, stage 3 (moderate): Secondary | ICD-10-CM

## 2017-11-06 NOTE — Assessment & Plan Note (Signed)
Followed by [place] - Palliative Care for symptom management among other concerns. - Did not meet hospice criteria recently, his dementia seems to be FAST Stage 6, not quite stage 7  Recommend to continue f/u with Palliative monthly for symptom management and resources 

## 2017-11-06 NOTE — Assessment & Plan Note (Signed)
Prior embolic stroke, history of DVT/PE On anticoagulation Eliquis per Neurology

## 2017-11-06 NOTE — Assessment & Plan Note (Signed)
Prior Cr fluctuation from 1.1 to 1.4 in past Due for repeat by next visit for Cr trend Goal to improve hydration

## 2017-11-06 NOTE — Assessment & Plan Note (Signed)
Recent elevated TSH >7 in 08/2017, prior readings fluctuate from 3-4 up to 10 on chart review No prior dx Hypothyroidism Suspect may be contributing to current poor energy and fatigue, may be affecting cognition as well Re-check TSH Free T4 in 6 weeks - if still elevated offered trial low dose Levothyroxine likely 106mcg daily

## 2017-11-06 NOTE — Assessment & Plan Note (Signed)
Elevated BP, improved on manual repeat Request home BP log Prefer to avoid med change on first visit Follow-up in several weeks, re-check may adjust if need

## 2017-11-06 NOTE — Assessment & Plan Note (Signed)
Currently stable, with history of progressive dementia - considered likely vascular w/ CVAs but possible alzheimer's, clinically FAST Stage 6 (needs help with ADLs) he is very limited verbal but seems to have some good days with more verbal - Some history of behavioral disturbance without evidence of harm or danger to self/others.  Failed Aricept, Namenda Prior followed by [facility] [name] - had been referred to Hospice, since limited other interventions available  Plan: 1. Proceed with Palliative Care until meet criteria for hospice - Encourage improve mental stimulation, activities, avoid persistent sedentary lifestyle

## 2017-11-06 NOTE — Assessment & Plan Note (Signed)
Stable without recent seizure activity Last mild breakthrough 2 week ago Followed by [facility] [name] On Keppra 1000mg  BID - request refill now out x 2 week - Agreed to give temporary refill 1 month supply for now until can discuss with Neurology  Will contact [name] to determine if she recommends further Neurology follow-up in future for his seizures, and if they plans to refill this medication, or if we would take over since per patients spouse and chart, seems no further intervention offered / patient was hospice candidate

## 2017-11-09 ENCOUNTER — Telehealth: Payer: Self-pay | Admitting: Family Medicine

## 2017-11-09 DIAGNOSIS — R569 Unspecified convulsions: Secondary | ICD-10-CM

## 2017-11-09 MED ORDER — LEVETIRACETAM 1000 MG PO TABS: 1000.0000 mg | ORAL_TABLET | Freq: Two times a day (BID) | ORAL | 5 refills | Status: DC

## 2017-11-09 NOTE — Telephone Encounter (Signed)
I received message back from Dr Sarina Ill from [facility] in [place] regarding this mutual patient. I just recently established with him on 11/05/17, we have agreed to rx the Keppra 1000mg  twice daily for preventing seizures for now for 1 month until I could talk to his prior Neurologist [name].  [name] is comfortable with me prescribing the [place] for him from now on as long as he does well on it and it is working. She does not have much else to offer him for his Dementia, therefore if they are okay with it, they do not need to return to see her for follow-up Neurology.  If we need her help in the future, she will be available for a follow-up appointment or to give [place] advice if we need it again.  -------------  Patient's daughter in law, Maudie Mercury called me back, and I advised her of the brief update on the [place] and Neurology, she will review this with wife, [name] further. I have sent refill Keppra to [place] 11/09/2017, 1:05 PM

## 2017-12-17 ENCOUNTER — Other Ambulatory Visit: Payer: Self-pay

## 2018-02-11 ENCOUNTER — Encounter: Payer: Self-pay | Admitting: Family Medicine

## 2018-02-11 ENCOUNTER — Ambulatory Visit (INDEPENDENT_AMBULATORY_CARE_PROVIDER_SITE_OTHER): Payer: Medicare Other | Admitting: Family Medicine

## 2018-02-11 VITALS — BP 162/94 | HR 58 | Temp 98.3°F | Resp 16 | Ht 75.0 in | Wt 194.0 lb

## 2018-02-11 DIAGNOSIS — R7989 Other specified abnormal findings of blood chemistry: Secondary | ICD-10-CM | POA: Diagnosis not present

## 2018-02-11 DIAGNOSIS — I10 Essential (primary) hypertension: Secondary | ICD-10-CM

## 2018-02-11 DIAGNOSIS — Z86711 Personal history of pulmonary embolism: Secondary | ICD-10-CM

## 2018-02-11 DIAGNOSIS — R7309 Other abnormal glucose: Secondary | ICD-10-CM

## 2018-02-11 DIAGNOSIS — N183 Chronic kidney disease, stage 3 unspecified: Secondary | ICD-10-CM

## 2018-02-11 DIAGNOSIS — E039 Hypothyroidism, unspecified: Secondary | ICD-10-CM | POA: Diagnosis not present

## 2018-02-11 DIAGNOSIS — R569 Unspecified convulsions: Secondary | ICD-10-CM

## 2018-02-11 DIAGNOSIS — F01518 Vascular dementia, unspecified severity, with other behavioral disturbance: Secondary | ICD-10-CM

## 2018-02-11 DIAGNOSIS — I4819 Other persistent atrial fibrillation: Secondary | ICD-10-CM

## 2018-02-11 DIAGNOSIS — G309 Alzheimer's disease, unspecified: Secondary | ICD-10-CM | POA: Diagnosis not present

## 2018-02-11 DIAGNOSIS — Z515 Encounter for palliative care: Secondary | ICD-10-CM | POA: Diagnosis not present

## 2018-02-11 DIAGNOSIS — I481 Persistent atrial fibrillation: Secondary | ICD-10-CM

## 2018-02-11 DIAGNOSIS — F0151 Vascular dementia with behavioral disturbance: Secondary | ICD-10-CM | POA: Diagnosis not present

## 2018-02-11 NOTE — Progress Notes (Signed)
Subjective:    Patient ID: Philip Moreno, male    DOB: 1941-06-05, 77 y.o.   MRN: [id number]  Philip Moreno is a 77 y.o. male presenting on 02/11/2018 for Seizures and Hypertension  History provided by patient, and additional significant history provided by wife, Philip Moreno.  He did not return to office for blood tests as advised about 6 weeks ago. 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 Will need next TSH. Has never been on levothyroxine treatment.  Seizures - Last visit with me 11/2017, for initial visit for same problem, previously followed by [name] at [facility] and managed on Keppra, this was a relatively recent dx 2017 and has been seizure free for long time only mild breakthrough symptoms, they were asking if I could rx Keppra instead of going to Neurology, spoke with Dr Henderson [place] in message and she agreed with plan and would only need to see patient if any significant worsening or change, see prior notes for background information. 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 Previously failed trials of medications w/ Aricept and Namenda without improvement. He was referred to Hospice by Neurology due to cognitive decline but did not meet criteria, has received Palliative Care. - His wife is his primary caregiver - For Dementia, he requires assistance with most ADLs, limited activity,  Mostly sedentary at home, watches TV, does not do many activities or walking indoor or outdoor, has some good days and some  bad. He is very forgetful. Has some wandering behaviors around house but nothing that has caused him potential harm - Appetite is fairly good, sometimes better than others, takes some Ensure at times - Taking Mirtazapine for improved appetite and weight gain and help sleep - Regarding history of stroke, and also history of blood clots with DVT and PE, he has been on anticoagulation, currently Eliquis, tolerating well, however he does have history of recurrent falls  CHRONIC HTN / Atrial Fibrillation (persistent) / CKD-III Reports history of elevated BP, not checking BP at home. Elevated today. Current Meds - Lisinopril 30mg  daily, Metoprolol XL 100mg  daily   Reports good compliance, took meds today. Tolerating well, w/o complaints. - He is on rate control and anticoagulation Eliquis - He admits poor diet, mostly sodas, not drinking much water, has some darker urine - he has history of gout, but still eating regular diet, high salty meats  Health Maintenance:  Due for Pneumonia Vaccines - based on chart has never received before, now age 45. He declines Prevnar-13 today, due to already getting blood drawn, does not like needles.   Depression screen [facility] 2/9 02/11/2018 11/05/2017  Decreased Interest 0 0  Down, Depressed, Hopeless 0 0  PHQ - 2 Score 0 0    Social History   Tobacco Use  . Smoking status: Never Smoker  . Smokeless tobacco: Current User    Types: Snuff, Chew  Substance Use Topics  . Alcohol use: No    Alcohol/week: 0.0 oz  . Drug use: No  Review of Systems Per HPI unless specifically indicated above     Objective:    BP (!) 162/94 (BP Location: Left Arm, Cuff Size: Normal)   Pulse (!) 58   Temp 98.3 F (36.8 C) (Oral)   Resp 16   Ht 6\' 3"  (1.905 m)   Wt 194 lb (88 kg)   BMI 24.25 kg/m   Wt Readings from Last 3 Encounters:  02/11/18 194 lb (88 kg)  11/05/17 191 lb 9.6 oz (86.9 kg)  08/31/17 184 lb (83.5 kg)    Physical Exam  Constitutional: He  appears well-developed and well-nourished. No distress.  Well-appearing, comfortable, cooperative  HENT:  Head: Normocephalic and atraumatic.  Eyes: Conjunctivae are normal. Right eye exhibits no discharge. Left eye exhibits no discharge.  Neck: Normal range of motion. Neck supple. No thyromegaly present.  Cardiovascular: Normal rate, normal heart sounds and intact distal pulses.  No murmur heard. Irregularly irregular  Pulmonary/Chest: Effort normal and breath sounds normal. No respiratory distress. He has no wheezes. He has no rales.  Musculoskeletal: Normal range of motion. He exhibits no edema.  Lymphadenopathy:    He has no cervical adenopathy.  Neurological: He is alert.  Skin: Skin is warm and dry. No rash noted. He is not diaphoretic. No erythema.  Psychiatric: His behavior is normal.  Well groomed, eye contact is somewhat poor and intermittent, has significantly reduced amount of speech, will only respond with brief answers if asked direct question otherwise does not speak, appears to be passive and has somewhat flat affect  Nursing note and vitals reviewed.  Results for orders placed or performed during the hospital encounter of 06/13/17  Culture, blood (routine x 2)  Result Value Ref Range   Specimen Description BLOOD LEFT ARM    Special Requests      BOTTLES DRAWN AEROBIC AND ANAEROBIC Blood Culture adequate volume   Culture NO GROWTH 5 DAYS    Report Status 06/18/2017 FINAL   Culture, blood (routine x 2)  Result Value Ref Range   Specimen Description BLOOD RIGHT ARM    Special Requests      BOTTLES DRAWN AEROBIC AND ANAEROBIC Blood Culture adequate volume   Culture NO GROWTH 5 DAYS    Report Status 06/18/2017 FINAL   MRSA PCR Screening  Result Value Ref Range   MRSA by PCR NEGATIVE NEGATIVE  Comprehensive metabolic panel  Result Value Ref Range   Sodium 139 135 - 145 mmol/L   Potassium 3.0 (L) 3.5 - 5.1 mmol/L   Chloride 105 101 - 111 mmol/L   CO2 22 22 - 32  mmol/L   Glucose, Bld 122 (H) 65 - 99 mg/dL   BUN 22 (H) 6 - 20 mg/dL   Creatinine, Ser 1.53 (H) 0.61 - 1.24 mg/dL   Calcium 9.1 8.9 - 10.3 mg/dL   Total Protein 8.4 (H) 6.5 - 8.1 g/dL   Albumin 3.7 3.5 - 5.0 g/dL   AST 25 15 - 41 U/L   ALT 13 (L) 17 - 63 U/L   Alkaline Phosphatase 44 38 - 126 U/L   Total Bilirubin 1.2 0.3 - 1.2 mg/dL   GFR calc non Af Amer 43 (L) >60 mL/min   GFR calc Af Amer 50 (L) >60 mL/min   Anion gap 12 5 - 15  Troponin I  Result Value Ref Range   Troponin I 0.09 (HH) <0.03 ng/mL  Lactic acid, plasma  Result Value Ref Range   Lactic Acid, Venous 1.7 0.5 - 1.9  mmol/L  CBC with Differential  Result Value Ref Range   WBC 10.5 3.8 - 10.6 K/uL   RBC 5.69 4.40 - 5.90 MIL/uL   Hemoglobin 14.6 13.0 - 18.0 g/dL   HCT 45.5 40.0 - 52.0 %   MCV 80.0 80.0 - 100.0 fL   MCH 25.7 (L) 26.0 - 34.0 pg   MCHC 32.2 32.0 - 36.0 g/dL   RDW 15.3 (H) 11.5 - 14.5 %   Platelets 190 150 - 440 K/uL   Neutrophils Relative % 76 %   Neutro Abs 7.9 (H) 1.4 - 6.5 K/uL   Lymphocytes Relative 12 %   Lymphs Abs 1.3 1.0 - 3.6 K/uL   Monocytes Relative 12 %   Monocytes Absolute 1.3 (H) 0.2 - 1.0 K/uL   Eosinophils Relative 0 %   Eosinophils Absolute 0.0 0 - 0.7 K/uL   Basophils Relative 0 %   Basophils Absolute 0.0 0 - 0.1 K/uL  Protime-INR  Result Value Ref Range   Prothrombin Time 14.7 11.4 - 15.2 seconds   INR 1.16   APTT  Result Value Ref Range   aPTT 33 24 - 36 seconds  Heparin level (unfractionated)  Result Value Ref Range   Heparin Unfractionated 0.32 0.30 - 0.70 IU/mL  Troponin I  Result Value Ref Range   Troponin I 0.08 (HH) <0.03 ng/mL  Troponin I  Result Value Ref Range   Troponin I 0.09 (HH) <0.03 ng/mL  Troponin I  Result Value Ref Range   Troponin I 0.07 (HH) <0.03 ng/mL  Glucose, capillary  Result Value Ref Range   Glucose-Capillary 114 (H) 65 - 99 mg/dL  Comprehensive metabolic panel  Result Value Ref Range   Sodium 136 135 - 145 mmol/L   Potassium  2.8 (L) 3.5 - 5.1 mmol/L   Chloride 107 101 - 111 mmol/L   CO2 21 (L) 22 - 32 mmol/L   Glucose, Bld 101 (H) 65 - 99 mg/dL   BUN 19 6 - 20 mg/dL   Creatinine, Ser 1.42 (H) 0.61 - 1.24 mg/dL   Calcium 7.7 (L) 8.9 - 10.3 mg/dL   Total Protein 6.4 (L) 6.5 - 8.1 g/dL   Albumin 2.8 (L) 3.5 - 5.0 g/dL   AST 20 15 - 41 U/L   ALT 12 (L) 17 - 63 U/L   Alkaline Phosphatase 30 (L) 38 - 126 U/L   Total Bilirubin 0.9 0.3 - 1.2 mg/dL   GFR calc non Af Amer 47 (L) >60 mL/min   GFR calc Af Amer 55 (L) >60 mL/min   Anion gap 8 5 - 15  CBC  Result Value Ref Range   WBC 8.2 3.8 - 10.6 K/uL   RBC 4.69 4.40 - 5.90 MIL/uL   Hemoglobin 12.1 (L) 13.0 - 18.0 g/dL   HCT 37.2 (L) 40.0 - 52.0 %   MCV 79.3 (L) 80.0 - 100.0 fL   MCH 25.8 (L) 26.0 - 34.0 pg   MCHC 32.5 32.0 - 36.0 g/dL   RDW 15.4 (H) 11.5 - 14.5 %   Platelets 164 150 - 440 K/uL  Magnesium  Result Value Ref Range   Magnesium 1.8 1.7 - 2.4 mg/dL  Phosphorus  Result Value Ref Range   Phosphorus 3.7 2.5 - 4.6 mg/dL  Heparin level (unfractionated)  Result Value Ref Range   Heparin Unfractionated 0.29 (L) 0.30 - 0.70 IU/mL  Heparin level (unfractionated)  Result Value Ref Range   Heparin Unfractionated 0.34 0.30 - 0.70 IU/mL  Basic metabolic  panel  Result Value Ref Range   Sodium 136 135 - 145 mmol/L   Potassium 3.9 3.5 - 5.1 mmol/L   Chloride 106 101 - 111 mmol/L   CO2 22 22 - 32 mmol/L   Glucose, Bld 97 65 - 99 mg/dL   BUN 18 6 - 20 mg/dL   Creatinine, Ser 1.40 (H) 0.61 - 1.24 mg/dL   Calcium 8.0 (L) 8.9 - 10.3 mg/dL   GFR calc non Af Amer 48 (L) >60 mL/min   GFR calc Af Amer 56 (L) >60 mL/min   Anion gap 8 5 - 15  Procalcitonin - Baseline  Result Value Ref Range   Procalcitonin <0.10 ng/mL  Procalcitonin  Result Value Ref Range   Procalcitonin <0.10 ng/mL  Procalcitonin  Result Value Ref Range   Procalcitonin <0.10 ng/mL  Urinalysis, Routine w reflex microscopic  Result Value Ref Range   Color, Urine YELLOW (A) YELLOW    APPearance CLEAR (A) CLEAR   Specific Gravity, Urine 1.014 1.005 - 1.030   pH 6.0 5.0 - 8.0   Glucose, UA NEGATIVE NEGATIVE mg/dL   Hgb urine dipstick NEGATIVE NEGATIVE   Bilirubin Urine NEGATIVE NEGATIVE   Ketones, ur NEGATIVE NEGATIVE mg/dL   Protein, ur 30 (A) NEGATIVE mg/dL   Nitrite NEGATIVE NEGATIVE   Leukocytes, UA NEGATIVE NEGATIVE   RBC / HPF 0-5 0 - 5 RBC/hpf   WBC, UA 0-5 0 - 5 WBC/hpf   Bacteria, UA NONE SEEN NONE SEEN   Squamous Epithelial / LPF NONE SEEN NONE SEEN  CBC  Result Value Ref Range   WBC 8.6 3.8 - 10.6 K/uL   RBC 4.76 4.40 - 5.90 MIL/uL   Hemoglobin 12.3 (L) 13.0 - 18.0 g/dL   HCT 37.9 (L) 40.0 - 52.0 %   MCV 79.5 (L) 80.0 - 100.0 fL   MCH 25.9 (L) 26.0 - 34.0 pg   MCHC 32.6 32.0 - 36.0 g/dL   RDW 15.1 (H) 11.5 - 14.5 %   Platelets 209 150 - 440 K/uL  Basic metabolic panel  Result Value Ref Range   Sodium 134 (L) 135 - 145 mmol/L   Potassium 3.3 (L) 3.5 - 5.1 mmol/L   Chloride 101 101 - 111 mmol/L   CO2 22 22 - 32 mmol/L   Glucose, Bld 100 (H) 65 - 99 mg/dL   BUN 15 6 - 20 mg/dL   Creatinine, Ser 1.11 0.61 - 1.24 mg/dL   Calcium 8.7 (L) 8.9 - 10.3 mg/dL   GFR calc non Af Amer >60 >60 mL/min   GFR calc Af Amer >60 >60 mL/min   Anion gap 11 5 - 15      Assessment & Plan:   Problem List Items Addressed This Visit    Atrial fibrillation, persistent (HCC)    Stable chronic AFib On rate control Metoprolol and anticoagulation Eliquis Advised to check Eliquis bottle at home to see prescriber's name on it, they should check with pharmacy to have request for refills sent to our office      CKD (chronic kidney disease) stage 3, GFR 30-59 ml/min (HCC)    Concern some mild dehydration with poor water intake, drinks mostly soda Prior Cr 1.1 to 1.4 Due to check chemistry Cr today Emphasis improve hydration with more water      Relevant Orders   COMPLETE METABOLIC PANEL WITH GFR   Elevated TSH - Primary    Prior elevated TSH with fluctuation 3 to 10  then last 7 (08/2017)  Overdue to check TSH Never dx hypothyroidism, not on levothyroxine in past  Check TSH Free T4 today - if abnormal will notify patient and likely start low dose Levothyroxine      Relevant Orders   TSH   T4, free   Essential hypertension    Significantly elevated initial BP, repeat manual check improved but still high SBP >160, concern w/ AFib may have abnormal reading. - Home BP readings none available - can start checking   Complication with CKD-III, Vascular Dementia   Plan:  1. Continue current BP regimen - Lisinopril 30mg  daily, Metoprolol XL 100mg  daily 2. Encourage improved lifestyle - emphasis on low sodium diet, regular exercise 3. Start monitor BP outside office, bring readings to next visit, if persistently >150/90 or new symptoms notify office sooner 4. Follow-up 3 months - may consider add Amlodipine at future visit if still elevated BP       Relevant Orders   CBC with Differential/Platelet   COMPLETE METABOLIC PANEL WITH GFR   History of pulmonary embolus (PE)    On anticoagulation Eliquis      Palliative care patient    Followed by [facility] for symptom management among other concerns. - Did not meet hospice criteria recently, his dementia seems to be FAST Stage 6, not quite stage 7  Recommend to continue f/u with Palliative monthly for symptom management and resources      Seizure ([place])    Stable without recent seizure activity No longer followed by [facility] [name] - see note, agreed for [place] to manage seizures unless significant clinical change then he may return  Plan - Continue on Keppra 1000mg  BID      Relevant Orders   COMPLETE METABOLIC PANEL WITH GFR   Vascular dementia with behavioral disturbance    Currently stable, with history of progressive dementia - suspected vascular w/ CVAs but possible alzheimer's, clinically FAST Stage 6 (needs help with ADLs) he is very limited verbal but seems to have some good  days with more verbal - Some history of behavioral disturbance without evidence of harm or danger to self/others.  Failed Aricept, Namenda Prior followed by [facility] [name]. Now followed by [place] [facility]  Plan: 1.  - Continue with Palliative Care until meet criteria for hospice - encouraged them to check in with palliative and call for updates, especially if worsening - Encourage improve mental stimulation, activities, avoid persistent sedentary lifestyle      Relevant Orders   CBC with Differential/Platelet   COMPLETE METABOLIC PANEL WITH GFR   TSH    Other Visit Diagnoses    Abnormal glucose       Relevant Orders   Hemoglobin A1c      No orders of the defined types were placed in this encounter.   Follow up plan: Return in about 3 months (around 05/14/2018) for HTN, Dementia, Seizures, ?TSH.  Nobie Putnam, [facility] 02/11/2018, 9:01 AM

## 2018-02-11 NOTE — Assessment & Plan Note (Signed)
Concern some mild dehydration with poor water intake, drinks mostly soda Prior Cr 1.1 to 1.4 Due to check chemistry Cr today Emphasis improve hydration with more water

## 2018-02-11 NOTE — Assessment & Plan Note (Signed)
Stable chronic AFib On rate control Metoprolol and anticoagulation Eliquis Advised to check Eliquis bottle at home to see prescriber's name on it, they should check with pharmacy to have request for refills sent to our office

## 2018-02-11 NOTE — Assessment & Plan Note (Signed)
Currently stable, with history of progressive dementia - suspected vascular w/ CVAs but possible alzheimer's, clinically FAST Stage 6 (needs help with ADLs) he is very limited verbal but seems to have some good days with more verbal - Some history of behavioral disturbance without evidence of harm or danger to self/others.  Failed Aricept, Namenda Prior followed by [facility] [name]. Now followed by [place] [facility]  Plan: 1.  - Continue with Palliative Care until meet criteria for hospice - encouraged them to check in with palliative and call for updates, especially if worsening - Encourage improve mental stimulation, activities, avoid persistent sedentary lifestyle

## 2018-02-11 NOTE — Assessment & Plan Note (Signed)
Followed by [facility] for symptom management among other concerns. - Did not meet hospice criteria recently, his dementia seems to be FAST Stage 6, not quite stage 7  Recommend to continue f/u with Palliative monthly for symptom management and resources

## 2018-02-11 NOTE — Patient Instructions (Addendum)
Thank you for coming to the office today.  Will check labs today - stay tuned for results next week. If elevated TSH - means low thyroid hormone, will need to send rx Levothyroxine  Also will check kidney function and sugar  Check Blood Pressure at home and if elevated consistently >160/90, call office and we may need to add or change BP medication.  Continue other meds  Check bottles to see Dr's name on there, if it is previous Dr Janene Harvey, you will need to contact pharmacy to have them send [place] refill request next time so we can change this  I agree - most important dietary change is more hydration, less soda.  Our goal is to prevent future gout flares. Try to avoid dietary triggers that are the most common causes of gout flares. - Avoid the following foods/drinks: - Red meat, organ meat (liver) - Alcohol (especially beer, also wine, liquor) - Processed foods / carbs (white bread, white rice, pasta, sugar) - Sugary drinks (sweet tea, soda) - Shellfish, shrimp / lobster  - Foods that are preferred to eat: - Water - Beans, Lentils, Whole grains, Quinoa - Fruits, Vegetables - Dairy, Cheese, Yogurt - Soy based protein  DUE for BLOOD WORK TODAY   Please schedule a Follow-up Appointment to: Return in about 3 months (around 05/14/2018) for HTN, Dementia, Seizures, ?TSH.  If you have any other questions or concerns, please feel free to call the office or send a message through [place]. You may also schedule an earlier appointment if necessary.  Additionally, you may be receiving a survey about your experience at our office within a few days to 1 week by e-mail or mail. We value your feedback.  Nobie Putnam, DO [place]

## 2018-02-11 NOTE — Assessment & Plan Note (Signed)
Prior elevated TSH with fluctuation 3 to 10 then last 7 (08/2017) Overdue to check TSH Never dx hypothyroidism, not on levothyroxine in past  Check TSH Free T4 today - if abnormal will notify patient and likely start low dose Levothyroxine

## 2018-02-11 NOTE — Assessment & Plan Note (Signed)
Stable without recent seizure activity No longer followed by [facility] Dr Ahern - see note, agreed for us to manage seizures unless significant clinical change then he may return  Plan - Continue on Keppra 1000mg BID 

## 2018-02-11 NOTE — Assessment & Plan Note (Addendum)
Significantly elevated initial BP, repeat manual check improved but still high SBP >160, concern w/ AFib may have abnormal reading. - Home BP readings none available - can start checking   Complication with CKD-III, Vascular Dementia   Plan:  1. Continue current BP regimen - Lisinopril 30mg  daily, Metoprolol XL 100mg  daily 2. Encourage improved lifestyle - emphasis on low sodium diet, regular exercise 3. Start monitor BP outside office, bring readings to next visit, if persistently >150/90 or new symptoms notify office sooner 4. Follow-up 3 months - may consider add Amlodipine at future visit if still elevated BP

## 2018-02-11 NOTE — Assessment & Plan Note (Signed)
On anticoagulation Eliquis

## 2018-02-12 LAB — HEMOGLOBIN A1C
EAG (MMOL/L): 6.8 (calc)
Hgb A1c MFr Bld: 5.9 % of total Hgb — ABNORMAL HIGH (ref <5.7)
MEAN PLASMA GLUCOSE: 123 (calc)

## 2018-02-12 LAB — CBC WITH DIFFERENTIAL/PLATELET
Basophils Absolute: 40 cells/uL (ref 0–200)
Basophils Relative: 0.6 %
Eosinophils Absolute: 205 cells/uL (ref 15–500)
Eosinophils Relative: 3.1 %
HEMATOCRIT: 49.3 % (ref 38.5–50.0)
HEMOGLOBIN: 16.2 g/dL (ref 13.2–17.1)
LYMPHS ABS: 2244 {cells}/uL (ref 850–3900)
MCH: 26.9 pg — ABNORMAL LOW (ref 27.0–33.0)
MCHC: 32.9 g/dL (ref 32.0–36.0)
MCV: 81.8 fL (ref 80.0–100.0)
MPV: 12 fL (ref 7.5–12.5)
Monocytes Relative: 10.7 %
NEUTROS ABS: 3406 {cells}/uL (ref 1500–7800)
Neutrophils Relative %: 51.6 %
Platelets: 182 10*3/uL (ref 140–400)
RBC: 6.03 10*6/uL — AB (ref 4.20–5.80)
RDW: 14.2 % (ref 11.0–15.0)
Total Lymphocyte: 34 %
WBC mixed population: 706 cells/uL (ref 200–950)
WBC: 6.6 10*3/uL (ref 3.8–10.8)

## 2018-02-12 LAB — COMPLETE METABOLIC PANEL WITH GFR
AG RATIO: 1.2 (calc) (ref 1.0–2.5)
ALT: 32 U/L (ref 9–46)
AST: 24 U/L (ref 10–35)
Albumin: 4.3 g/dL (ref 3.6–5.1)
Alkaline phosphatase (APISO): 49 U/L (ref 40–115)
BUN / CREAT RATIO: 9 (calc) (ref 6–22)
BUN: 13 mg/dL (ref 7–25)
CHLORIDE: 107 mmol/L (ref 98–110)
CO2: 23 mmol/L (ref 20–32)
CREATININE: 1.47 mg/dL — AB (ref 0.70–1.18)
Calcium: 9.7 mg/dL (ref 8.6–10.3)
GFR, Est African American: 53 mL/min/{1.73_m2} — ABNORMAL LOW (ref >=60)
GFR, Est Non African American: 46 mL/min/{1.73_m2} — ABNORMAL LOW (ref >=60)
GLOBULIN: 3.6 g/dL (ref 1.9–3.7)
Glucose, Bld: 100 mg/dL — ABNORMAL HIGH (ref 65–99)
Potassium: 4.8 mmol/L (ref 3.5–5.3)
SODIUM: 142 mmol/L (ref 135–146)
Total Bilirubin: 0.6 mg/dL (ref 0.2–1.2)
Total Protein: 7.9 g/dL (ref 6.1–8.1)

## 2018-02-12 LAB — T4, FREE: FREE T4: 1 ng/dL (ref 0.8–1.8)

## 2018-02-12 LAB — TSH: TSH: 8.95 mIU/L — ABNORMAL HIGH (ref 0.40–4.50)

## 2018-02-14 ENCOUNTER — Encounter: Payer: Self-pay | Admitting: Family Medicine

## 2018-02-14 DIAGNOSIS — R7303 Prediabetes: Secondary | ICD-10-CM | POA: Insufficient documentation

## 2018-02-14 MED ORDER — LEVOTHYROXINE SODIUM 25 MCG PO TABS: 25.0000 ug | ORAL_TABLET | Freq: Every day | ORAL | 5 refills | Status: DC

## 2018-02-14 NOTE — Addendum Note (Signed)
Addended by: Olin Hauser on: 02/14/2018 06:30 PM   Modules accepted: Orders

## 2018-03-11 ENCOUNTER — Encounter: Payer: Self-pay | Admitting: Family Medicine

## 2018-03-11 ENCOUNTER — Ambulatory Visit (INDEPENDENT_AMBULATORY_CARE_PROVIDER_SITE_OTHER): Payer: Medicare Other | Admitting: Family Medicine

## 2018-03-11 VITALS — BP 156/100 | HR 58 | Temp 98.3°F | Resp 16 | Ht 75.0 in | Wt 194.0 lb

## 2018-03-11 DIAGNOSIS — B356 Tinea cruris: Secondary | ICD-10-CM | POA: Diagnosis not present

## 2018-03-11 MED ORDER — CLOTRIMAZOLE-BETAMETHASONE 1-0.05 % EX CREA: TOPICAL_CREAM | Freq: Two times a day (BID) | CUTANEOUS | 1 refills | Status: DC

## 2018-03-11 NOTE — Patient Instructions (Addendum)
Thank you for coming to the office today.  The rash looks most consistent with fungal infection - Tinea cruris, this can flare up and get worse due to a variety of factors usually moisture   Use the topical steroid creams twice a day for up to 1 week, maximum duration of use per one flare is 10 to 14 days, then STOP using it and allow skin to recover. Caution with over-use may cause lightening of the skin.   Do not use elsewhere on body in future if need to use can contact us for different cream  Tips to reduce Eczema Flares: For baths/showers, limit bathing to every other day if you can (max 1 x daily)  Use a gentle, unscented soap and lukewarm water (hot water is most irritating to skin) Never scrub skin with too much pressure, this causes more irritation. Pat skin dry, then leave it slightly damp. DO NOT scrub it dry. Apply steroid cream to skin and rub in all the way, wait 15 min, then apply a daily moisturizer OR BARRIER CREAM  If develops redness, honey colored crust oozing, drainage of pus, bleeding, or redness / swelling, pain, please return for re-evaluation, may have become infected after scratching.  Please schedule a Follow-up Appointment to: Return in about 2 weeks (around 03/25/2018), or if symptoms worsen or fail to improve, for tinea fungal rash.  If you have any other questions or concerns, please feel free to call the office or send a message through [place]. You may also schedule an earlier appointment if necessary.  Additionally, you may be receiving a survey about your experience at our office within a few days to 1 week by e-mail or mail. We value your feedback.  Nobie Putnam, DO [place]

## 2018-03-11 NOTE — Progress Notes (Signed)
Subjective:    Patient ID: Philip Moreno, male    DOB: 02/02/1941, 76 y.o.   MRN: [id number]  Philip Moreno is a 77 y.o. male presenting on 03/11/2018 for Rash (onset weeks)  Patient presents for a same day appointment. History provided primarily by patient's wife, Philip Moreno.  HPI   TINEA CRURIS / GROIN RASH Reports onset rash in groin bilateral about 4 weeks, with itching and redness irritated rash, denies pain. It has improved at times, in interval and would get dry and scaly, and then would heal and then eventually come back after scratching. They have used topical barrier cream and ointments (such as desitin) and also tried Vick's vapor rub with temporary relief Admits itching - Denies fevers, chills, nausea vomiting, no other rash, skin pain, redness   Depression screen [facility] 2/9 03/11/2018 02/11/2018 11/05/2017  Decreased Interest 0 0 0  Down, Depressed, Hopeless 0 0 0  PHQ - 2 Score 0 0 0    Social History   Tobacco Use  . Smoking status: Never Smoker  . Smokeless tobacco: Current User    Types: Snuff, Chew  Substance Use Topics  . Alcohol use: No    Alcohol/week: 0.0 standard drinks  . Drug use: No    Review of Systems Per HPI unless specifically indicated above     Objective:    BP (!) 156/100   Pulse (!) 58   Temp 98.3 F (36.8 C) (Oral)   Resp 16   Ht 6\' 3"  (1.905 m)   Wt 194 lb (88 kg)   BMI 24.25 kg/m   Wt Readings from Last 3 Encounters:  03/11/18 194 lb (88 kg)  02/11/18 194 lb (88 kg)  11/05/17 191 lb 9.6 oz (86.9 kg)    Physical Exam  Constitutional: He is oriented to person, place, and time. He appears well-developed and well-nourished. No distress.  Well-appearing, comfortable, cooperative  HENT:  Head: Normocephalic and atraumatic.  Mouth/Throat: Oropharynx is clear and moist.  Eyes: Conjunctivae are normal. Right eye exhibits no discharge. Left eye exhibits no discharge.  Cardiovascular: Normal rate.  Pulmonary/Chest: Effort normal.    Musculoskeletal: He exhibits no edema.  Neurological: He is alert and oriented to person, place, and time.  Skin: Skin is warm and dry. Rash (bilateral upper inner thigh groin skin folds with variable border rash consistent of tinea some dry flaky skin as well) noted. He is not diaphoretic. No erythema.  Increased moisture in groin skin  Psychiatric: He has a normal mood and affect. His behavior is normal.  Well groomed, good eye contact, normal speech and thoughts  Nursing note and vitals reviewed.  Results for orders placed or performed in visit on 02/11/18  Hemoglobin A1c  Result Value Ref Range   Hgb A1c MFr Bld 5.9 (H) <5.7 % of total Hgb   Mean Plasma Glucose 123 (calc)   eAG (mmol/L) 6.8 (calc)  CBC with Differential/Platelet  Result Value Ref Range   WBC 6.6 3.8 - 10.8 Thousand/uL   RBC 6.03 (H) 4.20 - 5.80 Million/uL   Hemoglobin 16.2 13.2 - 17.1 g/dL   HCT 49.3 38.5 - 50.0 %   MCV 81.8 80.0 - 100.0 fL   MCH 26.9 (L) 27.0 - 33.0 pg   MCHC 32.9 32.0 - 36.0 g/dL   RDW 14.2 11.0 - 15.0 %   Platelets 182 140 - 400 Thousand/uL   MPV 12.0 7.5 - 12.5 fL   Neutro Abs 3,406 1,500 - 7,800 cells/uL  Lymphs Abs 2,244 850 - 3,900 cells/uL   WBC mixed population 706 200 - 950 cells/uL   Eosinophils Absolute 205 15 - 500 cells/uL   Basophils Absolute 40 0 - 200 cells/uL   Neutrophils Relative % 51.6 %   Total Lymphocyte 34.0 %   Monocytes Relative 10.7 %   Eosinophils Relative 3.1 %   Basophils Relative 0.6 %  COMPLETE METABOLIC PANEL WITH GFR  Result Value Ref Range   Glucose, Bld 100 (H) 65 - 99 mg/dL   BUN 13 7 - 25 mg/dL   Creat 1.47 (H) 0.70 - 1.18 mg/dL   GFR, Est Non African American 46 (L) > OR = 60 mL/min/1.39m2   GFR, Est African American 53 (L) > OR = 60 mL/min/1.79m2   BUN/Creatinine Ratio 9 6 - 22 (calc)   Sodium 142 135 - 146 mmol/L   Potassium 4.8 3.5 - 5.3 mmol/L   Chloride 107 98 - 110 mmol/L   CO2 23 20 - 32 mmol/L   Calcium 9.7 8.6 - 10.3 mg/dL   Total  Protein 7.9 6.1 - 8.1 g/dL   Albumin 4.3 3.6 - 5.1 g/dL   Globulin 3.6 1.9 - 3.7 g/dL (calc)   AG Ratio 1.2 1.0 - 2.5 (calc)   Total Bilirubin 0.6 0.2 - 1.2 mg/dL   Alkaline phosphatase (APISO) 49 40 - 115 U/L   AST 24 10 - 35 U/L   ALT 32 9 - 46 U/L  TSH  Result Value Ref Range   TSH 8.95 (H) 0.40 - 4.50 mIU/L  T4, free  Result Value Ref Range   Free T4 1.0 0.8 - 1.8 ng/dL      Assessment & Plan:   Problem List Items Addressed This Visit    None    Visit Diagnoses    Tinea cruris    -  Primary   Relevant Medications   clotrimazole-betamethasone (LOTRISONE) cream      Clinically most consistent with tinea cruris on exam, without secondary infection or generalized spread - Trial of Clotrimazole-Betamethasone cream BID x 1-2 weeks, for double coverage for fungal tinea and also to reduce irritation and help itching to avoid recurrence - reduce moisture in this area, air dry - May still use barrier cream per AVS instructions - Follow-up if not improving, considered nystatin powder as well  Meds ordered this encounter  Medications  . clotrimazole-betamethasone (LOTRISONE) cream    Sig: Apply topically 2 (two) times daily. For up to 1-2 weeks, may re-use daily up to 1 week as needed.    Dispense:  45 g    Refill:  1    Follow up plan: Return in about 2 weeks (around 03/25/2018), or if symptoms worsen or fail to improve, for tinea fungal rash.  Nobie Putnam, [facility] 03/11/2018, 3:16 PM

## 2018-04-01 ENCOUNTER — Other Ambulatory Visit: Payer: Self-pay | Admitting: Family Medicine

## 2018-04-01 NOTE — Telephone Encounter (Signed)
Request received for Indocin 25mg  TID PRN for arthritis from pharmacy. He used to take this as prescribed by prior PCP Dr Brynda Greathouse. He has history of gout as well.  I reviewed his chart, we have not prescribe any NSAID for him like this medicine. I am concerned about his kidney function. I would not recommend taking this medication. At this time I will not refill this.  I would recommend high dose Tylenol  Tylenol Extra Strength 500mg  tabs - take 1 to 2 tabs per dose (max 1000mg ) every 6-8 hours for pain (take regularly, don't skip a dose for next 7 days), max 24 hour daily dose is 6 tablets or 3000mg   If he still needs this type of medicine, we would need to discuss at next appointment. May consider a Tramadol or low dose other pain medication in future that is safer for kidneys.  Nobie Putnam, DO [facility] 04/01/2018, 5:02 PM

## 2018-04-05 NOTE — Telephone Encounter (Signed)
The pt wife was notified. No questions or concerns.

## 2018-04-08 ENCOUNTER — Other Ambulatory Visit: Payer: Self-pay | Admitting: Family Medicine

## 2018-04-08 ENCOUNTER — Telehealth: Payer: Self-pay | Admitting: Family Medicine

## 2018-04-08 ENCOUNTER — Ambulatory Visit (INDEPENDENT_AMBULATORY_CARE_PROVIDER_SITE_OTHER): Payer: Medicare Other | Admitting: Family Medicine

## 2018-04-08 ENCOUNTER — Encounter: Payer: Self-pay | Admitting: Family Medicine

## 2018-04-08 VITALS — BP 146/91 | HR 68 | Temp 98.4°F | Resp 16 | Ht 75.0 in | Wt 194.0 lb

## 2018-04-08 DIAGNOSIS — M1A0710 Idiopathic chronic gout, right ankle and foot, without tophus (tophi): Secondary | ICD-10-CM

## 2018-04-08 DIAGNOSIS — I4819 Other persistent atrial fibrillation: Secondary | ICD-10-CM

## 2018-04-08 DIAGNOSIS — Z86711 Personal history of pulmonary embolism: Secondary | ICD-10-CM

## 2018-04-08 MED ORDER — COLCHICINE 0.6 MG PO TABS: 0.6000 mg | ORAL_TABLET | Freq: Every day | ORAL | 2 refills | Status: DC

## 2018-04-08 MED ORDER — ALLOPURINOL 100 MG PO TABS: 100.0000 mg | ORAL_TABLET | Freq: Every day | ORAL | 2 refills | Status: DC

## 2018-04-08 MED ORDER — APIXABAN 5 MG PO TABS: 5.0000 mg | ORAL_TABLET | Freq: Two times a day (BID) | ORAL | 5 refills | Status: DC

## 2018-04-08 NOTE — Assessment & Plan Note (Signed)
Clinically consistent with subacute gout flare of Right foot 1st MTP, onset 2 weeks ago now improved after NSAID prior rx on indocin and family member's colchicine trial. Known chronic history of gout, recurrent flare in this joint. 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 Not on preventative medication No uric acid level available  Plan: 1. 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

## 2018-04-08 NOTE — Progress Notes (Signed)
Subjective:    Patient ID: Philip Moreno, male    DOB: 03/22/1941, 77 y.o.   MRN: [id number]  Philip Moreno is a 77 y.o. male presenting on 04/08/2018 for Gout (onset 2 weeks can't walk or stand up since last night Right side)  Patient presents for a same day appointment. History provided by patient's wife, Philip Moreno.  HPI  GOUT, Flare - Right lower leg pain Reports recent episode onset 2 weeks ago with gout flare in Right leg, had pain and redness and swelling similar to prior to gout flares in Right foot. He took previous medicine Indocin 52m once daily with relief, he ran out of the old rx in past 1-2 weeks. He has history of elevated Creatinine to 1.47 in last test 01/2018. - Today he is not in significant pain but needs medicine for gout since previous med ran out. - No recent uric acid lab level on chart - Admits improved R leg and foot pain - Denies swelling, redness, erythema, fever chills, other joint pain or swelling  PMH CKD-III  [facility] 04/08/2018 03/11/2018 02/11/2018  Decreased Interest 0 0 0  Down, Depressed, Hopeless 0 0 0  PHQ - 2 Score 0 0 0    Social History   Tobacco Use  . Smoking status: Never Smoker  . Smokeless tobacco: Current User    Types: Snuff, Chew  Substance Use Topics  . Alcohol use: No    Alcohol/week: 0.0 standard drinks  . Drug use: No    Review of Systems Per HPI unless specifically indicated above     Objective:    BP (!) 146/91   Pulse 68   Temp 98.4 F (36.9 C) (Oral)   Resp 16   Ht _0  (1.905 m)   Wt 194 lb (88 kg)   BMI 24.25 kg/m   Wt Readings from Last 3 Encounters:  04/08/18 194 lb (88 kg)  03/11/18 194 lb (88 kg)  02/11/18 194 lb (88 kg)    Physical Exam  Constitutional: He appears well-developed and well-nourished. No distress.  Well-appearing, comfortable, cooperative  HENT:  Head: Normocephalic and atraumatic.  Cardiovascular: Normal rate, normal heart sounds and intact distal pulses.    Irregularly irregular  Pulmonary/Chest: Effort normal.  Musculoskeletal: Normal range of motion. He exhibits no edema.  Sitting in wheelchair  Right Lower Extremity / Foot Inspection: mostly normal appearance RLE lower lega nd calf normal without erythema or edema. R foot with prominent bunion with mild local erythema at 1st MTP otherwise no significant deformity, some mild residual swelling of R lateral ankle malleolus Palpation: non tender over R lower extremity, ankle and foot including 1st MTP ROM: Limited participation in exam but has intact ROM Strength: limited due to participation Neurovascular: distal intact   Neurological: He is alert.  Skin: Skin is warm and dry. No rash noted. He is not diaphoretic. No erythema.  Psychiatric: His behavior is normal.  Well groomed, eye contact is somewhat poor and intermittent, has significantly reduced amount of speech, will only respond with brief answers if asked direct question otherwise does not speak, appears to be passive and has somewhat flat affect  Nursing note and vitals reviewed.  Results for orders placed or performed in visit on 02/11/18  Hemoglobin A1c  Result Value Ref Range   Hgb A1c MFr Bld 5.9 (H) <5.7 % of total Hgb   Mean Plasma Glucose 123 (calc)   eAG (mmol/L) 6.8 (calc)  CBC with  Differential/Platelet  Result Value Ref Range   WBC 6.6 3.8 - 10.8 Thousand/uL   RBC 6.03 (H) 4.20 - 5.80 Million/uL   Hemoglobin 16.2 13.2 - 17.1 g/dL   HCT 49.3 38.5 - 50.0 %   MCV 81.8 80.0 - 100.0 fL   MCH 26.9 (L) 27.0 - 33.0 pg   MCHC 32.9 32.0 - 36.0 g/dL   RDW 14.2 11.0 - 15.0 %   Platelets 182 140 - 400 Thousand/uL   MPV 12.0 7.5 - 12.5 fL   Neutro Abs 3,406 1,500 - 7,800 cells/uL   Lymphs Abs 2,244 850 - 3,900 cells/uL   WBC mixed population 706 200 - 950 cells/uL   Eosinophils Absolute 205 15 - 500 cells/uL   Basophils Absolute 40 0 - 200 cells/uL   Neutrophils Relative % 51.6 %   Total Lymphocyte 34.0 %   Monocytes  Relative 10.7 %   Eosinophils Relative 3.1 %   Basophils Relative 0.6 %  COMPLETE METABOLIC PANEL WITH GFR  Result Value Ref Range   Glucose, Bld 100 (H) 65 - 99 mg/dL   BUN 13 7 - 25 mg/dL   Creat 1.47 (H) 0.70 - 1.18 mg/dL   GFR, Est Non African American 46 (L) > OR = 60 mL/min/1.9m   GFR, Est African American 53 (L) > OR = 60 mL/min/1.761m  BUN/Creatinine Ratio 9 6 - 22 (calc)   Sodium 142 135 - 146 mmol/L   Potassium 4.8 3.5 - 5.3 mmol/L   Chloride 107 98 - 110 mmol/L   CO2 23 20 - 32 mmol/L   Calcium 9.7 8.6 - 10.3 mg/dL   Total Protein 7.9 6.1 - 8.1 g/dL   Albumin 4.3 3.6 - 5.1 g/dL   Globulin 3.6 1.9 - 3.7 g/dL (calc)   AG Ratio 1.2 1.0 - 2.5 (calc)   Total Bilirubin 0.6 0.2 - 1.2 mg/dL   Alkaline phosphatase (APISO) 49 40 - 115 U/L   AST 24 10 - 35 U/L   ALT 32 9 - 46 U/L  TSH  Result Value Ref Range   TSH 8.95 (H) 0.40 - 4.50 mIU/L  T4, free  Result Value Ref Range   Free T4 1.0 0.8 - 1.8 ng/dL      Assessment & Plan:   Problem List Items Addressed This Visit    Chronic gout - Primary    Clinically consistent with subacute gout flare of Right foot 1st MTP, onset 2 weeks ago now improved after NSAID prior rx on indocin and family member's colchicine trial. Known chronic history of gout, recurrent flare in this joint. 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 Not on preventative medication No uric acid level available  Plan: 1. 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 would not increase dose beyond 100mg42mly - Considered prednisone but not in acute flare today - If need OTC med additional can consider low dose Naproxen 1-2 tabs 250mg 64mBID for max 3 days only - do not take longer and avoid indocin due to CKD - Avoid excessive ambulation, relative rest, ice if helps, can  take Tylenol PRN - Avoid food triggers per AVS  Follow-up as scheduled 4-6 weeks for routine visit and consider future Uric Acid testing if needed to monitor allopurinol therapy  Relevant Medications   colchicine 0.6 MG tablet   allopurinol (ZYLOPRIM) 100 MG tablet      Meds ordered this encounter  Medications  . colchicine 0.6 MG tablet    Sig: Take 1 tablet (0.6 mg total) by mouth daily.    Dispense:  30 tablet    Refill:  2  . allopurinol (ZYLOPRIM) 100 MG tablet    Sig: Take 1 tablet (100 mg total) by mouth daily.    Dispense:  30 tablet    Refill:  2    Follow up plan: Return if symptoms worsen or fail to improve, for gout.  Nobie Putnam, [facility] 04/08/2018, 3:03 PM

## 2018-04-08 NOTE — Telephone Encounter (Signed)
Requested refill eliquis  Nobie Putnam, DO [facility] 04/08/2018, 1:50 PM

## 2018-04-08 NOTE — Telephone Encounter (Signed)
Reviewed chart. I do not have gout as a past medical history on file. He does not have any gout medicine.  Also his kidney function is reduced. There is no safe OTC medication for gout for him to take without evaluation in office.  He will schedule office visit this afternoon.  Nobie Putnam, DO [facility] 04/08/2018, 10:22 AM

## 2018-04-08 NOTE — Telephone Encounter (Signed)
[name] said pt couldn't hardly walk because of gout.  She asked if there was anything her could take over the counter [phone]

## 2018-04-08 NOTE — Patient Instructions (Addendum)
Thank you for coming to the office today.  Your Right leg / foot pain is most likely caused an Gout Flare - Gout is a chronic problem that will have episodic flare ups with pain, redness, swelling of a joint, most common spots are big toe, foot and ankle, knee or sometimes hands or wrists. It is caused by small crystals made of Uric Acid that form in the joint causing pain and swelling.  Start Colchicine (Mitigare) today - take one daily to help prevent future problem for next 3 months  START Allopurinol 100mg  daily for preventing future gout. After 3 months we can STOP Colchicine and then use only as needed  If pain does not significantly improve within 48 to 72 hours, please notify us and we can send in a Prednisone burst instead (stop taking Colchicine or other anti-inflammatory), then once resolved, then start the preventative Colchicine dose of 1 pill a day for 1 month.  For all gout flares, the sooner you start the medication, then the shorter the flare lasts. Go ahead and start taking Naproxen OTC 250mg  take 1-2 pills up to twice a day for 3 days MAX - caution with these meds and his kidney function  Gout flares can repeat again soon after they resolve in the same spot or other joints, and may need repeat treatment.  Our goal is to prevent future gout flares. Try to avoid dietary triggers that are the most common causes of gout flares. - Avoid the following foods/drinks: - Red meat, organ meat (liver) - Alcohol (especially beer, also wine, liquor) - Processed foods / carbs (white bread, white rice, pasta, sugar) - Sugary drinks (sweet tea, soda) - Shellfish, shrimp / lobster  - Foods that are preferred to eat: - Beans, Lentils, Whole grains, Quinoa - Fruits, Vegetables - Dairy, Cheese, Yogurt - Soy based protein  We can do a blood test to check Uric Acid level, but the best way to confirm a diagnosis and help [place] determine the exact treatment you need is to drain the swelling and  analyze a sample of the fluid for crystals. We do not do this procedure here at our office, and would ask that you notify us or come in for evaluation and we can get you urgently scheduled with Orthopedics or Rheumatology office.   Please schedule a Follow-up Appointment to: Return if symptoms worsen or fail to improve, for gout.  If you have any other questions or concerns, please feel free to call the office or send a message through [facility]. You may also schedule an earlier appointment if necessary.  Additionally, you may be receiving a survey about your experience at our office within a few days to 1 week by e-mail or mail. We value your feedback.  Nobie Putnam, DO [facility]

## 2018-04-13 ENCOUNTER — Other Ambulatory Visit: Payer: Self-pay | Admitting: Family Medicine

## 2018-04-13 DIAGNOSIS — I10 Essential (primary) hypertension: Secondary | ICD-10-CM

## 2018-04-13 DIAGNOSIS — I4819 Other persistent atrial fibrillation: Secondary | ICD-10-CM

## 2018-04-13 MED ORDER — METOPROLOL SUCCINATE ER 100 MG PO TB24: 100.0000 mg | ORAL_TABLET | Freq: Every day | ORAL | 3 refills | Status: DC

## 2018-04-13 NOTE — Telephone Encounter (Signed)
Pt needs a refill on metoprolol sent to [facility].

## 2018-04-15 DIAGNOSIS — H01003 Unspecified blepharitis right eye, unspecified eyelid: Secondary | ICD-10-CM | POA: Diagnosis not present

## 2018-05-18 ENCOUNTER — Other Ambulatory Visit: Payer: Self-pay | Admitting: Family Medicine

## 2018-05-18 ENCOUNTER — Encounter: Payer: Self-pay | Admitting: Family Medicine

## 2018-05-18 ENCOUNTER — Ambulatory Visit (INDEPENDENT_AMBULATORY_CARE_PROVIDER_SITE_OTHER): Payer: Medicare Other | Admitting: Family Medicine

## 2018-05-18 VITALS — BP 152/84 | HR 51 | Temp 97.9°F | Resp 16 | Ht 75.0 in | Wt 193.0 lb

## 2018-05-18 DIAGNOSIS — N183 Chronic kidney disease, stage 3 unspecified: Secondary | ICD-10-CM

## 2018-05-18 DIAGNOSIS — F0151 Vascular dementia with behavioral disturbance: Secondary | ICD-10-CM

## 2018-05-18 DIAGNOSIS — E039 Hypothyroidism, unspecified: Secondary | ICD-10-CM

## 2018-05-18 DIAGNOSIS — Z515 Encounter for palliative care: Secondary | ICD-10-CM

## 2018-05-18 DIAGNOSIS — I4819 Other persistent atrial fibrillation: Secondary | ICD-10-CM

## 2018-05-18 DIAGNOSIS — R7303 Prediabetes: Secondary | ICD-10-CM

## 2018-05-18 DIAGNOSIS — I10 Essential (primary) hypertension: Secondary | ICD-10-CM | POA: Diagnosis not present

## 2018-05-18 DIAGNOSIS — Z0000 Encounter for general adult medical examination without abnormal findings: Secondary | ICD-10-CM

## 2018-05-18 DIAGNOSIS — F01518 Vascular dementia, unspecified severity, with other behavioral disturbance: Secondary | ICD-10-CM

## 2018-05-18 DIAGNOSIS — M1A0710 Idiopathic chronic gout, right ankle and foot, without tophus (tophi): Secondary | ICD-10-CM

## 2018-05-18 DIAGNOSIS — Z86711 Personal history of pulmonary embolism: Secondary | ICD-10-CM

## 2018-05-18 NOTE — Progress Notes (Signed)
Subjective:    Patient ID: Philip Moreno, male    DOB: Nov 14, 1940, 77 y.o.   MRN: [id number]  Philip Moreno is a 77 y.o. male presenting on 05/18/2018 for Hypertension  History provided by wife, [name]. Patient has Dementia and unable to provide significant history.  HPI   Elevated TSH / Presumed Hypothyroidism Last TSH 8.95 (01/2018). Has not had improved significantly after newly started on Levothyroxine 34mcg last visit 01/2018. He is not interested in lab test, declines blood draw. Family / caregiver prefer to continue current dose.  Seizures See prior note for background w/ neurology. He has done well on current dose Keppra 1000mg  BID, has not had breakthrough seizures, no new concerns. No longer wishes to f/u with Neurology [name] unless needed  Vascular Dementia with behavior disturbance / History of CVA with residual deficits New Additional update 2 weeks ago end of September, he seemed to have fairly sudden episode of feeling more sluggish and difficulty with moving at times still, they chose not to seek care at hospital or other intervention. - See prior note for background. He has been receiving Palliatiave Care, and has declined returning to Neurology. - Prior CVAS in past they were concerned about new TIA vs CVA recently - His wife is his primary caregiver - For Dementia, he requires assistance with most ADLs, limited activity, Mostly sedentary at home, watches TV, does not do many activities or walking indoor or outdoor, has some good days and some bad. He is very forgetful. Has some wandering behaviors around house but nothing that has caused him potential harm - Appetite is fairly good still without significant problem, sometimes intermittent than others, takes some Ensure at times - Taking Mirtazapine still with improved appetite and weight gain and help sleep - Regarding history of stroke, and also history of blood clots with DVT and PE, he has been on anticoagulation,  currently Eliquis, tolerating well, however he does have history of recurrent falls - asking about price of medication and if still needed it  CHRONIC HTN / Atrial Fibrillation (persistent) / CKD-III Reportshistory of elevated BP. Not always checking BP outside office. Elevated today. Current Meds -Lisinopril 30mg  daily, Metoprolol XL 100mg  daily Reports good compliance, took meds today. Tolerating well, w/o complaints. - He is on rate control and anticoagulation Eliquis - He admits poor diet, mostly sodas, still not drinking as much water  Health Maintenance: Declined Flu and Pneumonia vaccines.  Depression screen [facility] 2/9 05/18/2018 04/08/2018 03/11/2018  Decreased Interest 0 0 0  Down, Depressed, Hopeless 0 0 0  PHQ - 2 Score 0 0 0    Social History   Tobacco Use  . Smoking status: Never Smoker  . Smokeless tobacco: Current User    Types: Snuff, Chew  Substance Use Topics  . Alcohol use: No    Alcohol/week: 0.0 standard drinks  . Drug use: No    Review of Systems Per HPI unless specifically indicated above     Objective:    BP (!) 152/84 (BP Location: Left Arm, Cuff Size: Normal)   Pulse (!) 51   Temp 97.9 F (36.6 C) (Oral)   Resp 16   Ht 6\' 3"  (1.905 m)   Wt 193 lb (87.5 kg)   BMI 24.12 kg/m   Wt Readings from Last 3 Encounters:  05/18/18 193 lb (87.5 kg)  04/08/18 194 lb (88 kg)  03/11/18 194 lb (88 kg)    Physical Exam  Constitutional: He appears well-developed  and well-nourished. No distress.  Chronically ill 77 year old male but currently well and tired appearing, comfortable, cooperative  HENT:  Head: Normocephalic and atraumatic.  Eyes: Conjunctivae are normal. Right eye exhibits no discharge. Left eye exhibits no discharge.  Neck: Normal range of motion. Neck supple. No thyromegaly present.  Cardiovascular: Normal rate, normal heart sounds and intact distal pulses.  No murmur heard. Irregularly irregular  Pulmonary/Chest: Effort normal and  breath sounds normal. No respiratory distress. He has no wheezes. He has no rales.  Musculoskeletal: Normal range of motion. He exhibits no edema.  Lymphadenopathy:    He has no cervical adenopathy.  Neurological: He is alert. No cranial nerve deficit or sensory deficit. He exhibits normal muscle tone. Coordination normal.  Skin: Skin is warm and dry. No rash noted. He is not diaphoretic. No erythema.  Psychiatric: His behavior is normal.  Well groomed, eye contact is somewhat poor and intermittent, has significantly reduced amount of speech, will only respond with brief answers if asked direct question yes-no - otherwise does not speak, appears to be passive and has somewhat flat affect  Nursing note and vitals reviewed.  Results for orders placed or performed in visit on 02/11/18  Hemoglobin A1c  Result Value Ref Range   Hgb A1c MFr Bld 5.9 (H) <5.7 % of total Hgb   Mean Plasma Glucose 123 (calc)   eAG (mmol/L) 6.8 (calc)  CBC with Differential/Platelet  Result Value Ref Range   WBC 6.6 3.8 - 10.8 Thousand/uL   RBC 6.03 (H) 4.20 - 5.80 Million/uL   Hemoglobin 16.2 13.2 - 17.1 g/dL   HCT 49.3 38.5 - 50.0 %   MCV 81.8 80.0 - 100.0 fL   MCH 26.9 (L) 27.0 - 33.0 pg   MCHC 32.9 32.0 - 36.0 g/dL   RDW 14.2 11.0 - 15.0 %   Platelets 182 140 - 400 Thousand/uL   MPV 12.0 7.5 - 12.5 fL   Neutro Abs 3,406 1,500 - 7,800 cells/uL   Lymphs Abs 2,244 850 - 3,900 cells/uL   WBC mixed population 706 200 - 950 cells/uL   Eosinophils Absolute 205 15 - 500 cells/uL   Basophils Absolute 40 0 - 200 cells/uL   Neutrophils Relative % 51.6 %   Total Lymphocyte 34.0 %   Monocytes Relative 10.7 %   Eosinophils Relative 3.1 %   Basophils Relative 0.6 %  COMPLETE METABOLIC PANEL WITH GFR  Result Value Ref Range   Glucose, Bld 100 (H) 65 - 99 mg/dL   BUN 13 7 - 25 mg/dL   Creat 1.47 (H) 0.70 - 1.18 mg/dL   GFR, Est Non African American 46 (L) > OR = 60 mL/min/1.98m2   GFR, Est African American 53 (L)  > OR = 60 mL/min/1.43m2   BUN/Creatinine Ratio 9 6 - 22 (calc)   Sodium 142 135 - 146 mmol/L   Potassium 4.8 3.5 - 5.3 mmol/L   Chloride 107 98 - 110 mmol/L   CO2 23 20 - 32 mmol/L   Calcium 9.7 8.6 - 10.3 mg/dL   Total Protein 7.9 6.1 - 8.1 g/dL   Albumin 4.3 3.6 - 5.1 g/dL   Globulin 3.6 1.9 - 3.7 g/dL (calc)   AG Ratio 1.2 1.0 - 2.5 (calc)   Total Bilirubin 0.6 0.2 - 1.2 mg/dL   Alkaline phosphatase (APISO) 49 40 - 115 U/L   AST 24 10 - 35 U/L   ALT 32 9 - 46 U/L  TSH  Result Value  Ref Range   TSH 8.95 (H) 0.40 - 4.50 mIU/L  T4, free  Result Value Ref Range   Free T4 1.0 0.8 - 1.8 ng/dL      Assessment & Plan:   Problem List Items Addressed This Visit    Atrial fibrillation, persistent    Stable chronic AFib On rate control Metoprolol and anticoagulation Eliquis Future may consider 2nd opinion from Cardiology for Eliquis recs - determine duration of use, in future ultimately if decide to pursue more palliative treatment may not need Eliquis, high risk of fall as well, caregiver understands risk/benefit of this med, in future may need financial assistance as well      Essential hypertension - Primary    Significantly elevated initial BP, repeat manual check improved but still high SBP >150 - Home BP readings none available - can start checking more regular  Complication with CKD-III, Vascular Dementia    Plan:  1. Continue current BP regimen - Lisinopril 30mg  daily, Metoprolol XL 100mg  daily 2. Encourage improved lifestyle - emphasis on low sodium diet, regular exercise 3. Again try to Start monitor BP outside office, bring readings to next visit, if persistently >150/90 or new symptoms notify office sooner 4. Follow-up 3 months for yearly visit labs - if checking BP then - may consider add Amlodipine at future visit if still elevated BP - risk of hypotension precaution      Hypothyroidism (acquired)    Clinically still tired/fatigued, likely more multifactorial, less  likely hypothyroidism Prior TSH >8 in 01/2018, declined repeat lab Never dx hypothyroidism, not on levothyroxine in past  Plan Continue Levothyroxine 58mcg daily, offered to empirically increase to 43mcg without checking TSH today but will defer for now. - Check TSH Free T4 with other labs as scheduled in 3 months      Palliative care patient    Followed by [facility] for symptom management among other concerns. - Did not meet hospice criteria , his dementia seems to be FAST Stage 6, not quite stage 7  Recommend to continue f/u with Palliative monthly for symptom management and resources      Vascular dementia with behavioral disturbance (HCC)    Currently stable, with history of progressive dementia - suspected vascular w/ CVAs but possible alzheimer's, clinically FAST Stage 6 (needs help with ADLs) he is very limited verbal but seems to have some good days with more verbal - One episode setback recently, did not seek care, question TIA vs CVA but now has no new residual deficit - Some history of behavioral disturbance without evidence of harm or danger to self/others.  Failed Aricept, Namenda Prior followed by [facility] [name]. Now followed by [facility]  Plan: 1. Continue with Palliative Care until meet criteria for hospice - encouraged them to check in with palliative and call for updates, especially if worsening - Encourage improve mental stimulation, activities, avoid persistent sedentary lifestyle - Continue Eliquis for AFib, discussed may benefit in future from scaling back on anticoagulation and try antiplatelet only for CVA history if high risk of fall/bleed - would follow recs per Palliatiave for future goals of care         No orders of the defined types were placed in this encounter.   Follow up plan: Return in about 3 months (around 08/18/2018) for Annual Physical.  Future labs ordered for  08/2018  Nobie Putnam,  [facility] 05/18/2018, 10:40 AM

## 2018-05-18 NOTE — Assessment & Plan Note (Signed)
Followed by [facility] Care for symptom management among other concerns. - Did not meet hospice criteria , his dementia seems to be FAST Stage 6, not quite stage 7  Recommend to continue f/u with Palliative monthly for symptom management and resources 

## 2018-05-18 NOTE — Assessment & Plan Note (Addendum)
Significantly elevated initial BP, repeat manual check improved but still high SBP >150 - Home BP readings none available - can start checking more regular  Complication with CKD-III, Vascular Dementia    Plan:  1. Continue current BP regimen - Lisinopril 30mg  daily, Metoprolol XL 100mg  daily 2. Encourage improved lifestyle - emphasis on low sodium diet, regular exercise 3. Again try to Start monitor BP outside office, bring readings to next visit, if persistently >150/90 or new symptoms notify office sooner 4. Follow-up 3 months for yearly visit labs - if checking BP then - may consider add Amlodipine at future visit if still elevated BP - risk of hypotension precaution

## 2018-05-18 NOTE — Patient Instructions (Addendum)
Thank you for coming to the office today.  Keep track of BP, goal is < 150/90, if too elevated then notify office may need to return and adjust BP meds  For future can consider follow-up with Cardiologist to discuss Eliquis further if need or can get cost savings.  DUE for FASTING BLOOD WORK (no food or drink after midnight before the lab appointment, only water or coffee without cream/sugar on the morning of)  SCHEDULE "Lab Only" visit in the morning at the clinic for lab draw in 3 MONTHS   - Make sure Lab Only appointment is at about 1 week before your next appointment, so that results will be available  For Lab Results, once available within 2-3 days of blood draw, you can can log in to MyChart online to view your results and a brief explanation. Also, we can discuss results at next follow-up visit.   Please schedule a Follow-up Appointment to: Return in about 3 months (around 08/18/2018) for Annual Physical.  If you have any other questions or concerns, please feel free to call the office or send a message through [place]. You may also schedule an earlier appointment if necessary.  Additionally, you may be receiving a survey about your experience at our office within a few days to 1 week by e-mail or mail. We value your feedback.  Nobie Putnam, DO [place]

## 2018-05-18 NOTE — Assessment & Plan Note (Signed)
Currently stable, with history of progressive dementia - suspected vascular w/ CVAs but possible alzheimer's, clinically FAST Stage 6 (needs help with ADLs) he is very limited verbal but seems to have some good days with more verbal - One episode setback recently, did not seek care, question TIA vs CVA but now has no new residual deficit - Some history of behavioral disturbance without evidence of harm or danger to self/others.  Failed Aricept, Namenda Prior followed by [facility] [name]. Now followed by [facility]  Plan: 1. Continue with Palliative Care until meet criteria for hospice - encouraged them to check in with palliative and call for updates, especially if worsening - Encourage improve mental stimulation, activities, avoid persistent sedentary lifestyle - Continue Eliquis for AFib, discussed may benefit in future from scaling back on anticoagulation and try antiplatelet only for CVA history if high risk of fall/bleed - would follow recs per Palliatiave for future goals of care

## 2018-05-18 NOTE — Assessment & Plan Note (Signed)
Stable chronic AFib On rate control Metoprolol and anticoagulation Eliquis Future may consider 2nd opinion from Cardiology for Eliquis recs - determine duration of use, in future ultimately if decide to pursue more palliative treatment may not need Eliquis, high risk of fall as well, caregiver understands risk/benefit of this med, in future may need financial assistance as well

## 2018-05-18 NOTE — Assessment & Plan Note (Signed)
Clinically still tired/fatigued, likely more multifactorial, less likely hypothyroidism Prior TSH >8 in 01/2018, declined repeat lab Never dx hypothyroidism, not on levothyroxine in past  Plan Continue Levothyroxine 18mcg daily, offered to empirically increase to 86mcg without checking TSH today but will defer for now. - Check TSH Free T4 with other labs as scheduled in 3 months

## 2018-06-14 ENCOUNTER — Other Ambulatory Visit: Payer: Self-pay | Admitting: Family Medicine

## 2018-06-14 DIAGNOSIS — R569 Unspecified convulsions: Secondary | ICD-10-CM

## 2018-06-15 ENCOUNTER — Telehealth: Payer: Self-pay | Admitting: Family Medicine

## 2018-06-15 NOTE — Telephone Encounter (Signed)
Spoke with patient's wife who called today requesting help at home. Discussed difference between Home health services and Personal care services w/ home aide. She is interested in both services. Philip Moreno has had some falls at home, somewhat unsteady.  I gave her contact info for local [facility] to discuss PCS options with them next. They may send me orders/info if locate company.  Next if she is interested, may schedule office visit to come in and discuss [place] orders and eval when ready.  Philip Moreno, [facility] 06/15/2018, 8:22 AM

## 2018-06-22 DIAGNOSIS — J40 Bronchitis, not specified as acute or chronic: Secondary | ICD-10-CM | POA: Diagnosis not present

## 2018-06-22 DIAGNOSIS — B9689 Other specified bacterial agents as the cause of diseases classified elsewhere: Secondary | ICD-10-CM | POA: Diagnosis not present

## 2018-06-22 DIAGNOSIS — J019 Acute sinusitis, unspecified: Secondary | ICD-10-CM | POA: Diagnosis not present

## 2018-08-01 ENCOUNTER — Other Ambulatory Visit: Payer: Self-pay | Admitting: Family Medicine

## 2018-08-01 DIAGNOSIS — M1A0710 Idiopathic chronic gout, right ankle and foot, without tophus (tophi): Secondary | ICD-10-CM

## 2018-08-09 ENCOUNTER — Other Ambulatory Visit: Payer: Self-pay | Admitting: Family Medicine

## 2018-08-09 MED ORDER — LISINOPRIL 30 MG PO TABS: 30.0000 mg | ORAL_TABLET | Freq: Every day | ORAL | 1 refills | Status: DC

## 2018-08-09 NOTE — Telephone Encounter (Signed)
Pt needs a prescription for lisinopril sent to [facility].

## 2018-08-11 ENCOUNTER — Other Ambulatory Visit: Payer: Medicare Other

## 2018-08-11 DIAGNOSIS — E039 Hypothyroidism, unspecified: Secondary | ICD-10-CM

## 2018-08-11 DIAGNOSIS — F01518 Vascular dementia, unspecified severity, with other behavioral disturbance: Secondary | ICD-10-CM

## 2018-08-11 DIAGNOSIS — M1A0710 Idiopathic chronic gout, right ankle and foot, without tophus (tophi): Secondary | ICD-10-CM | POA: Diagnosis not present

## 2018-08-11 DIAGNOSIS — I10 Essential (primary) hypertension: Secondary | ICD-10-CM | POA: Diagnosis not present

## 2018-08-11 DIAGNOSIS — R7303 Prediabetes: Secondary | ICD-10-CM | POA: Diagnosis not present

## 2018-08-11 DIAGNOSIS — Z0000 Encounter for general adult medical examination without abnormal findings: Secondary | ICD-10-CM

## 2018-08-11 DIAGNOSIS — F0151 Vascular dementia with behavioral disturbance: Secondary | ICD-10-CM

## 2018-08-11 DIAGNOSIS — I4819 Other persistent atrial fibrillation: Secondary | ICD-10-CM

## 2018-08-11 DIAGNOSIS — N183 Chronic kidney disease, stage 3 unspecified: Secondary | ICD-10-CM

## 2018-08-12 ENCOUNTER — Ambulatory Visit (INDEPENDENT_AMBULATORY_CARE_PROVIDER_SITE_OTHER): Payer: Medicare Other | Admitting: Family Medicine

## 2018-08-12 ENCOUNTER — Other Ambulatory Visit: Payer: Self-pay | Admitting: Family Medicine

## 2018-08-12 ENCOUNTER — Encounter: Payer: Self-pay | Admitting: Family Medicine

## 2018-08-12 VITALS — BP 147/84 | HR 52 | Resp 16 | Ht 75.0 in | Wt 187.0 lb

## 2018-08-12 DIAGNOSIS — N183 Chronic kidney disease, stage 3 unspecified: Secondary | ICD-10-CM

## 2018-08-12 DIAGNOSIS — R569 Unspecified convulsions: Secondary | ICD-10-CM | POA: Diagnosis not present

## 2018-08-12 DIAGNOSIS — R7303 Prediabetes: Secondary | ICD-10-CM

## 2018-08-12 DIAGNOSIS — E039 Hypothyroidism, unspecified: Secondary | ICD-10-CM | POA: Diagnosis not present

## 2018-08-12 DIAGNOSIS — Z0000 Encounter for general adult medical examination without abnormal findings: Secondary | ICD-10-CM | POA: Diagnosis not present

## 2018-08-12 DIAGNOSIS — F01518 Vascular dementia, unspecified severity, with other behavioral disturbance: Secondary | ICD-10-CM

## 2018-08-12 DIAGNOSIS — I129 Hypertensive chronic kidney disease with stage 1 through stage 4 chronic kidney disease, or unspecified chronic kidney disease: Secondary | ICD-10-CM

## 2018-08-12 DIAGNOSIS — I4819 Other persistent atrial fibrillation: Secondary | ICD-10-CM

## 2018-08-12 DIAGNOSIS — F0151 Vascular dementia with behavioral disturbance: Secondary | ICD-10-CM

## 2018-08-12 DIAGNOSIS — M1A0710 Idiopathic chronic gout, right ankle and foot, without tophus (tophi): Secondary | ICD-10-CM

## 2018-08-12 DIAGNOSIS — I10 Essential (primary) hypertension: Secondary | ICD-10-CM

## 2018-08-12 DIAGNOSIS — Z515 Encounter for palliative care: Secondary | ICD-10-CM

## 2018-08-12 LAB — LIPID PANEL
Cholesterol: 164 mg/dL (ref <200)
HDL: 46 mg/dL (ref >40)
LDL Cholesterol (Calc): 96 mg/dL (calc)
Non-HDL Cholesterol (Calc): 118 mg/dL (calc) (ref <130)
Total CHOL/HDL Ratio: 3.6 (calc) (ref <5.0)
Triglycerides: 123 mg/dL (ref <150)

## 2018-08-12 LAB — CBC WITH DIFFERENTIAL/PLATELET
Absolute Monocytes: 815 cells/uL (ref 200–950)
Basophils Absolute: 50 cells/uL (ref 0–200)
Basophils Relative: 0.6 %
Eosinophils Absolute: 176 cells/uL (ref 15–500)
Eosinophils Relative: 2.1 %
HCT: 53.8 % — ABNORMAL HIGH (ref 38.5–50.0)
Hemoglobin: 17.3 g/dL — ABNORMAL HIGH (ref 13.2–17.1)
Lymphs Abs: 2705 cells/uL (ref 850–3900)
MCH: 26.7 pg — ABNORMAL LOW (ref 27.0–33.0)
MCHC: 32.2 g/dL (ref 32.0–36.0)
MCV: 82.9 fL (ref 80.0–100.0)
MONOS PCT: 9.7 %
MPV: 11.8 fL (ref 7.5–12.5)
Neutro Abs: 4654 cells/uL (ref 1500–7800)
Neutrophils Relative %: 55.4 %
Platelets: 196 10*3/uL (ref 140–400)
RBC: 6.49 10*6/uL — ABNORMAL HIGH (ref 4.20–5.80)
RDW: 15.9 % — AB (ref 11.0–15.0)
Total Lymphocyte: 32.2 %
WBC: 8.4 10*3/uL (ref 3.8–10.8)

## 2018-08-12 LAB — COMPLETE METABOLIC PANEL WITH GFR
AG Ratio: 1.2 (calc) (ref 1.0–2.5)
ALT: 93 U/L — ABNORMAL HIGH (ref 9–46)
AST: 59 U/L — ABNORMAL HIGH (ref 10–35)
Albumin: 4.3 g/dL (ref 3.6–5.1)
Alkaline phosphatase (APISO): 50 U/L (ref 40–115)
BUN/Creatinine Ratio: 9 (calc) (ref 6–22)
BUN: 15 mg/dL (ref 7–25)
CO2: 26 mmol/L (ref 20–32)
Calcium: 9.9 mg/dL (ref 8.6–10.3)
Chloride: 106 mmol/L (ref 98–110)
Creat: 1.62 mg/dL — ABNORMAL HIGH (ref 0.70–1.18)
GFR, Est African American: 47 mL/min/{1.73_m2} — ABNORMAL LOW (ref >=60)
GFR, Est Non African American: 41 mL/min/{1.73_m2} — ABNORMAL LOW (ref >=60)
Globulin: 3.5 g/dL (calc) (ref 1.9–3.7)
Glucose, Bld: 98 mg/dL (ref 65–99)
Potassium: 4.1 mmol/L (ref 3.5–5.3)
Sodium: 144 mmol/L (ref 135–146)
Total Bilirubin: 0.8 mg/dL (ref 0.2–1.2)
Total Protein: 7.8 g/dL (ref 6.1–8.1)

## 2018-08-12 LAB — HEMOGLOBIN A1C
EAG (MMOL/L): 6.8 (calc)
Hgb A1c MFr Bld: 5.9 % of total Hgb — ABNORMAL HIGH (ref <5.7)
Mean Plasma Glucose: 123 (calc)

## 2018-08-12 LAB — URIC ACID: URIC ACID, SERUM: 8.7 mg/dL — AB (ref 4.0–8.0)

## 2018-08-12 LAB — TSH: TSH: 9.7 mIU/L — ABNORMAL HIGH (ref 0.40–4.50)

## 2018-08-12 LAB — T4, FREE: Free T4: 1.3 ng/dL (ref 0.8–1.8)

## 2018-08-12 NOTE — Patient Instructions (Addendum)
Thank you for coming to the office today.  Elevated Creatinine, - kidney function slightly reduced.  For Gout - Uric acid level is still slightly elevated - good news is that yo [place] not had any flare - STOP Colchicine - CONTINUE Allopurinol 100mg  daily to prevent future flare  If you get a flare then RESTART Colchicine - First dose take 2 tabs (1.2mg  total), wait 1 hour then take 1 tab (0.6mg ). Then next day start with 1 tab daily until pain resolves, if after 3 days pain is not resolving then you can increase to 1 tab twice a day until resolved.   TSH is elevated, means thyroid hormone is low - INCREASE Levothyroxine from 26mcg daily before breakfast - now take TWO pills at once, for 33mcg - we can send new pill for 17mcg in one pill when ready - call us  DUE for FASTING BLOOD WORK (no food or drink after midnight before the lab appointment, only water or coffee without cream/sugar on the morning of)  SCHEDULE "Lab Only" visit in the morning at the clinic for lab draw in 3 MONTHS   - Make sure Lab Only appointment is at about 1 week before your next appointment, so that results will be available  For Lab Results, once available within 2-3 days of blood draw, you can can log in to MyChart online to view your results and a brief explanation. Also, we can discuss results at next follow-up visit.    Please schedule a Follow-up Appointment to: Return in about 3 months (around 11/11/2018) for 3 month follow-up lab results - Thyroid, HTN, CKD.  If you have any other questions or concerns, please feel free to call the office or send a message through [place]. You may also schedule an earlier appointment if necessary.  Additionally, you may be receiving a survey about your experience at our office within a few days to 1 week by e-mail or mail. We value your feedback.  Nobie Putnam, DO [place]

## 2018-08-12 NOTE — Assessment & Plan Note (Addendum)
Stable chronic AFib On rate control Metoprolol and anticoagulation Eliquis  Similar to discussion in past - agree to continue Eliquis, concern w/ fall risk however - have reviewed risk/reward of anticoagulation, future may hold or DC med if indicated

## 2018-08-12 NOTE — Progress Notes (Signed)
Subjective:    Patient ID: Philip Moreno, male    DOB: 28-Jul-1941, 78 y.o.   MRN: [id number]  Philip Moreno is a 78 y.o. male presenting on 08/12/2018 for Annual Exam  History provided by wife, [name]. Patient has Dementia and unable to provide significant history.  HPI   Here for Annual Physical and Lab Review.  Gout No recent flares now >3+ months since started Allopurinol 100mg  daily for prophylaxis, also with colchicine. Last lab shows Uric Acid 8.7  Hypothyroidism Elevated TSH trend still from 8.9 up to 9.7, despite low dose levothyroxine 25, reportedly taking daily on empty stomach in AM. Not missing.  Seizures See prior note for background w/ neurology. He has done well on current dose Keppra 1000mg  BID, has not had breakthrough seizures, no new concerns. No longer wishes to f/u with Neurology [name] unless needed, they have agreed for PCP to provide seizure med.  VascularDementia with behavior disturbance / History of CVA with residual deficits - See prior note for background. He has been receiving [facility] from [facility], and has declined returning to Neurology. Palliatiave has contacted intermittently. - His wife is his primary caregiver - For Dementia, he requires assistance with most ADLs, limited activity, Mostly sedentary at home, watches TV, does not do many activities or walking indoor or outdoor, has some good days and some bad. He is very forgetful. Has some wandering behaviors around house but nothing that has caused him potential harm - Appetite is improved. Not drinking enough water - Taking Mirtazapine still with improved appetite and weight gain and help sleep - Regarding history of stroke, and also history of blood clots with DVT and PE, he has been on anticoagulation, currently Eliquis, tolerating well without bleeding  CHRONIC HTN/ Atrial Fibrillation (persistent) / CKD-III Reportshistory of elevated BP. Not always checking BP  outside office. Still elevated today. Current Meds -Lisinopril 30mg  daily, Metoprolol XL 100mg  daily Reports good compliance, took meds today. Tolerating well, w/o complaints. - He is on rate control and anticoagulation Eliquis - He admits poor diet, mostly sodas, still not drinking as much water  Additional complaint   Fall, with R rib injury Recent fall accidental episode, while trying to step out of the house yesterday, fumbled and fell from standing off porch and landed on grass. Initially reported pain last night, and some soreness today, seems improved now. Using heating pad.  Allopurinol  Health Maintenance: Declined Flu and Pneumonia vaccines. UTD on Colonoscopy 2016   Depression screen [facility] 2/9 08/12/2018 05/18/2018 04/08/2018  Decreased Interest 0 0 0  Down, Depressed, Hopeless 0 0 0  PHQ - 2 Score 0 0 0    Past Medical History:  Diagnosis Date  . Arthritis   . Cancer [facility]) 05/2011   prostate cancer, [facility]  . DVT (deep venous thrombosis) ([place])   . GERD (gastroesophageal reflux disease)   . Memory loss   . Pulmonary embolism ([place])   . TIA (transient ischemic attack)    Past Surgical History:  Procedure Laterality Date  . COLONOSCOPY WITH PROPOFOL N/A 02/27/2015   Procedure: COLONOSCOPY WITH PROPOFOL;  Surgeon: Christene Lye, MD;  Location: [facility] ENDOSCOPY;  Service: Endoscopy;  Laterality: N/A;  . LYMPHADENECTOMY  08/25/2012   Procedure: LYMPHADENECTOMY;  Surgeon: Dutch Gray, MD;  Location: [facility] ORS;  Service: Urology;  Laterality: Bilateral;  . PROSTATE SURGERY  2012  . ROBOT ASSISTED LAPAROSCOPIC RADICAL PROSTATECTOMY  08/25/2012   Procedure: ROBOTIC ASSISTED LAPAROSCOPIC  RADICAL PROSTATECTOMY LEVEL 2;  Surgeon: Dutch Gray, MD;  Location: [facility] ORS;  Service: Urology;  Laterality: N/A;   Social History   Socioeconomic History  . Marital status: Married    Spouse name: Jamelle Haring   . Number of children: 0  . Years of education: 7  . Highest education  level: Not on file  Occupational History  . Occupation: farmer   Social Needs  . Financial resource strain: Not on file  . Food insecurity:    Worry: Not on file    Inability: Not on file  . Transportation needs:    Medical: Not on file    Non-medical: Not on file  Tobacco Use  . Smoking status: Never Smoker  . Smokeless tobacco: Current User    Types: Snuff, Chew  Substance and Sexual Activity  . Alcohol use: No    Alcohol/week: 0.0 standard drinks  . Drug use: No  . Sexual activity: Not on file  Lifestyle  . Physical activity:    Days per week: Not on file    Minutes per session: Not on file  . Stress: Not on file  Relationships  . Social connections:    Talks on phone: Not on file    Gets together: Not on file    Attends religious service: Not on file    Active member of club or organization: Not on file    Attends meetings of clubs or organizations: Not on file    Relationship status: Not on file  . Intimate partner violence:    Fear of current or ex partner: Not on file    Emotionally abused: Not on file    Physically abused: Not on file    Forced sexual activity: Not on file  Other Topics Concern  . Not on file  Social History Narrative   Patient lives at home with his wife    Patient is a part time farmer    Patient has a 7th grade education.    Patient has no children.    Caffeine use: daily   Right handed   Family History  Problem Relation Age of Onset  . Hypertension Mother   . Heart attack Father   . Cancer Brother   . Heart failure Brother   . Diabetes Brother    Current Outpatient Medications on File Prior to Visit  Medication Sig  . allopurinol (ZYLOPRIM) 100 MG tablet TAKE 1 TABLET BY MOUTH ONCE DAILY  . AMITIZA 24 MCG capsule TAKE 1 CAPSULE BY MOUTH ONCE DAILY AS NEEDED FOR CONSTIPATION  . apixaban (ELIQUIS) 5 MG TABS tablet Take 1 tablet (5 mg total) by mouth 2 (two) times daily.  . clotrimazole-betamethasone (LOTRISONE) cream Apply  topically 2 (two) times daily. For up to 1-2 weeks, may re-use daily up to 1 week as needed.  . colchicine 0.6 MG tablet Take only as needed for acute gout flare within 24 hours - take 2 tablets, then 1 hour later take 1 more tablet, then switch to 1 tab daily until resolve  . levETIRAcetam (KEPPRA) 1000 MG tablet TAKE 1 TABLET BY MOUTH TWICE DAILY  . levothyroxine (SYNTHROID, LEVOTHROID) 25 MCG tablet Take 2 tablets (50 mcg total) by mouth daily before breakfast.  . lisinopril (PRINIVIL,ZESTRIL) 30 MG tablet Take 1 tablet (30 mg total) by mouth daily.  . metoprolol succinate (TOPROL-XL) 100 MG 24 hr tablet Take 1 tablet (100 mg total) by mouth daily. Take with or immediately following a meal.  . mirtazapine (  REMERON SOL-TAB) 15 MG disintegrating tablet Take 15 mg by mouth at bedtime.   No current facility-administered medications on file prior to visit.     Review of Systems Per HPI unless specifically indicated above      Objective:    BP (!) 147/84 (BP Location: Left Arm, Cuff Size: Normal)   Pulse (!) 52   Resp 16   Ht 6\' 3"  (1.905 m)   Wt 187 lb (84.8 kg)   SpO2 100%   BMI 23.37 kg/m   Wt Readings from Last 3 Encounters:  08/12/18 187 lb (84.8 kg)  05/18/18 193 lb (87.5 kg)  04/08/18 194 lb (88 kg)    Physical Exam Results for orders placed or performed in visit on 08/11/18  Uric acid  Result Value Ref Range   Uric Acid, Serum 8.7 (H) 4.0 - 8.0 mg/dL  T4, free  Result Value Ref Range   Free T4 1.3 0.8 - 1.8 ng/dL  Lipid panel  Result Value Ref Range   Cholesterol 164 <200 mg/dL   HDL 46 >40 mg/dL   Triglycerides 123 <150 mg/dL   LDL Cholesterol (Calc) 96 mg/dL (calc)   Total CHOL/HDL Ratio 3.6 <5.0 (calc)   Non-HDL Cholesterol (Calc) 118 <130 mg/dL (calc)  TSH  Result Value Ref Range   TSH 9.70 (H) 0.40 - 4.50 mIU/L  COMPLETE METABOLIC PANEL WITH GFR  Result Value Ref Range   Glucose, Bld 98 65 - 99 mg/dL   BUN 15 7 - 25 mg/dL   Creat 1.62 (H) 0.70 - 1.18  mg/dL   GFR, Est Non African American 41 (L) > OR = 60 mL/min/1.47m2   GFR, Est African American 47 (L) > OR = 60 mL/min/1.10m2   BUN/Creatinine Ratio 9 6 - 22 (calc)   Sodium 144 135 - 146 mmol/L   Potassium 4.1 3.5 - 5.3 mmol/L   Chloride 106 98 - 110 mmol/L   CO2 26 20 - 32 mmol/L   Calcium 9.9 8.6 - 10.3 mg/dL   Total Protein 7.8 6.1 - 8.1 g/dL   Albumin 4.3 3.6 - 5.1 g/dL   Globulin 3.5 1.9 - 3.7 g/dL (calc)   AG Ratio 1.2 1.0 - 2.5 (calc)   Total Bilirubin 0.8 0.2 - 1.2 mg/dL   Alkaline phosphatase (APISO) 50 40 - 115 U/L   AST 59 (H) 10 - 35 U/L   ALT 93 (H) 9 - 46 U/L  CBC with Differential/Platelet  Result Value Ref Range   WBC 8.4 3.8 - 10.8 Thousand/uL   RBC 6.49 (H) 4.20 - 5.80 Million/uL   Hemoglobin 17.3 (H) 13.2 - 17.1 g/dL   HCT 53.8 (H) 38.5 - 50.0 %   MCV 82.9 80.0 - 100.0 fL   MCH 26.7 (L) 27.0 - 33.0 pg   MCHC 32.2 32.0 - 36.0 g/dL   RDW 15.9 (H) 11.0 - 15.0 %   Platelets 196 140 - 400 Thousand/uL   MPV 11.8 7.5 - 12.5 fL   Neutro Abs 4,654 1,500 - 7,800 cells/uL   Lymphs Abs 2,705 850 - 3,900 cells/uL   Absolute Monocytes 815 200 - 950 cells/uL   Eosinophils Absolute 176 15 - 500 cells/uL   Basophils Absolute 50 0 - 200 cells/uL   Neutrophils Relative % 55.4 %   Total Lymphocyte 32.2 %   Monocytes Relative 9.7 %   Eosinophils Relative 2.1 %   Basophils Relative 0.6 %  Hemoglobin A1c  Result Value Ref Range   Hgb A1c  MFr Bld 5.9 (H) <5.7 % of total Hgb   Mean Plasma Glucose 123 (calc)   eAG (mmol/L) 6.8 (calc)      Assessment & Plan:   Problem List Items Addressed This Visit    Atrial fibrillation, persistent    Stable chronic AFib On rate control Metoprolol and anticoagulation Eliquis  Similar to discussion in past - agree to continue Eliquis, concern w/ fall risk however - have reviewed risk/reward of anticoagulation, future may hold or DC med if indicated      Benign hypertension with CKD (chronic kidney disease) stage III (HCC)     Significantly elevated initial BP, repeat manual check improved but still above goal - Home BP readings none available - can start checking more regular  Complication with CKD-III, Vascular Dementia    Plan:  1. Continue current BP regimen - Lisinopril 30mg  daily, Metoprolol XL 100mg  daily 2. Encourage improved lifestyle - emphasis on low sodium diet, regular exercise 3. monitor BP outside office, bring readings to next visit, if persistently >150/90 or new symptoms notify office sooner 4. Follow-up 3 months - reconsider additional antiHTN agent however concern dizzy hypotension fall risk      Chronic gout    Improved on prophylaxis now without gout flare in >3 months Uric acid 8.7  Continue allopurinol 100mg  daily STOP Colchicine 0.6mg  daily - use as PRN only now given new instructions if acute flare Future consider dose adjust allopurinol, hesitant to increase further with CKD      Relevant Medications   colchicine 0.6 MG tablet   CKD (chronic kidney disease) stage 3, GFR 30-59 ml/min (HCC)    Elevated creatinine up to 1.6, likely some mild dehydration w/ poor water intake, possible AKI  Will adjust Lisinopril in future if continued worse or remains elevated - emphasis now to improve hydration Re-check chemistry Cr trend in 3 months      Hypothyroidism (acquired)    Elevated TSH further from 8 to 9.7 on levo 25 Clinically still tired/fatigued, likely more multifactorial however  Plan INCREASE Levothyroxine from 25 to 57mcg daily, - Check TSH Free T4 with other labs as scheduled in 3 months      Relevant Medications   levothyroxine (SYNTHROID, LEVOTHROID) 25 MCG tablet   Palliative care patient    Followed by [place] for symptom management among other concerns. - Did not meet hospice criteria , his dementia seems to be FAST Stage 6, not quite stage 7  Recommend to continue f/u with Palliative monthly for symptom management and resources       Pre-diabetes    Stable mild elevated A1c 5.9 Given age and comorbid conditions, advised goal to maintain diet and weight focus on nutrition, not following strict DM diet Follow-up      Seizure ([place])    Stable without recent seizure activity No longer followed by [facility] [name] - see note, agreed for [place] to manage seizures unless significant clinical change then he may return  Plan - Continue on Keppra 1000mg  BID      Vascular dementia with behavioral disturbance (HCC)    Currently stable, with history of progressive dementia - suspected vascular w/ CVAs but possible alzheimer's, clinically FAST Stage 6 (needs help with ADLs) he is very limited verbal but seems to have some good days with more verbal - Some history of behavioral disturbance without evidence of harm or danger to self/others.  Failed Aricept, Namenda Prior followed by [facility] Neuro [name].  Now followed by [place] [facility]  Plan: 1. Continue with Palliative Care until meet criteria for hospice - encouraged them to check in with palliative and call for updates, especially if worsening - Encourage improve mental stimulation, activities, avoid persistent sedentary lifestyle - Continue Eliquis for AFib, discussed may benefit in future from scaling back on anticoagulation and try antiplatelet only for CVA history if high risk of fall/bleed - would follow recs per Palliatiave for future goals of care       Other Visit Diagnoses    Annual physical exam    -  Primary      Updated Health Maintenance information Reviewed recent lab results with patient Encouraged improvement to lifestyle with diet and exercise  No orders of the defined types were placed in this encounter.   Follow up plan: Return in about 3 months (around 11/11/2018) for 3 month follow-up lab results - Thyroid, HTN, CKD.   Future labs ordered for 11/14/18  Nobie Putnam, [facility] 08/12/2018, 2:37  PM

## 2018-08-13 NOTE — Assessment & Plan Note (Signed)
Followed by [place] for symptom management among other concerns. - Did not meet hospice criteria , his dementia seems to be FAST Stage 6, not quite stage 7  Recommend to continue f/u with Palliative monthly for symptom management and resources

## 2018-08-13 NOTE — Assessment & Plan Note (Signed)
Currently stable, with history of progressive dementia - suspected vascular w/ CVAs but possible alzheimer's, clinically FAST Stage 6 (needs help with ADLs) he is very limited verbal but seems to have some good days with more verbal - Some history of behavioral disturbance without evidence of harm or danger to self/others.  Failed Aricept, Namenda Prior followed by [facility] Neuro [name]. Now followed by [place] [facility]  Plan: 1. Continue with Palliative Care until meet criteria for hospice - encouraged them to check in with palliative and call for updates, especially if worsening - Encourage improve mental stimulation, activities, avoid persistent sedentary lifestyle - Continue Eliquis for AFib, discussed may benefit in future from scaling back on anticoagulation and try antiplatelet only for CVA history if high risk of fall/bleed - would follow recs per Palliatiave for future goals of care

## 2018-08-13 NOTE — Assessment & Plan Note (Signed)
Improved on prophylaxis now without gout flare in >3 months Uric acid 8.7  Continue allopurinol 100mg  daily STOP Colchicine 0.6mg  daily - use as PRN only now given new instructions if acute flare Future consider dose adjust allopurinol, hesitant to increase further with CKD

## 2018-08-13 NOTE — Assessment & Plan Note (Signed)
Stable without recent seizure activity No longer followed by [facility] [name] - see note, agreed for [place] to manage seizures unless significant clinical change then he may return  Plan - Continue on Keppra 1000mg  BID

## 2018-08-13 NOTE — Assessment & Plan Note (Signed)
Stable mild elevated A1c 5.9 Given age and comorbid conditions, advised goal to maintain diet and weight focus on nutrition, not following strict DM diet Follow-up

## 2018-08-13 NOTE — Assessment & Plan Note (Signed)
Significantly elevated initial BP, repeat manual check improved but still above goal - Home BP readings none available - can start checking more regular  Complication with CKD-III, Vascular Dementia    Plan:  1. Continue current BP regimen - Lisinopril 30mg  daily, Metoprolol XL 100mg  daily 2. Encourage improved lifestyle - emphasis on low sodium diet, regular exercise 3. monitor BP outside office, bring readings to next visit, if persistently >150/90 or new symptoms notify office sooner 4. Follow-up 3 months - reconsider additional antiHTN agent however concern dizzy hypotension fall risk

## 2018-08-13 NOTE — Assessment & Plan Note (Signed)
Elevated creatinine up to 1.6, likely some mild dehydration w/ poor water intake, possible AKI  Will adjust Lisinopril in future if continued worse or remains elevated - emphasis now to improve hydration Re-check chemistry Cr trend in 3 months

## 2018-08-13 NOTE — Assessment & Plan Note (Signed)
Elevated TSH further from 8 to 9.7 on levo 25 Clinically still tired/fatigued, likely more multifactorial however  Plan INCREASE Levothyroxine from 25 to 38mcg daily, - Check TSH Free T4 with other labs as scheduled in 3 months

## 2018-08-18 ENCOUNTER — Encounter: Payer: Medicare Other | Admitting: Family Medicine

## 2018-08-21 ENCOUNTER — Other Ambulatory Visit: Payer: Self-pay | Admitting: Family Medicine

## 2018-08-21 DIAGNOSIS — E039 Hypothyroidism, unspecified: Secondary | ICD-10-CM

## 2018-08-22 ENCOUNTER — Encounter: Payer: Medicare Other | Admitting: Family Medicine

## 2018-09-06 ENCOUNTER — Telehealth: Payer: Self-pay | Admitting: Family Medicine

## 2018-09-06 DIAGNOSIS — F01518 Vascular dementia, unspecified severity, with other behavioral disturbance: Secondary | ICD-10-CM

## 2018-09-06 DIAGNOSIS — F0151 Vascular dementia with behavioral disturbance: Secondary | ICD-10-CM

## 2018-09-06 NOTE — Telephone Encounter (Signed)
Pt wife called requesting refill on mirtazapine called into  [facility] [address]

## 2018-09-07 MED ORDER — MIRTAZAPINE 15 MG PO TBDP: 15.0000 mg | ORAL_TABLET | Freq: Every day | ORAL | 11 refills | Status: AC

## 2018-09-07 NOTE — Telephone Encounter (Signed)
Refilled  Philip Putnam, DO [facility] 09/07/2018, 8:09 AM

## 2018-10-21 ENCOUNTER — Other Ambulatory Visit: Payer: Self-pay | Admitting: Family Medicine

## 2018-10-21 DIAGNOSIS — Z86711 Personal history of pulmonary embolism: Secondary | ICD-10-CM

## 2018-10-21 DIAGNOSIS — I4819 Other persistent atrial fibrillation: Secondary | ICD-10-CM

## 2018-11-11 ENCOUNTER — Other Ambulatory Visit: Payer: Self-pay | Admitting: Family Medicine

## 2018-11-11 DIAGNOSIS — M1A0710 Idiopathic chronic gout, right ankle and foot, without tophus (tophi): Secondary | ICD-10-CM

## 2018-11-14 ENCOUNTER — Other Ambulatory Visit: Payer: Medicare Other

## 2018-11-21 ENCOUNTER — Ambulatory Visit: Payer: Medicare Other | Admitting: Family Medicine

## 2018-12-17 ENCOUNTER — Other Ambulatory Visit: Payer: Self-pay | Admitting: Family Medicine

## 2018-12-17 DIAGNOSIS — R569 Unspecified convulsions: Secondary | ICD-10-CM

## 2019-01-06 ENCOUNTER — Telehealth: Payer: Self-pay | Admitting: Primary Care

## 2019-01-06 NOTE — Telephone Encounter (Signed)
T/c to mobile number, home is disconnected. Unable to leave message, no VM set up. Reached wife. Discussed services with wife. She states he is the same. She is primary caregiver. She states it will be ok for me to schedule a home visit once that opens back up, late summer. Will call for appt in July/Aug.

## 2019-01-19 ENCOUNTER — Other Ambulatory Visit: Payer: Medicare Other

## 2019-01-19 DIAGNOSIS — E039 Hypothyroidism, unspecified: Secondary | ICD-10-CM | POA: Diagnosis not present

## 2019-01-19 DIAGNOSIS — N183 Chronic kidney disease, stage 3 (moderate): Secondary | ICD-10-CM | POA: Diagnosis not present

## 2019-01-19 DIAGNOSIS — I10 Essential (primary) hypertension: Secondary | ICD-10-CM | POA: Diagnosis not present

## 2019-01-20 LAB — CBC WITH DIFFERENTIAL/PLATELET
Absolute Monocytes: 726 cells/uL (ref 200–950)
Basophils Absolute: 31 cells/uL (ref 0–200)
Basophils Relative: 0.5 %
Eosinophils Absolute: 104 cells/uL (ref 15–500)
Eosinophils Relative: 1.7 %
HCT: 48.2 % (ref 38.5–50.0)
Hemoglobin: 16.1 g/dL (ref 13.2–17.1)
Lymphs Abs: 1732 cells/uL (ref 850–3900)
MCH: 27.7 pg (ref 27.0–33.0)
MCHC: 33.4 g/dL (ref 32.0–36.0)
MCV: 83 fL (ref 80.0–100.0)
MPV: 12 fL (ref 7.5–12.5)
Monocytes Relative: 11.9 %
Neutro Abs: 3508 cells/uL (ref 1500–7800)
Neutrophils Relative %: 57.5 %
Platelets: 205 10*3/uL (ref 140–400)
RBC: 5.81 10*6/uL — ABNORMAL HIGH (ref 4.20–5.80)
RDW: 14.5 % (ref 11.0–15.0)
Total Lymphocyte: 28.4 %
WBC: 6.1 10*3/uL (ref 3.8–10.8)

## 2019-01-20 LAB — BASIC METABOLIC PANEL WITH GFR
BUN/Creatinine Ratio: 10 (calc) (ref 6–22)
BUN: 14 mg/dL (ref 7–25)
CO2: 20 mmol/L (ref 20–32)
Calcium: 9.3 mg/dL (ref 8.6–10.3)
Chloride: 107 mmol/L (ref 98–110)
Creat: 1.42 mg/dL — ABNORMAL HIGH (ref 0.70–1.18)
GFR, Est African American: 55 mL/min/{1.73_m2} — ABNORMAL LOW (ref >=60)
GFR, Est Non African American: 48 mL/min/{1.73_m2} — ABNORMAL LOW (ref >=60)
Glucose, Bld: 85 mg/dL (ref 65–99)
Potassium: 4 mmol/L (ref 3.5–5.3)
Sodium: 140 mmol/L (ref 135–146)

## 2019-01-20 LAB — TSH: TSH: 3.15 mIU/L (ref 0.40–4.50)

## 2019-01-20 LAB — T4, FREE: Free T4: 1.5 ng/dL (ref 0.8–1.8)

## 2019-01-23 ENCOUNTER — Other Ambulatory Visit: Payer: Self-pay

## 2019-01-24 ENCOUNTER — Encounter: Payer: Self-pay | Admitting: Family Medicine

## 2019-01-24 ENCOUNTER — Other Ambulatory Visit: Payer: Self-pay

## 2019-01-24 ENCOUNTER — Ambulatory Visit (INDEPENDENT_AMBULATORY_CARE_PROVIDER_SITE_OTHER): Payer: Medicare Other | Admitting: Family Medicine

## 2019-01-24 ENCOUNTER — Other Ambulatory Visit: Payer: Self-pay | Admitting: Family Medicine

## 2019-01-24 DIAGNOSIS — E039 Hypothyroidism, unspecified: Secondary | ICD-10-CM

## 2019-01-24 DIAGNOSIS — I129 Hypertensive chronic kidney disease with stage 1 through stage 4 chronic kidney disease, or unspecified chronic kidney disease: Secondary | ICD-10-CM

## 2019-01-24 DIAGNOSIS — N183 Chronic kidney disease, stage 3 unspecified: Secondary | ICD-10-CM

## 2019-01-24 DIAGNOSIS — Z515 Encounter for palliative care: Secondary | ICD-10-CM | POA: Diagnosis not present

## 2019-01-24 DIAGNOSIS — Z0000 Encounter for general adult medical examination without abnormal findings: Secondary | ICD-10-CM

## 2019-01-24 DIAGNOSIS — M1A0710 Idiopathic chronic gout, right ankle and foot, without tophus (tophi): Secondary | ICD-10-CM

## 2019-01-24 DIAGNOSIS — F0151 Vascular dementia with behavioral disturbance: Secondary | ICD-10-CM

## 2019-01-24 DIAGNOSIS — R7303 Prediabetes: Secondary | ICD-10-CM

## 2019-01-24 DIAGNOSIS — F01518 Vascular dementia, unspecified severity, with other behavioral disturbance: Secondary | ICD-10-CM

## 2019-01-24 NOTE — Progress Notes (Signed)
Virtual Visit via Telephone The purpose of this virtual visit is to provide medical care while limiting exposure to the novel coronavirus (COVID19) for both patient and office staff.  Consent was obtained for phone visit:  Yes.   Answered questions that patient had about telehealth interaction:  Yes.   I discussed the limitations, risks, security and privacy concerns of performing an evaluation and management service by telephone. I also discussed with the patient that there may be a patient responsible charge related to this service. The patient expressed understanding and agreed to proceed.  Patient Location: Home Provider Location: [facility])  ---------------------------------------------------------------------- Chief Complaint  Patient presents with  . Hypertension    S: Reviewed CMA documentation. I have called patient, history provided by wife, Philip Moreno, as patient unable to provide significant history due to dementia, HPI as follows:  Hypothyroidism - Last visit with me 08/2018, for annual / reviewed labs as well for same problem hypothyroidism, treated with increased levothyroxine from 25 up to 50 due to still elevated TSH result, see prior notes for background information. 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 He has been receiving Palliat[facility] from [facility], and has declined returning to Neurology. Palliatiave has contacted intermittently. - His wife is his primary caregiver  CHRONIC HTN/ CKD-III Last update - 01/19/19 lab  showed improved Creatinine down to 1.42, previously 1.62 Not always checking BP outside office Current Meds -Lisinopril 30mg  daily, Metoprolol XL 100mg  daily Reports good compliance, took meds today. Tolerating well, w/o complaints. - He is on rate control and anticoagulation Eliquis Improved water intake  Past Medical History:  Diagnosis Date  . Arthritis   . Cancer [facility]) 05/2011   prostate cancer, [facility]  . DVT (deep venous thrombosis) ([place])   . GERD (gastroesophageal reflux disease)   . Memory loss   . Pulmonary embolism ([place])   . TIA (transient ischemic attack)    Social History   Tobacco Use  . Smoking status: Never Smoker  . Smokeless tobacco: Current User    Types: Snuff, Chew  Substance Use Topics  . Alcohol use: No    Alcohol/week: 0.0 standard drinks  . Drug use: No    Current Outpatient Medications:  .  allopurinol (ZYLOPRIM) 100 MG tablet, Take 1 tablet by mouth once daily, Disp: 90 tablet, Rfl: 1 .  AMITIZA 24 MCG capsule, TAKE 1 CAPSULE BY MOUTH ONCE DAILY AS NEEDED FOR CONSTIPATION, Disp: , Rfl: 1 .  clotrimazole-betamethasone (LOTRISONE) cream, Apply topically 2 (two) times daily. For up to 1-2 weeks, may re-use daily up to 1 week as needed., Disp: 45 g, Rfl: 1 .  colchicine 0.6 MG tablet, Take only as needed for acute gout flare within 24 hours - take 2 tablets, then 1 hour later take 1 more tablet, then switch to 1 tab daily until resolve, Disp: 30 tablet, Rfl: 2 .  ELIQUIS 5 MG TABS tablet, Take 1 tablet by mouth twice daily, Disp: 60 tablet, Rfl: 5 .  levETIRAcetam (KEPPRA) 1000 MG tablet, Take 1 tablet (1,000 mg total) by  mouth 2 (two) times daily., Disp: 180 tablet, Rfl: 1 .  levothyroxine (SYNTHROID, LEVOTHROID) 50 MCG tablet, Take 1 tablet (50 mcg total) by mouth daily before breakfast., Disp: 90 tablet, Rfl: 1 .  lisinopril (PRINIVIL,ZESTRIL) 30 MG tablet, Take 1 tablet (30 mg total) by mouth daily., Disp: 90 tablet, Rfl: 1 .  metoprolol  succinate (TOPROL-XL) 100 MG 24 hr tablet, Take 1 tablet (100 mg total) by mouth daily. Take with or immediately following a meal., Disp: 90 tablet, Rfl: 3 .  mirtazapine (REMERON SOL-TAB) 15 MG disintegrating tablet, Take 1 tablet (15 mg total) by mouth at bedtime., Disp: 30 tablet, Rfl: 11  Depression screen [facility] 2/9 01/24/2019 08/12/2018 05/18/2018  Decreased Interest 0 0 0  Down, Depressed, Hopeless 0 0 0  PHQ - 2 Score 0 0 0    No flowsheet data found.  -------------------------------------------------------------------------- O: No physical exam performed due to remote telephone encounter.  Lab results reviewed.  Recent Results (from the past 2160 hour(s))  T4, free     Status: None   Collection Time: 01/19/19 10:28 AM  Result Value Ref Range   Free T4 1.5 0.8 - 1.8 ng/dL  TSH     Status: None   Collection Time: 01/19/19 10:28 AM  Result Value Ref Range   TSH 3.15 0.40 - 4.50 mIU/L  BASIC METABOLIC PANEL WITH GFR     Status: Abnormal   Collection Time: 01/19/19 10:28 AM  Result Value Ref Range   Glucose, Bld 85 65 - 99 mg/dL    Comment: .            Fasting reference interval .    BUN 14 7 - 25 mg/dL   Creat 1.42 (H) 0.70 - 1.18 mg/dL    Comment: For patients >69 years of age, the reference limit for Creatinine is approximately 13% higher for people identified as African-American. .    GFR, Est Non African American 48 (L) > OR = 60 mL/min/1.32m2   GFR, Est African American 55 (L) > OR = 60 mL/min/1.74m2   BUN/Creatinine Ratio 10 6 - 22 (calc)   Sodium 140 135 - 146 mmol/L   Potassium 4.0 3.5 - 5.3 mmol/L   Chloride 107 98 - 110 mmol/L   CO2 20 20 - 32 mmol/L   Calcium 9.3 8.6 - 10.3 mg/dL  CBC with Differential/Platelet     Status: Abnormal   Collection Time: 01/19/19 10:28 AM  Result Value Ref Range   WBC 6.1 3.8 - 10.8 Thousand/uL   RBC 5.81 (H) 4.20 - 5.80 Million/uL   Hemoglobin 16.1 13.2 - 17.1 g/dL   HCT 48.2 38.5 - 50.0 %   MCV 83.0 80.0 - 100.0 fL    MCH 27.7 27.0 - 33.0 pg   MCHC 33.4 32.0 - 36.0 g/dL   RDW 14.5 11.0 - 15.0 %   Platelets 205 140 - 400 Thousand/uL   MPV 12.0 7.5 - 12.5 fL   Neutro Abs 3,508 1,500 - 7,800 cells/uL   Lymphs Abs 1,732 850 - 3,900 cells/uL   Absolute Monocytes 726 200 - 950 cells/uL   Eosinophils Absolute 104 15 - 500 cells/uL   Basophils Absolute 31 0 - 200 cells/uL   Neutrophils Relative % 57.5 %   Total Lymphocyte 28.4 %   Monocytes Relative 11.9 %   Eosinophils Relative 1.7 %   Basophils Relative 0.5 %    -------------------------------------------------------------------------- A&P:  Problem List Items Addressed This Visit    Benign hypertension with  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 Continue current BP regimen - Lisinopril 30mg  daily, Metoprolol XL 100mg  daily 2. Encourage improved lifestyle - emphasis on low sodium diet, regular exercise 3. monitor BP outside office, bring readings to next visit, if persistently >150/90 or new symptoms notify office sooner  F/u 6 months      CKD (chronic kidney disease) stage 3, GFR 30-59 ml/min (HCC)    Improved Creatinine back to baseline 1.4 Some improved water intake Continue Lisinopril for now Follow-up 6 months w/ labs      Hypothyroidism (acquired) - Primary    Improved clinically TSH down to 3.15, normal Free T4 on higher dose levothyroxine  Continue current Levothyroxine 6mcg daily Follow-up 6 months for annual w/ repeat thyroid labs      Palliative care patient      No orders of the defined types were placed in this encounter.   Follow-up: - Return in 6 months for Annual Physical - Future labs ordered for 08/2019  Patient verbalizes understanding with the above medical recommendations including the limitation of remote medical advice.  Specific follow-up and  call-back criteria were given for patient to follow-up or seek medical care more urgently if needed.   - Time spent in direct consultation with patient on phone: 9 minutes   Nobie Putnam, [facility] 01/24/2019, 9:54 AM

## 2019-01-24 NOTE — Patient Instructions (Addendum)
AVS info given by phone.  DUE for FASTING BLOOD WORK (no food or drink after midnight before the lab appointment, only water or coffee without cream/sugar on the morning of)  SCHEDULE "Lab Only" visit in the morning at the clinic for lab draw in 6 MONTHS  - Make sure Lab Only appointment is at about 1 week before your next appointment, so that results will be available  For Lab Results, once available within 2-3 days of blood draw, you can can log in to MyChart online to view your results and a brief explanation. Also, we can discuss results at next follow-up visit.   Please schedule a Follow-up Appointment to: No follow-ups on file.  If you have any other questions or concerns, please feel free to call the office or send a message through [facility]. You may also schedule an earlier appointment if necessary.  Additionally, you may be receiving a survey about your experience at our office within a few days to 1 week by e-mail or mail. We value your feedback.  Nobie Putnam, DO [place]

## 2019-01-24 NOTE — Assessment & Plan Note (Signed)
Improved clinically TSH down to 3.15, normal Free T4 on higher dose levothyroxine  Continue current Levothyroxine 49mcg daily Follow-up 6 months for annual w/ repeat thyroid labs

## 2019-01-24 NOTE — Assessment & Plan Note (Signed)
Improved Creatinine back to baseline 1.4 Some improved water intake Continue Lisinopril for now Follow-up 6 months w/ labs

## 2019-01-24 NOTE — Assessment & Plan Note (Signed)
Stable HTN - Home BP readings reported mostly normal Creatinine improved to stable CKD-III  Complication with CKD-III, Vascular Dementia    Plan:  1. Continue current BP regimen - Lisinopril 30mg  daily, Metoprolol XL 100mg  daily 2. Encourage improved lifestyle - emphasis on low sodium diet, regular exercise 3. monitor BP outside office, bring readings to next visit, if persistently >150/90 or new symptoms notify office sooner  F/u 6 months

## 2019-02-09 ENCOUNTER — Other Ambulatory Visit: Payer: Self-pay | Admitting: Family Medicine

## 2019-02-09 DIAGNOSIS — I129 Hypertensive chronic kidney disease with stage 1 through stage 4 chronic kidney disease, or unspecified chronic kidney disease: Secondary | ICD-10-CM

## 2019-02-23 ENCOUNTER — Other Ambulatory Visit: Payer: Self-pay | Admitting: Family Medicine

## 2019-02-23 DIAGNOSIS — E039 Hypothyroidism, unspecified: Secondary | ICD-10-CM

## 2019-03-13 ENCOUNTER — Telehealth: Payer: Self-pay | Admitting: Primary Care

## 2019-03-13 NOTE — Telephone Encounter (Signed)
T/c to wife for Philip Moreno. States that they will make a palliative appt for tomorrow.

## 2019-03-14 ENCOUNTER — Other Ambulatory Visit: Payer: Medicare Other | Admitting: Primary Care

## 2019-03-14 ENCOUNTER — Other Ambulatory Visit: Payer: Self-pay

## 2019-03-14 DIAGNOSIS — Z515 Encounter for palliative care: Secondary | ICD-10-CM

## 2019-03-14 NOTE — Progress Notes (Signed)
[facility] Consult Note Telephone: [phone]  Fax: [phone]   PATIENT NAME: Philip Moreno DOB: 12/10/1940 MRN: [id number]  PRIMARY CARE PROVIDER:   Olin Moreno, [place] [phone]  REFERRING PROVIDER:  Olin Hauser, DO [address] [place],  [place] [address] [phone]  RESPONSIBLE PARTY:   Extended Emergency Contact Information Primary Emergency Contact: Philip Moreno Address: [address]          [place], [place] [address] Philip Moreno of [place] Phone: [phone] Mobile Phone: [phone] Relation: Spouse Secondary Emergency Contact: Philip Moreno Address: [address]          [place], [place] [address] [place] of [place] Phone: [phone] Relation: Brother   ASSESSMENT AND RECOMMENDATIONS:   1. Advance Care Planning/Goals of Care: Goals include to maximize quality of life and symptom management. Patient has living will they reviewed within the year. There is nothing on Epic. I left MOST form after discussion for them to review and we'll complete next time. There is  DNR in the home but it is unclear as to their understanding. I will continue to engage in goals of care discussion.  2. Symptom Management:   Appetite: Eats fairly well, more selective but usually eats 100% when it's food he enjoys.  Seizure activity: Recommend review of levetiracetam dose. Reports dizziness and often has possible absence seizures. Reviewed current dose of anti epileptic. He often reports "something in his head" and appears to stare for several seconds. Has these episodes at least 2 x /day per his wife's report. These are not grand mal seizures but some sort of transient absence seizures. She state they forgot to mention to PCP on recent exam.   Mobility: Cannot use a walker, but has several at home.He tries to lift it to carry it. No falls, can ambulate by himself to bathroom alone. Wife states he does not  wander.   Constipation: Denies, does not have to use Amitiza much. Wife tries to increase leafy green vegetables and he eats fruit.  3. Family /Caregiver/Community Supports: Lives with wife and has children in area. Has been a long time resident of his current area. Established with PCP, but not seeing a neurologist currently. Wife feels well supported at this time.  4. Cognitive / Functional decline: Cognitively understands, does not speak much.  5. Follow up Palliative Care Visit: Palliative care will continue to follow for goals of care clarification and symptom management. Return 6-8 weeks or prn.  I spent 60 minutes providing this consultation,  from 1430 to 1530. More than 50% of the time in this consultation was spent coordinating communication.   HISTORY OF PRESENT ILLNESS:  Philip Moreno is a 78 y.o. year old male with multiple medical problems including memory loss, possible breakthrough seizure activity, remote CVA. Palliative Care was asked to follow this patient by consultation request of Philip Hauser, DO to help address advance care planning and goals of care. This is a follow up visit.  CODE STATUS: DNR per wife's report. Will continue to discuss MOST choices.  PPS: 40% HOSPICE ELIGIBILITY/DIAGNOSIS: TBD  PAST MEDICAL HISTORY:  Past Medical History:  Diagnosis Date  . Arthritis   . Cancer [facility]) 05/2011   prostate cancer, [facility]  . DVT (deep venous thrombosis) ([place])   . GERD (gastroesophageal reflux disease)   . Memory loss   . Pulmonary embolism ([place])   . TIA (transient ischemic attack)     SOCIAL  HX:  Social History   Tobacco Use  . Smoking status: Never Smoker  . Smokeless tobacco: Current User    Types: Snuff, Chew  Substance Use Topics  . Alcohol use: No    Alcohol/week: 0.0 standard drinks    ALLERGIES: No Known Allergies   PERTINENT MEDICATIONS:  Outpatient Encounter Medications as of 03/14/2019  Medication Sig  . allopurinol  (ZYLOPRIM) 100 MG tablet Take 1 tablet by mouth once daily  . AMITIZA 24 MCG capsule TAKE 1 CAPSULE BY MOUTH ONCE DAILY AS NEEDED FOR CONSTIPATION  . clotrimazole-betamethasone (LOTRISONE) cream Apply topically 2 (two) times daily. For up to 1-2 weeks, may re-use daily up to 1 week as needed.  . colchicine 0.6 MG tablet Take only as needed for acute gout flare within 24 hours - take 2 tablets, then 1 hour later take 1 more tablet, then switch to 1 tab daily until resolve  . ELIQUIS 5 MG TABS tablet Take 1 tablet by mouth twice daily  . EUTHYROX 50 MCG tablet TAKE 1 TABLET BY MOUTH ONCE DAILY BEFORE BREAKFAST  . levETIRAcetam (KEPPRA) 1000 MG tablet Take 1 tablet (1,000 mg total) by mouth 2 (two) times daily.  [place] Kitchen lisinopril (ZESTRIL) 30 MG tablet Take 1 tablet by mouth once daily  . metoprolol succinate (TOPROL-XL) 100 MG 24 hr tablet Take 1 tablet (100 mg total) by mouth daily. Take with or immediately following a meal.  . mirtazapine (REMERON SOL-TAB) 15 MG disintegrating tablet Take 1 tablet (15 mg total) by mouth at bedtime.   No facility-administered encounter medications on file as of 03/14/2019.     PHYSICAL EXAM/ROS:   Current and past weights: unavailable General: NAD, frail appearing, thin Cardiovascular: no chest pain reported, no edema Pulmonary: no cough, no increased SOB Abdomen: appetite fair to good, denies constipation, continent of bowel GU: denies dysuria, continent of urine MSK:  no joint deformities, ambulates without walker Skin: no rashes or wounds reported Neurological: Weakness, fatigue, dozes during interview. Verbal but only answers a few one word answers.  Philip Skeeters DNP AGPCNP-BC  COVID-19 PATIENT SCREENING TOOL  Person answering questions: ________Wife___________ _____   1.  Is the patient or any family member in the home showing any signs or symptoms regarding respiratory infection?               Person with Symptom- ___________________________  a.  Fever                                                                          Yes___ No_x__          ___________________  b. Shortness of breath                                                    Yes___ No__x_          ___________________ c. Cough/congestion  Yes___  No_x__         ___________________ d. Body aches/pains                                                         Yes___ No_x__        ____________________ e. Gastrointestinal symptoms (diarrhea, nausea)           Yes___ No__x_        ____________________  2. Within the past 14 days, has anyone living in the home had any contact with someone with or under investigation for COVID-19?    Yes___ [place]__   Person __________________

## 2019-04-17 ENCOUNTER — Other Ambulatory Visit: Payer: Self-pay | Admitting: Family Medicine

## 2019-04-17 DIAGNOSIS — I10 Essential (primary) hypertension: Secondary | ICD-10-CM

## 2019-04-17 DIAGNOSIS — M1A0710 Idiopathic chronic gout, right ankle and foot, without tophus (tophi): Secondary | ICD-10-CM

## 2019-04-17 DIAGNOSIS — I4819 Other persistent atrial fibrillation: Secondary | ICD-10-CM

## 2019-04-18 ENCOUNTER — Other Ambulatory Visit: Payer: Self-pay | Admitting: Family Medicine

## 2019-04-18 DIAGNOSIS — B356 Tinea cruris: Secondary | ICD-10-CM

## 2019-04-18 NOTE — Telephone Encounter (Signed)
Pt call reqeusting refill on colchicine-betamethasone cream

## 2019-04-24 ENCOUNTER — Other Ambulatory Visit: Payer: Self-pay | Admitting: Family Medicine

## 2019-04-24 DIAGNOSIS — I4819 Other persistent atrial fibrillation: Secondary | ICD-10-CM

## 2019-04-24 DIAGNOSIS — Z86711 Personal history of pulmonary embolism: Secondary | ICD-10-CM

## 2019-05-16 ENCOUNTER — Other Ambulatory Visit: Payer: Self-pay | Admitting: Family Medicine

## 2019-05-16 DIAGNOSIS — B356 Tinea cruris: Secondary | ICD-10-CM

## 2019-05-16 NOTE — Telephone Encounter (Signed)
Pt called requesting refill on clotrimazole-batamethasone called into [facility]

## 2019-05-18 MED ORDER — CLOTRIMAZOLE-BETAMETHASONE 1-0.05 % EX CREA: TOPICAL_CREAM | Freq: Two times a day (BID) | CUTANEOUS | 1 refills | Status: AC

## 2019-06-20 ENCOUNTER — Telehealth: Payer: Self-pay | Admitting: Primary Care

## 2019-06-20 NOTE — Telephone Encounter (Signed)
T/c to schedule palliative visit. No answer, message left.

## 2019-06-21 ENCOUNTER — Other Ambulatory Visit: Payer: Self-pay | Admitting: Family Medicine

## 2019-06-21 DIAGNOSIS — R569 Unspecified convulsions: Secondary | ICD-10-CM

## 2019-06-26 ENCOUNTER — Other Ambulatory Visit: Payer: Medicare Other | Admitting: Primary Care

## 2019-06-26 ENCOUNTER — Other Ambulatory Visit: Payer: Self-pay

## 2019-06-26 DIAGNOSIS — Z515 Encounter for palliative care: Secondary | ICD-10-CM

## 2019-06-26 NOTE — Progress Notes (Signed)
[facility] Consult Note Telephone: [phone]  Fax: [phone]    PATIENT NAME: Philip Moreno [address] [place] [address] [phone] (home)  DOB: [birth year] MRN: [id number]  PRIMARY CARE PROVIDER:   Olin Hauser, DO, [address] [place] [address] [phone]  REFERRING PROVIDER:  Olin Hauser, DO [address] [place],  [place] [address] [phone]  RESPONSIBLE PARTY:   Extended Emergency Contact Information Primary Emergency Contact: Philip Moreno Address: [address]          [place], [place] [address] Johnnette [place] of [place] Phone: [phone] Mobile Phone: [phone] Relation: Spouse Secondary Emergency Contact: [place],Phillip Address: [address]          [place], [place] [address] [place] Phone: [phone] Relation: Brother   ASSESSMENT AND RECOMMENDATIONS:   1. Advance Care Planning/Goals of Care: Goals include to maximize quality of life and symptom management. MOST form left. We reviewed the options and she will discuss with her family.  2. Symptom Management:   Medications: Reviewed all meds and side effects. Discussed decreasing eliquis dose due to cost, given age and renal function. We also discussed risk for further stroke with not anti coagulating and the treatment burdens of coumadin (checking INR frequently) in light of lower cost. They will discuss with PCP.   Safety: Ambulates in home and able to preform feeding and toileting adls. Can answer calls but cannot place. Needs monitoring and supervision for safety.  3. Family /Caregiver/Community Supports: Lives with wife. She has children from previous marriage but he does not. They often keep a great grandchild who gives him much joy. Family , community and church are supports.  4. Cognitive / Functional decline: Short term memory loss. At functional baseline. Able to answer phone, cannot dial. Can  get drinks and snacks but not make a meal.   5. Follow up Palliative Care Visit: Palliative care will continue to follow for goals of care clarification and symptom management. Return 8 weeks or prn.  I spent 60 minutes providing this consultation,  from 1230 to 1330. More than 50% of the time in this consultation was spent coordinating communication.   HISTORY OF PRESENT ILLNESS:  Philip Moreno is a 78 y.o. year old male with multiple medical problems including H/o PE, TIA, dementia. Palliative Care was asked to follow this patient by consultation request of Nobie Putnam * to help address advance care planning and goals of care. This is a follow up visit.  CODE STATUS: TBD, MOST left  PPS: 30% HOSPICE ELIGIBILITY/DIAGNOSIS: no  PAST MEDICAL HISTORY:  Past Medical History:  Diagnosis Date  . Arthritis   . Cancer [facility]) 05/2011   prostate cancer, [facility]  . DVT (deep venous thrombosis) ([place])   . GERD (gastroesophageal reflux disease)   . Memory loss   . Pulmonary embolism ([place])   . TIA (transient ischemic attack)     SOCIAL HX:  Social History   Tobacco Use  . Smoking status: Never Smoker  . Smokeless tobacco: Current User    Types: Snuff, Chew  Substance Use Topics  . Alcohol use: No    Alcohol/week: 0.0 standard drinks    ALLERGIES: No Known Allergies   PERTINENT MEDICATIONS:  Outpatient Encounter Medications as of 06/26/2019  Medication Sig  . allopurinol (ZYLOPRIM) 100 MG tablet Take 1 tablet by mouth once daily  . AMITIZA 24 MCG capsule TAKE 1 CAPSULE BY  MOUTH ONCE DAILY AS NEEDED FOR CONSTIPATION  . apixaban (ELIQUIS) 5 MG TABS tablet Take 1 tablet (5 mg total) by mouth 2 (two) times daily.  . clotrimazole-betamethasone (LOTRISONE) cream Apply topically 2 (two) times daily. For up to 1-2 weeks, may re-use daily up to 1 week as needed.  . colchicine 0.6 MG tablet Take only as needed for acute gout flare within 24 hours - take 2 tablets, then 1 hour later  take 1 more tablet, then switch to 1 tab daily until resolve  . EUTHYROX 50 MCG tablet TAKE 1 TABLET BY MOUTH ONCE DAILY BEFORE BREAKFAST  . levETIRAcetam (KEPPRA) 1000 MG tablet Take 1 tablet by mouth twice daily  . lisinopril (ZESTRIL) 30 MG tablet Take 1 tablet by mouth once daily  . metoprolol succinate (TOPROL-XL) 100 MG 24 hr tablet TAKE 1 TABLET BY MOUTH ONCE DAILY. TAKE WITH OR IMMEDIATELY FOLLOWING A MEAL.  . mirtazapine (REMERON SOL-TAB) 15 MG disintegrating tablet Take 1 tablet (15 mg total) by mouth at bedtime.   No facility-administered encounter medications on file as of 06/26/2019.     PHYSICAL EXAM / ROS:   Current and past weights: unavailable.  General: NAD, frail appearing, thin, denies pain now   Cardiovascular: no chest pain reported, no edema, S1S2 Pulmonary: no cough, no increased SOB, Room air, Lungs clear Abdomen: appetite good, denies constipation, continent of bowel GU: denies dysuria, continent of urine, prostatectomy 2014. MSK:  no joint deformities, ambulatory in home (I), Recently had gout flare in L heel, but resolved, Fell x 1 in the night and had to call his brother to get him up.  Was using w/c with gout flare.has rollator and standard walker but tries to lift it and carry it.  Skin: fungal rash rx with anti fungal cream Neurological: Weakness, R sided weakness, sleep is good. No seizures. Memory good for past, short term memory loss per wife.   Jason Coop, NP    COVID-19 PATIENT SCREENING TOOL  Person answering questions: __________wife_________ _____   1.  Is the patient or any family member in the home showing any signs or symptoms regarding respiratory infection?               Person with Symptom- _______NA____________________  a. Fever                                                                          Yes___ No___          ___________________  b. Shortness of breath                                                    Yes___  No___          ___________________ c. Cough/congestion                                       Yes___  No___         ___________________ d. Body aches/pains  Yes___ No___        ____________________ e. Gastrointestinal symptoms (diarrhea, nausea)           Yes___ No___        ____________________  2. Within the past 14 days, has anyone living in the home had any contact with someone with or under investigation for COVID-19?    Yes___ No_x_   Person __________________

## 2019-07-04 ENCOUNTER — Ambulatory Visit (INDEPENDENT_AMBULATORY_CARE_PROVIDER_SITE_OTHER): Payer: Medicare Other

## 2019-07-04 VITALS — Ht 75.0 in | Wt 187.0 lb

## 2019-07-04 DIAGNOSIS — R569 Unspecified convulsions: Secondary | ICD-10-CM

## 2019-07-04 DIAGNOSIS — F0151 Vascular dementia with behavioral disturbance: Secondary | ICD-10-CM | POA: Diagnosis not present

## 2019-07-04 DIAGNOSIS — F01518 Vascular dementia, unspecified severity, with other behavioral disturbance: Secondary | ICD-10-CM

## 2019-07-04 DIAGNOSIS — Z0000 Encounter for general adult medical examination without abnormal findings: Secondary | ICD-10-CM | POA: Diagnosis not present

## 2019-07-04 DIAGNOSIS — Z515 Encounter for palliative care: Secondary | ICD-10-CM

## 2019-07-04 NOTE — Patient Instructions (Signed)
Philip Moreno , Thank you for taking time to come for your Medicare Wellness Visit. I appreciate your ongoing commitment to your health goals. Please review the following plan we discussed and let me know if I can assist you in the future.   Screening recommendations/referrals: Colonoscopy: no longer required Recommended yearly ophthalmology/optometry visit for glaucoma screening and checkup Recommended yearly dental visit for hygiene and checkup  Vaccinations: Influenza vaccine: declined Pneumococcal vaccine: declined Tdap vaccine: declined Shingles vaccine: declined     Advanced directives: Please bring a copy of your health care power of attorney and living will to the office at your convenience.  Conditions/risks identified: referral placed to chronic care management team   Next appointment: follow up in one year for your annual wellness visit- 07/10/2019 at 10:00am. (78 years old)  Preventive Care 65 Years and Older, Male Preventive care refers to lifestyle choices and visits with your health care provider that can promote health and wellness. What does preventive care include?  A yearly physical exam. This is also called an annual well check.  Dental exams once or twice a year.  Routine eye exams. Ask your health care provider how often you should have your eyes checked.  Personal lifestyle choices, including:  Daily care of your teeth and gums.  Regular physical activity.  Eating a healthy diet.  Avoiding tobacco and drug use.  Limiting alcohol use.  Practicing safe sex.  Taking low doses of aspirin every day.  Taking vitamin and mineral supplements as recommended by your health care provider. What happens during an annual well check? The services and screenings done by your health care provider during your annual well check will depend on your age, overall health, lifestyle risk factors, and family history of disease. Counseling  Your health care provider may ask you questions  about your:  Alcohol use.  Tobacco use.  Drug use.  Emotional well-being.  Home and relationship well-being.  Sexual activity.  Eating habits.  History of falls.  Memory and ability to understand (cognition).  Work and work Statistician. Screening  You may have the following tests or measurements:  Height, weight, and BMI.  Blood pressure.  Lipid and cholesterol levels. These may be checked every 5 years, or more frequently if you are over 53 years old.  Skin check.  Lung cancer screening. You may have this screening every year starting at age 71 if you have a 30-pack-year history of smoking and currently smoke or have quit within the past 15 years.  Fecal occult blood test (FOBT) of the stool. You may have this test every year starting at age 6.  Flexible sigmoidoscopy or colonoscopy. You may have a sigmoidoscopy every 5 years or a colonoscopy every 10 years starting at age 70.  Prostate cancer screening. Recommendations will vary depending on your family history and other risks.  Hepatitis C blood test.  Hepatitis B blood test.  Sexually transmitted disease (STD) testing.  Diabetes screening. This is done by checking your blood sugar (glucose) after you have not eaten for a while (fasting). You may have this done every 1-3 years.  Abdominal aortic aneurysm (AAA) screening. You may need this if you are a current or former smoker.  Osteoporosis. You may be screened starting at age 58 if you are at high risk. Talk with your health care provider about your test results, treatment options, and if necessary, the need for more tests. Vaccines  Your health care provider may recommend certain vaccines, such as:  Influenza vaccine.  This is recommended every year.  Tetanus, diphtheria, and acellular pertussis (Tdap, Td) vaccine. You may need a Td booster every 10 years.  Zoster vaccine. You may need this after age 37.  Pneumococcal 13-valent conjugate (PCV13)  vaccine. One dose is recommended after age 44.  Pneumococcal polysaccharide (PPSV23) vaccine. One dose is recommended after age 69. Talk to your health care provider about which screenings and vaccines you need and how often you need them. This information is not intended to replace advice given to you by your health care provider. Make sure you discuss any questions you have with your health care provider. Document Released: 08/16/2015 Document Revised: 04/08/2016 Document Reviewed: 05/21/2015 Elsevier Interactive Patient Education  2017 [place] Prevention in the Home Falls can cause injuries. They can happen to people of all ages. There are many things you can do to make your home safe and to help prevent falls. What can I do on the outside of my home?  Regularly fix the edges of walkways and driveways and fix any cracks.  Remove anything that might make you trip as you walk through a door, such as a raised step or threshold.  Trim any bushes or trees on the path to your home.  Use bright outdoor lighting.  Clear any walking paths of anything that might make someone trip, such as rocks or tools.  Regularly check to see if handrails are loose or broken. Make sure that both sides of any steps have handrails.  Any raised decks and porches should have guardrails on the edges.  Have any leaves, snow, or ice cleared regularly.  Use sand or salt on walking paths during winter.  Clean up any spills in your garage right away. This includes oil or grease spills. What can I do in the bathroom?  Use night lights.  Install grab bars by the toilet and in the tub and shower. Do not use towel bars as grab bars.  Use non-skid mats or decals in the tub or shower.  If you need to sit down in the shower, use a plastic, non-slip stool.  Keep the floor dry. Clean up any water that spills on the floor as soon as it happens.  Remove soap buildup in the tub or shower regularly.   Attach bath mats securely with double-sided non-slip rug tape.  Do not have throw rugs and other things on the floor that can make you trip. What can I do in the bedroom?  Use night lights.  Make sure that you have a light by your bed that is easy to reach.  Do not use any sheets or blankets that are too big for your bed. They should not hang down onto the floor.  Have a firm chair that has side arms. You can use this for support while you get dressed.  Do not have throw rugs and other things on the floor that can make you trip. What can I do in the kitchen?  Clean up any spills right away.  Avoid walking on wet floors.  Keep items that you use a lot in easy-to-reach places.  If you need to reach something above you, use a strong step stool that has a grab bar.  Keep electrical cords out of the way.  Do not use floor polish or wax that makes floors slippery. If you must use wax, use non-skid floor wax.  Do not have throw rugs and other things on the floor that can make  you trip. What can I do with my stairs?  Do not leave any items on the stairs.  Make sure that there are handrails on both sides of the stairs and use them. Fix handrails that are broken or loose. Make sure that handrails are as long as the stairways.  Check any carpeting to make sure that it is firmly attached to the stairs. Fix any carpet that is loose or worn.  Avoid having throw rugs at the top or bottom of the stairs. If you do have throw rugs, attach them to the floor with carpet tape.  Make sure that you have a light switch at the top of the stairs and the bottom of the stairs. If you do not have them, ask someone to add them for you. What else can I do to help prevent falls?  Wear shoes that:  Do not have high heels.  Have rubber bottoms.  Are comfortable and fit you well.  Are closed at the toe. Do not wear sandals.  If you use a stepladder:  Make sure that it is fully opened. Do not climb  a closed stepladder.  Make sure that both sides of the stepladder are locked into place.  Ask someone to hold it for you, if possible.  Clearly mark and make sure that you can see:  Any grab bars or handrails.  First and last steps.  Where the edge of each step is.  Use tools that help you move around (mobility aids) if they are needed. These include:  Canes.  Walkers.  Scooters.  Crutches.  Turn on the lights when you go into a dark area. Replace any light bulbs as soon as they burn out.  Set up your furniture so you have a clear path. Avoid moving your furniture around.  If any of your floors are uneven, fix them.  If there are any pets around you, be aware of where they are.  Review your medicines with your doctor. Some medicines can make you feel dizzy. This can increase your chance of falling. Ask your doctor what other things that you can do to help prevent falls. This information is not intended to replace advice given to you by your health care provider. Make sure you discuss any questions you have with your health care provider. Document Released: 05/16/2009 Document Revised: 12/26/2015 Document Reviewed: 08/24/2014 Elsevier Interactive Patient Education  2017 [employer].

## 2019-07-04 NOTE — Progress Notes (Signed)
Subjective:   Philip Moreno is a 78 y.o. male who presents for an Initial Medicare Annual Wellness Visit.  This visit is being conducted via phone call  - after an attmept to do on video chat - due to the COVID-19 pandemic. This patient has given me verbal consent via phone to conduct this visit, patient states they are participating from their home address. Some vital signs may be absent or patient reported.   Patient identification: identified by name, DOB, and current address.    Review of Systems      Cardiac Risk Factors include: advanced age (>54men, >38 women);dyslipidemia;hypertension;smoking/ tobacco exposure;male gender;family history of premature cardiovascular disease     Objective:    Today's Vitals   07/04/19 1009  Weight: 187 lb (84.8 kg)  Height: 6\' 3"  (1.905 m)   Body mass index is 23.37 kg/m.  Advanced Directives 07/04/2019 06/14/2017 06/13/2017 01/22/2017 10/09/2016 01/10/2016 01/09/2016  Does Patient Have a Medical Advance Directive? Yes - No No No No No  Type of Advance Directive Living will;Healthcare Power of Attorney - - - - - -  Copy of [place] in Chart? No - copy requested - - - - - -  Would patient like information on creating a medical advance directive? - No - Patient declined - No - Patient declined - No - patient declined information No - patient declined information  Pre-existing out of facility DNR order (yellow form or pink MOST form) - - - - - - -    Current Medications (verified) Outpatient Encounter Medications as of 07/04/2019  Medication Sig  . allopurinol (ZYLOPRIM) 100 MG tablet Take 1 tablet by mouth once daily  . AMITIZA 24 MCG capsule TAKE 1 CAPSULE BY MOUTH ONCE DAILY AS NEEDED FOR CONSTIPATION  . apixaban (ELIQUIS) 5 MG TABS tablet Take 1 tablet (5 mg total) by mouth 2 (two) times daily.  . clotrimazole-betamethasone (LOTRISONE) cream Apply topically 2 (two) times daily. For up to 1-2 weeks, may re-use daily up to 1  week as needed.  Arna Medici 50 MCG tablet TAKE 1 TABLET BY MOUTH ONCE DAILY BEFORE BREAKFAST  . levETIRAcetam (KEPPRA) 1000 MG tablet Take 1 tablet by mouth twice daily  . lisinopril (ZESTRIL) 30 MG tablet Take 1 tablet by mouth once daily  . metoprolol succinate (TOPROL-XL) 100 MG 24 hr tablet TAKE 1 TABLET BY MOUTH ONCE DAILY. TAKE WITH OR IMMEDIATELY FOLLOWING A MEAL.  . mirtazapine (REMERON SOL-TAB) 15 MG disintegrating tablet Take 1 tablet (15 mg total) by mouth at bedtime.  . colchicine 0.6 MG tablet Take only as needed for acute gout flare within 24 hours - take 2 tablets, then 1 hour later take 1 more tablet, then switch to 1 tab daily until resolve   No facility-administered encounter medications on file as of 07/04/2019.     Allergies (verified) Patient has no known allergies.   History: Past Medical History:  Diagnosis Date  . Arthritis   . Cancer [facility]) 05/2011   prostate cancer, [facility]  . DVT (deep venous thrombosis) ([place])   . GERD (gastroesophageal reflux disease)   . Memory loss   . Pulmonary embolism ([place])   . TIA (transient ischemic attack)    Past Surgical History:  Procedure Laterality Date  . COLONOSCOPY WITH PROPOFOL N/A 02/27/2015   Procedure: COLONOSCOPY WITH PROPOFOL;  Surgeon: Christene Lye, MD;  Location: [facility] ENDOSCOPY;  Service: Endoscopy;  Laterality: N/A;  . LYMPHADENECTOMY  08/25/2012  Procedure: LYMPHADENECTOMY;  Surgeon: Dutch Gray, MD;  Location: [facility] ORS;  Service: Urology;  Laterality: Bilateral;  . PROSTATE SURGERY  2012  . ROBOT ASSISTED LAPAROSCOPIC RADICAL PROSTATECTOMY  08/25/2012   Procedure: ROBOTIC ASSISTED LAPAROSCOPIC RADICAL PROSTATECTOMY LEVEL 2;  Surgeon: Dutch Gray, MD;  Location: [facility] ORS;  Service: Urology;  Laterality: N/A;   Family History  Problem Relation Age of Onset  . Hypertension Mother   . Heart attack Father   . Cancer Brother   . Heart failure Brother   . Diabetes Brother    Social History    Socioeconomic History  . Marital status: Married    Spouse name: Jamelle Haring   . Number of children: 0  . Years of education: 7  . Highest education level: 7th grade  Occupational History  . Not on file  Social Needs  . Financial resource strain: Not hard at all  . Food insecurity    Worry: Never true    Inability: Never true  . Transportation needs    Medical: No    Non-medical: No  Tobacco Use  . Smoking status: Never Smoker  . Smokeless tobacco: Current User    Types: Chew  Substance and Sexual Activity  . Alcohol use: No    Alcohol/week: 0.0 standard drinks  . Drug use: No  . Sexual activity: Not on file  Lifestyle  . Physical activity    Days per week: 0 days    Minutes per session: 0 min  . Stress: Not at all  Relationships  . Social Herbalist on phone: Not on file    Gets together: More than three times a week    Attends religious service: Never    Active member of club or organization: No    Attends meetings of clubs or organizations: Never    Relationship status: Married  Other Topics Concern  . Not on file  Social History Narrative   Patient lives at home with his wife    Patient has a 7th grade education.    Patient has no children.    Caffeine use: daily   Right handed    Tobacco Counseling Ready to quit: Not Answered Counseling given: Not Answered   Clinical Intake:  Pre-visit preparation completed: Yes  Pain : No/denies pain     Nutritional Status: BMI of 19-24  Normal Nutritional Risks: None Diabetes: No  How often do you need to have someone help you when you read instructions, pamphlets, or other written materials from your doctor or pharmacy?: 1 - Never  Interpreter Needed?: No  Information entered by :: Tiffany Hill,LPN   Activities of Daily Living In your present state of health, do you have any difficulty performing the following activities: 07/04/2019  Hearing? N  Vision? Y  Difficulty concentrating or making  decisions? Y  Walking or climbing stairs? Y  Dressing or bathing? Y  Doing errands, shopping? Y  Preparing Food and eating ? Y  Using the Toilet? N  In the past six months, have you accidently leaked urine? N  Do you have problems with loss of bowel control? N  Managing your Medications? Y  Managing your Finances? Y  Housekeeping or managing your Housekeeping? Y  Some recent data might be hidden     Immunizations and Health Maintenance  There is no immunization history on file for this patient. There are no preventive care reminders to display for this patient.  Patient Care Team: Olin Hauser, DO  as PCP - General (Family Medicine) Christene Lye, MD (General Surgery) Jason Coop, NP as Nurse Practitioner  Indicate any recent Medical Services you may have received from other than [facility] providers in the past year (date may be approximate).     Assessment:   This is a routine wellness examination for Theryn.  Hearing/Vision screen No exam data present  Dietary issues and exercise activities discussed: Current Exercise Habits: The patient does not participate in regular exercise at present, Exercise limited by: neurologic condition(s)  Goals   None    Depression Screen PHQ 2/9 Scores 07/04/2019 01/24/2019 08/12/2018 05/18/2018 04/08/2018 03/11/2018 02/11/2018  PHQ - 2 Score 0 0 0 0 0 0 0    Fall Risk Fall Risk  07/04/2019 01/24/2019 08/12/2018 05/18/2018 04/08/2018  Falls in the past year? 1 0 1 No No  Number falls in past yr: 1 - 1 - -  Injury with Fall? 0 - 1 - -  Risk for fall due to : Impaired balance/gait - - - -  Follow up - Falls evaluation completed Follow up appointment - -   FALL RISK PREVENTION PERTAINING TO THE HOME:  Any stairs in or around the home? Yes  steps going in home, ramp on there  If so, are there any without handrails? Yes   Home free of loose throw rugs in walkways, pet beds, electrical cords, etc? Yes  Adequate  lighting in your home to reduce risk of falls? Yes   ASSISTIVE DEVICES UTILIZED TO PREVENT FALLS:  Life alert? No  Use of a cane, walker or w/c? No  Grab bars in the bathroom? Yes  Shower chair or bench in shower? Yes  Elevated toilet seat or a handicapped toilet? Yes    DME ORDERS:  DME order needed?  No , CCM referral placed   TIMED UP AND GO:  unable to perform    Cognitive Function: MMSE - Mini Mental State Exam 08/31/2017  Orientation to time 1  Orientation to Place 3  Registration 3  Attention/ Calculation 0  Recall 1  Language- name 2 objects 2  Language- repeat 0  Language- follow 3 step command 2  Language- follow 3 step command-comments took paper in L hand  Language- read & follow direction 1  Write a sentence 0  Write a sentence-comments refused  Copy design 0  Copy design-comments refused  Total score 13   Montreal Cognitive Assessment  05/14/2014  Visuospatial/ Executive (0/5) 1  Naming (0/3) 3  Attention: Read list of digits (0/2) 1  Attention: Read list of letters (0/1) 1  Attention: Serial 7 subtraction starting at 100 (0/3) 0  Language: Repeat phrase (0/2) 1  Language : Fluency (0/1) 0  Abstraction (0/2) 0  Delayed Recall (0/5) 1  Orientation (0/6) 5  Total 13  Adjusted Score (based on education) 14      Screening Tests Health Maintenance  Topic Date Due  . TETANUS/TDAP  07/03/2020 (Originally 08/18/1959)  . PNA vac Low Risk Adult (1 of 2 - PCV13) 07/03/2020 (Originally 08/19/2007)  . INFLUENZA VACCINE  Discontinued    Qualifies for Shingles Vaccine? Yes, declined  Tdap: declined.  Flu Vaccine: declined  Pneumococcal Vaccine: declined  Cancer Screenings:  Colorectal Screening: not indicated.  Lung Cancer Screening: (Low Dose CT Chest recommended if Age 76-80 years, 30 pack-year currently smoking OR have quit w/in 15years.) does not qualify.    Additional Screening:  Hepatitis C Screening: does not qualify  Vision  Screening:  Recommended annual ophthalmology exams for early detection of glaucoma and other disorders of the eye. Is the patient up to date with their annual eye exam?  Yes  Who is the provider or what is the name of the office in which the pt attends annual eye exams? [facility]    Dental Screening: Recommended annual dental exams for proper oral hygiene  Community Resource Referral:  CRR required this visit?  No       Plan:  I have personally reviewed and addressed the Medicare Annual Wellness questionnaire and have noted the following in the patient's chart:  A. Medical and social history B. Use of alcohol, tobacco or illicit drugs  C. Current medications and supplements D. Functional ability and status E.  Nutritional status F.  Physical activity G. Advance directives H. List of other physicians I.  Hospitalizations, surgeries, and ER visits in previous 12 months J.  [place] such as hearing and vision if needed, cognitive and depression L. Referrals and appointments   In addition, I have reviewed and discussed with patient certain preventive protocols, quality metrics, and best practice recommendations. A written personalized care plan for preventive services as well as general preventive health recommendations were provided to patient.   Signed,    Bevelyn Ngo, LPN   [id number]  Nurse Health Advisor   Nurse Notes: eliquis is very expensive, requesting to change or decrease dosage. Informed of new chronic care management team pharmacist, referral placed to see if we can get assistance on medication before changing.

## 2019-07-05 ENCOUNTER — Ambulatory Visit: Payer: Self-pay | Admitting: Pharmacist

## 2019-07-05 DIAGNOSIS — I4819 Other persistent atrial fibrillation: Secondary | ICD-10-CM

## 2019-07-05 NOTE — Chronic Care Management (AMB) (Signed)
Chronic Care Management   Follow Up Note   07/05/2019 Name: Philip Moreno MRN: [id number] DOB: [birth year]  Referred by: Philip Hauser, DO Reason for referral : Chronic Care Management (Initial Patient Outreach)   Philip Moreno is a 78 y.o. year old male who is a primary care patient of Philip Hauser, DO. The CCM team was consulted for assistance with chronic disease management and care coordination needs.    Receive referral from Philip Moreno requesting outreach to patient's wife/caregiver, Philip Moreno, for medication assistance with cost of Eliquis.  Receive call back from patient's wife/caregiver today.  Review of patient status, including review of consultants reports, relevant laboratory and other test results, and collaboration with appropriate care team members and the patient's provider was performed as part of comprehensive patient evaluation and provision of chronic care management services.     Outpatient Encounter Medications as of 07/05/2019  Medication Sig Note  . allopurinol (ZYLOPRIM) 100 MG tablet Take 1 tablet by mouth once daily   . AMITIZA 24 MCG capsule TAKE 1 CAPSULE BY MOUTH ONCE DAILY AS NEEDED FOR CONSTIPATION   . apixaban (ELIQUIS) 5 MG TABS tablet Take 1 tablet (5 mg total) by mouth 2 (two) times daily.   . clotrimazole-betamethasone (LOTRISONE) cream Apply topically 2 (two) times daily. For up to 1-2 weeks, may re-use daily up to 1 week as needed.   . colchicine 0.6 MG tablet Take only as needed for acute gout flare within 24 hours - take 2 tablets, then 1 hour later take 1 more tablet, then switch to 1 tab daily until resolve   . EUTHYROX 50 MCG tablet TAKE 1 TABLET BY MOUTH ONCE DAILY BEFORE BREAKFAST 07/04/2019: levothyroxine  . levETIRAcetam (KEPPRA) 1000 MG tablet Take 1 tablet by mouth twice daily   . lisinopril (ZESTRIL) 30 MG tablet Take 1 tablet by mouth once daily   . metoprolol succinate (TOPROL-XL) 100 MG 24 hr tablet  TAKE 1 TABLET BY MOUTH ONCE DAILY. TAKE WITH OR IMMEDIATELY FOLLOWING A MEAL.   . mirtazapine (REMERON SOL-TAB) 15 MG disintegrating tablet Take 1 tablet (15 mg total) by mouth at bedtime.    No facility-administered encounter medications on file as of 07/05/2019.     Goals Addressed            This Visit's Progress   . Medication Assistance       Current Barriers:  . Financial Barriers: patient has [place] [employer] insurance and reports copay for Eliquis is cost prohibitive at this time  Pharmacist Clinical Goal(s):  Marland Kitchen Over the next 30 days, patient will work with PharmD and providers to relieve medication access concerns  Interventions: . Interview patient's wife/caregiver regarding financial barrier for Eliquis o Reports patient having a copayment >$100 monthly since beginning of calendar year . Counsel patient on the importance of taking Eliquis as directed. o Reports has recently started to give patient Eliquis just once daily in order to ration medication.  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 Attempt to reach Philip Moreno for further information. However, pharmacy phone lines not working; drop call x 2.  o  Advise Philip Moreno that I will follow up again tomorrow afternoon.   Patient Self Care Activities:  . Patient attends medical appointments as scheduled . Patient calls provider office for any new medication questions/concerns  Initial goal documentation        Plan  Telephone follow up appointment with care management team member scheduled for: 07/06/2019  Philip Moreno, PharmD, [facility] [phone]

## 2019-07-05 NOTE — Chronic Care Management (AMB) (Signed)
  Chronic Care Management   Follow Up Note   07/05/2019 Name: Philip Moreno MRN: [id number] DOB: 02-Apr-1941  Referred by: Olin Hauser, DO Reason for referral : Chronic Care Management (Initial Patient Outreach)   SHANTI BRIAR is a 78 y.o. year old male who is a primary care patient of Olin Hauser, DO. The CCM team was consulted for assistance with chronic disease management and care coordination needs.    Receive referral from [place] requesting outreach to patient's wife/caregiver, Errin Stokes, for medication assistance with cost of Eliquis.  Was unable to reach patient's wife via telephone today and have left HIPAA compliant voicemail asking patient to return my call.   Plan  The care management team will reach out to the patient again over the next 14 days.   Harlow Asa, PharmD, [place] [employer] [phone]

## 2019-07-05 NOTE — Patient Instructions (Signed)
Thank you allowing the Chronic Care Management Team to be a part of your care! It was a pleasure speaking with you today!     CCM (Chronic Care Management) Team    Janci Minor RN, BSN Nurse Care Coordinator  [phone]   Harlow Asa PharmD  Clinical Pharmacist  [phone]   Eula Fried LCSW Clinical Social Worker [phone]  Visit Information  Goals Addressed            This Visit's Progress   . Medication Assistance       Current Barriers:  . Financial Barriers: patient has [place] 2 insurance and reports copay for Eliquis is cost prohibitive at this time  Pharmacist Clinical Goal(s):  Marland Kitchen Over the next 30 days, patient will work with PharmD and providers to relieve medication access concerns  Interventions: . Interview patient's wife/caregiver regarding financial barrier for Eliquis o Reports patient having a copayment >$100 monthly since beginning of calendar year . Counsel patient on the importance of taking Eliquis as directed. o Reports has recently started to give patient Eliquis just once daily in order to ration medication.  o Advise caregiver on importance of giving Eliquis 5 mg twice daily as directed and she verbalizes understanding o Mrs. Chau reports last picked up a 30 day supply of medication within the last week . Attempt to reach [place] for further information. However, pharmacy phone lines not working; drop call x 2.  o Advise Mrs. Meininger that I will follow up again tomorrow afternoon.   Patient Self Care Activities:  . Patient attends medical appointments as scheduled . Patient calls provider office for any new medication questions/concerns  Initial goal documentation        The patient verbalized understanding of instructions provided today and declined a print copy of patient instruction materials.   Telephone follow up appointment with care management team member scheduled  for: 12/3  Harlow Asa, PharmD, [place] [phone]

## 2019-07-06 ENCOUNTER — Ambulatory Visit: Payer: Self-pay | Admitting: Pharmacist

## 2019-07-06 ENCOUNTER — Telehealth: Payer: Self-pay

## 2019-07-06 DIAGNOSIS — I4819 Other persistent atrial fibrillation: Secondary | ICD-10-CM

## 2019-07-06 NOTE — Chronic Care Management (AMB) (Signed)
Chronic Care Management   Follow Up Note   07/06/2019 Name: Philip Moreno MRN: [id number] DOB: [birth year]  Referred by: Olin Hauser, DO Reason for referral : Care Coordination (Pharmacy Phone Call) and Chronic Care Management (Caregiver Phone Call)   Philip Moreno is a 78 y.o. year old male who is a primary care patient of Olin Hauser, DO. The CCM team was consulted for assistance with chronic disease management and care coordination needs.    Coordination of care call to [place]  Was unable to reach patient or wife via telephone today and have left HIPAA compliant voicemail asking patient to return my call.  Review of patient status, including review of consultants reports, relevant laboratory and other test results, and collaboration with appropriate care team members and the patient's provider was performed as part of comprehensive patient evaluation and provision of chronic care management services.     Outpatient Encounter Medications as of 07/06/2019  Medication Sig Note  . allopurinol (ZYLOPRIM) 100 MG tablet Take 1 tablet by mouth once daily   . AMITIZA 24 MCG capsule TAKE 1 CAPSULE BY MOUTH ONCE DAILY AS NEEDED FOR CONSTIPATION   . apixaban (ELIQUIS) 5 MG TABS tablet Take 1 tablet (5 mg total) by mouth 2 (two) times daily.   . clotrimazole-betamethasone (LOTRISONE) cream Apply topically 2 (two) times daily. For up to 1-2 weeks, may re-use daily up to 1 week as needed.   . colchicine 0.6 MG tablet Take only as needed for acute gout flare within 24 hours - take 2 tablets, then 1 hour later take 1 more tablet, then switch to 1 tab daily until resolve   . EUTHYROX 50 MCG tablet TAKE 1 TABLET BY MOUTH ONCE DAILY BEFORE BREAKFAST 07/04/2019: levothyroxine  . levETIRAcetam (KEPPRA) 1000 MG tablet Take 1 tablet by mouth twice daily   . lisinopril (ZESTRIL) 30 MG tablet Take 1 tablet by mouth once daily   . metoprolol succinate (TOPROL-XL) 100 MG 24 hr  tablet TAKE 1 TABLET BY MOUTH ONCE DAILY. TAKE WITH OR IMMEDIATELY FOLLOWING A MEAL.   . mirtazapine (REMERON SOL-TAB) 15 MG disintegrating tablet Take 1 tablet (15 mg total) by mouth at bedtime.    No facility-administered encounter medications on file as of 07/06/2019.     Goals Addressed            This Visit's Progress   . Medication Assistance       Current Barriers:  . Financial Barriers: patient has [place] 2 insurance and reports copay for Eliquis is cost prohibitive at this time  Pharmacist Clinical Goal(s):  Marland Kitchen Over the next 30 days, patient will work with PharmD and providers to relieve medication access concerns  Interventions: . Coordination of care call with [place] R[facility], [place] Gu[place]. [facility] reports patient's Eliquis copayments in 2020 were $47/month until July. Starting in August, payment increased to >$100/month o Confirm per [employer], Eliquis is a tier 3 medication through patient's plan formulary and tier 3 copayment is $47 for 30 day supply from standard network pharmacies . Unable to reach patient's wife/caregiver today to follow up.  Patient Self Care Activities:  . Patient attends medical appointments as scheduled . Patient calls provider office for any new medication questions/concerns  Please see past updates related to this goal by clicking on the "Past Updates" button in the selected goal         Plan  The care management team  will reach out to the patient again over the next 30 days.   Harlow Asa, PharmD, [place] [employer] [phone]

## 2019-07-13 ENCOUNTER — Telehealth: Payer: Self-pay

## 2019-07-14 ENCOUNTER — Emergency Department
Admission: EM | Admit: 2019-07-14 | Discharge: 2019-07-14 | Disposition: A | Discharge status: Home / Self Care | Payer: Medicare Other | Attending: Emergency Medicine | Admitting: Emergency Medicine

## 2019-07-14 ENCOUNTER — Other Ambulatory Visit: Payer: Self-pay

## 2019-07-14 DIAGNOSIS — F0390 Unspecified dementia without behavioral disturbance: Secondary | ICD-10-CM | POA: Diagnosis not present

## 2019-07-14 DIAGNOSIS — R040 Epistaxis: Secondary | ICD-10-CM | POA: Insufficient documentation

## 2019-07-14 DIAGNOSIS — Z79899 Other long term (current) drug therapy: Secondary | ICD-10-CM | POA: Diagnosis not present

## 2019-07-14 DIAGNOSIS — F17220 Nicotine dependence, chewing tobacco, uncomplicated: Secondary | ICD-10-CM | POA: Diagnosis not present

## 2019-07-14 DIAGNOSIS — I129 Hypertensive chronic kidney disease with stage 1 through stage 4 chronic kidney disease, or unspecified chronic kidney disease: Secondary | ICD-10-CM | POA: Diagnosis not present

## 2019-07-14 DIAGNOSIS — Z8673 Personal history of transient ischemic attack (TIA), and cerebral infarction without residual deficits: Secondary | ICD-10-CM | POA: Diagnosis not present

## 2019-07-14 DIAGNOSIS — Z7901 Long term (current) use of anticoagulants: Secondary | ICD-10-CM | POA: Diagnosis not present

## 2019-07-14 DIAGNOSIS — N183 Chronic kidney disease, stage 3 unspecified: Secondary | ICD-10-CM | POA: Diagnosis not present

## 2019-07-14 LAB — CBC
HCT: 52.7 % — ABNORMAL HIGH (ref 39.0–52.0)
Hemoglobin: 17.7 g/dL — ABNORMAL HIGH (ref 13.0–17.0)
MCH: 27.6 pg (ref 26.0–34.0)
MCHC: 33.6 g/dL (ref 30.0–36.0)
MCV: 82.1 fL (ref 80.0–100.0)
Platelets: 204 10*3/uL (ref 150–400)
RBC: 6.42 MIL/uL — ABNORMAL HIGH (ref 4.22–5.81)
RDW: 13.5 % (ref 11.5–15.5)
WBC: 7.4 10*3/uL (ref 4.0–10.5)
nRBC: 0 % (ref 0.0–0.2)

## 2019-07-14 LAB — PROTIME-INR
INR: 1.1 (ref 0.8–1.2)
Prothrombin Time: 14.1 seconds (ref 11.4–15.2)

## 2019-07-14 MED ORDER — OXYMETAZOLINE HCL 0.05 % NA SOLN
1.0000 | Freq: Once | NASAL | Status: AC
  Administered 2019-07-14: 16:00:00 1 via NASAL
  Filled 2019-07-14: qty 30

## 2019-07-14 MED ORDER — TRANEXAMIC ACID 1000 MG/10ML IV SOLN
500.0000 mg | Freq: Once | INTRAVENOUS | Status: AC
  Administered 2019-07-14: 16:00:00 500 mg via TOPICAL
  Filled 2019-07-14 (×2): qty 10

## 2019-07-14 NOTE — ED Notes (Signed)
Pt ambulated to toilet with steady gait, nose clamp still in place

## 2019-07-14 NOTE — ED Notes (Signed)
The right nare appears to have stopped oozing at this time, cont to monitor

## 2019-07-14 NOTE — Discharge Instructions (Signed)
You can use 1 spray of the medicine into each nostril and put the nose clamp on.  If it does not stop bleeding then return to the ER.  Otherwise he can follow-up with ENT.  He can also if his during the week follow-up with them if the bleeding restarts.  Turn to the ER for any other concerns.

## 2019-07-14 NOTE — ED Provider Notes (Signed)
[facility] Emergency Department Provider Note  ____________________________________________   First MD Initiated Contact with Patient 07/14/19 1521     (approximate)  I have reviewed the triage vital signs and the nursing notes.   HISTORY  Chief Complaint Epistaxis    HPI Philip Moreno is a 78 y.o. 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  Patient states he is had this happen previously but he is unsure what they did.  He thinks he did put some nasal packing in it was very uncomfortable.  Patient states the bleeding started this morning, severe, constant, nothing made it better, nothing made it worse.  He says it was a moderate amount.  He currently has a nasal clamp in place without any active bleeding at this time.  Denies any other chest pain, shortness of breath, fevers, cough          Past Medical History:  Diagnosis Date  . Arthritis   . Cancer [facility]) 05/2011   prostate cancer, [facility]  . DVT (deep venous thrombosis) ([place])   . GERD (gastroesophageal reflux disease)   . Memory loss   . Pulmonary embolism ([place])   . TIA (transient ischemic attack)     Patient Active Problem List   Diagnosis Date Noted  . Pre-diabetes 02/14/2018  . Atrial fibrillation, persistent ([place]) 02/11/2018  . Benign hypertension with CKD (chronic kidney disease) stage III 11/06/2017  . Hypothyroidism (acquired) 11/06/2017  . Palliative care patient 11/05/2017  . History of pulmonary embolus (PE) 06/13/2017  . Chronic gout 12/23/2015  . History of TIA (transient ischemic attack)   . Vascular dementia with behavioral disturbance ([place])   . Seizure ([place]) 12/20/2015  . History of embolic stroke [id number]  . CKD (chronic kidney disease) stage 3, GFR 30-59 ml/min 12/20/2015  . Arthritis 04/24/2014    Past Surgical History:  Procedure Laterality Date  . COLONOSCOPY WITH PROPOFOL N/A 02/27/2015   Procedure: COLONOSCOPY WITH PROPOFOL;  Surgeon:  Christene Lye, MD;  Location: [facility] ENDOSCOPY;  Service: Endoscopy;  Laterality: N/A;  . LYMPHADENECTOMY  08/25/2012   Procedure: LYMPHADENECTOMY;  Surgeon: Dutch Gray, MD;  Location: [facility] ORS;  Service: Urology;  Laterality: Bilateral;  . PROSTATE SURGERY  2012  . ROBOT ASSISTED LAPAROSCOPIC RADICAL PROSTATECTOMY  08/25/2012   Procedure: ROBOTIC ASSISTED LAPAROSCOPIC RADICAL PROSTATECTOMY LEVEL 2;  Surgeon: Dutch Gray, MD;  Location: [facility] ORS;  Service: Urology;  Laterality: N/A;    Prior to Admission medications   Medication Sig Start Date End Date Taking? Authorizing Provider  allopurinol (ZYLOPRIM) 100 MG tablet Take 1 tablet by mouth once daily 04/17/19   Karamalegos, Alexander J, DO  AMITIZA 24 MCG capsule TAKE 1 CAPSULE BY MOUTH ONCE DAILY AS NEEDED FOR CONSTIPATION 05/09/18   [provider]  apixaban (ELIQUIS) 5 MG TABS tablet Take 1 tablet (5 mg total) by mouth 2 (two) times daily. 04/24/19   Karamalegos, Devonne Doughty, DO  clotrimazole-betamethasone (LOTRISONE) cream Apply topically 2 (two) times daily. For up to 1-2 weeks, may re-use daily up to 1 week as needed. 05/18/19   Karamalegos, Devonne Doughty, DO  colchicine 0.6 MG tablet Take only as needed for acute gout flare within 24 hours - take 2 tablets, then 1 hour later take 1 more tablet, then switch to 1 tab daily until resolve 08/12/18   Parks Ranger, Alexander J, DO  EUTHYROX 50 MCG tablet TAKE 1 TABLET BY MOUTH ONCE DAILY BEFORE BREAKFAST 02/23/19  Parks Ranger, Devonne Doughty, DO  levETIRAcetam (KEPPRA) 1000 MG tablet Take 1 tablet by mouth twice daily 06/22/19   Parks Ranger, Devonne Doughty, DO  lisinopril (ZESTRIL) 30 MG tablet Take 1 tablet by mouth once daily 02/09/19   Parks Ranger, Devonne Doughty, DO  metoprolol succinate (TOPROL-XL) 100 MG 24 hr tablet TAKE 1 TABLET BY MOUTH ONCE DAILY. TAKE WITH OR IMMEDIATELY FOLLOWING A MEAL. 04/17/19   Karamalegos, Devonne Doughty, DO  mirtazapine (REMERON SOL-TAB) 15 MG disintegrating tablet Take 1  tablet (15 mg total) by mouth at bedtime. 09/07/18 09/07/19  Olin Hauser, DO    Allergies Patient has no known allergies.  Family History  Problem Relation Age of Onset  . Hypertension Mother   . Heart attack Father   . Cancer Brother   . Heart failure Brother   . Diabetes Brother     Social History Social History   Tobacco Use  . Smoking status: Never Smoker  . Smokeless tobacco: Current User    Types: Chew  Substance Use Topics  . Alcohol use: No    Alcohol/week: 0.0 standard drinks  . Drug use: No      Review of Systems Constitutional: No fever/chills Eyes: No visual changes. ENT: No sore throat.  Nasal bleeding. Cardiovascular: Denies chest pain. Respiratory: Denies shortness of breath. Gastrointestinal: No abdominal pain.  No nausea, no vomiting.  No diarrhea.  No constipation. Genitourinary: Negative for dysuria. Musculoskeletal: Negative for back pain. Skin: Negative for rash. Neurological: Negative for headaches, focal weakness or numbness. All other ROS negative ____________________________________________   PHYSICAL EXAM:  VITAL SIGNS: ED Triage Vitals  Enc Vitals Group     BP 07/14/19 1332 (!) 160/109     Pulse Rate 07/14/19 1332 76     Resp 07/14/19 1332 18     Temp 07/14/19 1332 (!) 97.5 F (36.4 C)     Temp src --      SpO2 07/14/19 1332 96 %     Weight 07/14/19 1328 185 lb 3 oz (84 kg)     Height 07/14/19 1328 6\' 3"  (1.905 m)     Head Circumference --      Peak Flow --      Pain Score 07/14/19 1327 0     Pain Loc --      Pain Edu? --      Excl. in [place]? --     Constitutional: Alert and oriented. Well appearing and in no acute distress. Eyes: Conjunctivae are normal. EOMI. Head: Atraumatic. Nose: No congestion/rhinnorhea.  Blood clots in the right nostril.  May be a small ooze but no severe bleeding.  Left nostril appears clear. Mouth/Throat: Mucous membranes are moist.   Neck: No stridor. Trachea Midline.  FROM Cardiovascular: Normal rate, regular rhythm. Grossly normal heart sounds.  Good peripheral circulation. Respiratory: Normal respiratory effort.  No retractions. Lungs CTAB. Gastrointestinal: Soft and nontender. No distention. No abdominal bruits.  Musculoskeletal: No lower extremity tenderness nor edema.  No joint effusions. Neurologic:  Normal speech and language. No gross focal neurologic deficits are appreciated.  Skin:  Skin is warm, dry and intact. No rash noted. Psychiatric: Mood and affect are normal. Speech and behavior are normal. GU: Deferred   ____________________________________________   LABS (all labs ordered are listed, but only abnormal results are displayed)  Labs Reviewed  CBC - Abnormal; Notable for the following components:      Result Value   RBC 6.42 (*)    Hemoglobin 17.7 (*)  HCT 52.7 (*)    All other components within normal limits  PROTIME-INR   ____________________________________________  PROCEDURES  Procedure(s) performed (including Critical Care):  .Epistaxis Management  Date/Time: 07/14/2019 6:12 PM Performed by: Vanessa [place], MD Authorized by: Vanessa Paia, MD   Consent:    Consent obtained:  Emergent situation Procedure details:    Treatment site:  R anterior   Repair method: oxymetalozine, TXA soaked gauze    Treatment complexity:  Limited Post-procedure details:    Assessment:  Bleeding stopped   Patient tolerance of procedure:  Tolerated well, no immediate complications     ____________________________________________   INITIAL IMPRESSION / ASSESSMENT AND PLAN / ED COURSE  Philip Moreno was evaluated in Emergency Department on 07/14/2019 for the symptoms described in the history of present illness. He was evaluated in the context of the global COVID-19 pandemic, which necessitated consideration that the patient might be at risk for infection with the SARS-CoV-2 virus that causes COVID-19. Institutional protocols and  algorithms that pertain to the evaluation of patients at risk for COVID-19 are in a state of rapid change based on information released by regulatory bodies including the CDC and federal and state organizations. These policies and algorithms were followed during the patient's care in the ED.    Exam is consistent with a nosebleed.  Most likely anterior in nature.  Peers to be on the right side.  We will remove the nasal clip and give a vasoconstrictor and do some TXA soaked gauze if there is still bleeding.  We will get labs to evaluate for anemia, coagulopathy.  Patient does not appear hypotensive like he is in shock.  Small slight ooze from right nostril.  Sprayed some vasoconstrictor as well as placing TXA soaked gauze in there and will monitor him to make sure that he does not restart bleeding.  No active bleeding noted since 5PM.   6:11 PM evaluated patient he continues to not have a nosebleed.  He feels comfortable with being discharged home at this time will follow-up with ENT.  I discussed the provisional nature of ED diagnosis, the treatment so far, the ongoing plan of care, follow up appointments and return precautions with the patient and any family or support people present. They expressed understanding and agreed with the plan, discharged home.    ____________________________________________   FINAL CLINICAL IMPRESSION(S) / ED DIAGNOSES   Final diagnoses:  Epistaxis      MEDICATIONS GIVEN DURING THIS VISIT:  Medications  oxymetazoline (AFRIN) 0.05 % nasal spray 1 spray (has no administration in time range)  tranexamic acid (CYKLOKAPRON) injection 500 mg (has no administration in time range)     ED Discharge Orders    None       Note:  This document was prepared using Dragon voice recognition software and may include unintentional dictation errors.   Vanessa [place], MD 07/14/19 [phone]

## 2019-07-14 NOTE — ED Triage Notes (Signed)
Pt to ED via POV for chief complaint of nose bleed. Pt nose clamped at this time, bleeding controlled.  Pt oriented to person.  Pt supposedly takes eliquis.  Pt in NAD at this time

## 2019-07-15 ENCOUNTER — Other Ambulatory Visit: Payer: Self-pay | Admitting: Family Medicine

## 2019-07-15 DIAGNOSIS — M1A0710 Idiopathic chronic gout, right ankle and foot, without tophus (tophi): Secondary | ICD-10-CM

## 2019-07-17 ENCOUNTER — Encounter: Payer: Self-pay | Admitting: Emergency Medicine

## 2019-07-17 ENCOUNTER — Other Ambulatory Visit: Payer: Self-pay

## 2019-07-17 ENCOUNTER — Emergency Department
Admission: EM | Admit: 2019-07-17 | Discharge: 2019-07-17 | Disposition: A | Discharge status: Home / Self Care | Payer: Medicare Other | Attending: Emergency Medicine | Admitting: Emergency Medicine

## 2019-07-17 DIAGNOSIS — R58 Hemorrhage, not elsewhere classified: Secondary | ICD-10-CM | POA: Diagnosis not present

## 2019-07-17 DIAGNOSIS — Z79899 Other long term (current) drug therapy: Secondary | ICD-10-CM | POA: Insufficient documentation

## 2019-07-17 DIAGNOSIS — Z86718 Personal history of other venous thrombosis and embolism: Secondary | ICD-10-CM | POA: Diagnosis not present

## 2019-07-17 DIAGNOSIS — I10 Essential (primary) hypertension: Secondary | ICD-10-CM | POA: Diagnosis not present

## 2019-07-17 DIAGNOSIS — Z8546 Personal history of malignant neoplasm of prostate: Secondary | ICD-10-CM | POA: Diagnosis not present

## 2019-07-17 DIAGNOSIS — Z743 Need for continuous supervision: Secondary | ICD-10-CM | POA: Diagnosis not present

## 2019-07-17 DIAGNOSIS — Z7901 Long term (current) use of anticoagulants: Secondary | ICD-10-CM | POA: Insufficient documentation

## 2019-07-17 DIAGNOSIS — E039 Hypothyroidism, unspecified: Secondary | ICD-10-CM | POA: Diagnosis not present

## 2019-07-17 DIAGNOSIS — R040 Epistaxis: Secondary | ICD-10-CM | POA: Insufficient documentation

## 2019-07-17 DIAGNOSIS — Z86711 Personal history of pulmonary embolism: Secondary | ICD-10-CM | POA: Diagnosis not present

## 2019-07-17 DIAGNOSIS — Z8673 Personal history of transient ischemic attack (TIA), and cerebral infarction without residual deficits: Secondary | ICD-10-CM | POA: Insufficient documentation

## 2019-07-17 DIAGNOSIS — N183 Chronic kidney disease, stage 3 unspecified: Secondary | ICD-10-CM | POA: Insufficient documentation

## 2019-07-17 DIAGNOSIS — R0902 Hypoxemia: Secondary | ICD-10-CM | POA: Diagnosis not present

## 2019-07-17 LAB — CBC
HCT: 46.8 % (ref 39.0–52.0)
Hemoglobin: 15.7 g/dL (ref 13.0–17.0)
MCH: 27.5 pg (ref 26.0–34.0)
MCHC: 33.5 g/dL (ref 30.0–36.0)
MCV: 82.1 fL (ref 80.0–100.0)
Platelets: 211 10*3/uL (ref 150–400)
RBC: 5.7 MIL/uL (ref 4.22–5.81)
RDW: 13.8 % (ref 11.5–15.5)
WBC: 8.6 10*3/uL (ref 4.0–10.5)
nRBC: 0 % (ref 0.0–0.2)

## 2019-07-17 MED ORDER — TRANEXAMIC ACID 1000 MG/10ML IV SOLN
500.0000 mg | Freq: Once | INTRAVENOUS | Status: AC
  Administered 2019-07-17: 500 mg via TOPICAL
  Filled 2019-07-17: qty 10

## 2019-07-17 MED ORDER — OXYMETAZOLINE HCL 0.05 % NA SOLN
1.0000 | Freq: Once | NASAL | Status: AC
  Administered 2019-07-17: 1 via NASAL
  Filled 2019-07-17: qty 30

## 2019-07-17 NOTE — ED Provider Notes (Signed)
[facility] Emergency Department Provider Note ____________________________________________  Time seen: 1806  I have reviewed the triage vital signs and the nursing notes.  HISTORY  Chief Complaint  Epistaxis  HPI Philip Moreno is a 78 y.o. male presents to the ED with a persistent nosebleed.  Patient was seen here 3 days prior for the same in the ED.  He was discharged with nasal spray and had completely aborted nosebleed at the time.  He presents today after noting persistent bleeding today.  Patient denies any preseating trauma or nose manipulation.  He denies any chest pain, shortness of breath, or vomiting.   Past Medical History:  Diagnosis Date  . Arthritis   . Cancer [facility]) 05/2011   prostate cancer, [facility]  . DVT (deep venous thrombosis) ([place])   . GERD (gastroesophageal reflux disease)   . Memory loss   . Pulmonary embolism ([place])   . TIA (transient ischemic attack)     Patient Active Problem List   Diagnosis Date Noted  . Pre-diabetes 02/14/2018  . Atrial fibrillation, persistent ([place]) 02/11/2018  . Benign hypertension with CKD (chronic kidney disease) stage III 11/06/2017  . Hypothyroidism (acquired) 11/06/2017  . Palliative care patient 11/05/2017  . History of pulmonary embolus (PE) 06/13/2017  . Chronic gout 12/23/2015  . History of TIA (transient ischemic attack)   . Vascular dementia with behavioral disturbance ([place])   . Seizure ([place]) 12/20/2015  . History of embolic stroke [id number]  . CKD (chronic kidney disease) stage 3, GFR 30-59 ml/min 12/20/2015  . Arthritis 04/24/2014    Past Surgical History:  Procedure Laterality Date  . COLONOSCOPY WITH PROPOFOL N/A 02/27/2015   Procedure: COLONOSCOPY WITH PROPOFOL;  Surgeon: Christene Lye, MD;  Location: [facility] ENDOSCOPY;  Service: Endoscopy;  Laterality: N/A;  . LYMPHADENECTOMY  08/25/2012   Procedure: LYMPHADENECTOMY;  Surgeon: Dutch Gray, MD;  Location: [facility] ORS;  Service:  Urology;  Laterality: Bilateral;  . PROSTATE SURGERY  2012  . ROBOT ASSISTED LAPAROSCOPIC RADICAL PROSTATECTOMY  08/25/2012   Procedure: ROBOTIC ASSISTED LAPAROSCOPIC RADICAL PROSTATECTOMY LEVEL 2;  Surgeon: Dutch Gray, MD;  Location: [facility] ORS;  Service: Urology;  Laterality: N/A;    Prior to Admission medications   Medication Sig Start Date End Date Taking? Authorizing Provider  allopurinol (ZYLOPRIM) 100 MG tablet Take 1 tablet by mouth once daily 07/16/19   Karamalegos, Alexander J, DO  AMITIZA 24 MCG capsule TAKE 1 CAPSULE BY MOUTH ONCE DAILY AS NEEDED FOR CONSTIPATION 05/09/18   [provider]  apixaban (ELIQUIS) 5 MG TABS tablet Take 1 tablet (5 mg total) by mouth 2 (two) times daily. 04/24/19   Karamalegos, Devonne Doughty, DO  clotrimazole-betamethasone (LOTRISONE) cream Apply topically 2 (two) times daily. For up to 1-2 weeks, may re-use daily up to 1 week as needed. 05/18/19   Karamalegos, Devonne Doughty, DO  colchicine 0.6 MG tablet Take only as needed for acute gout flare within 24 hours - take 2 tablets, then 1 hour later take 1 more tablet, then switch to 1 tab daily until resolve 08/12/18   Olin Hauser, DO  EUTHYROX 50 MCG tablet TAKE 1 TABLET BY MOUTH ONCE DAILY BEFORE BREAKFAST 02/23/19   Parks Ranger, Devonne Doughty, DO  levETIRAcetam (KEPPRA) 1000 MG tablet Take 1 tablet by mouth twice daily 06/22/19   Olin Hauser, DO  lisinopril (ZESTRIL) 30 MG tablet Take 1 tablet by mouth once daily 02/09/19   Olin Hauser, DO  metoprolol succinate (TOPROL-XL) 100 MG  24 hr tablet TAKE 1 TABLET BY MOUTH ONCE DAILY. TAKE WITH OR IMMEDIATELY FOLLOWING A MEAL. 04/17/19   Karamalegos, Devonne Doughty, DO  mirtazapine (REMERON SOL-TAB) 15 MG disintegrating tablet Take 1 tablet (15 mg total) by mouth at bedtime. 09/07/18 09/07/19  Olin Hauser, DO    Allergies Patient has no known allergies.  Family History  Problem Relation Age of Onset  . Hypertension Mother    . Heart attack Father   . Cancer Brother   . Heart failure Brother   . Diabetes Brother     Social History Social History   Tobacco Use  . Smoking status: Never Smoker  . Smokeless tobacco: Current User    Types: Chew  Substance Use Topics  . Alcohol use: No    Alcohol/week: 0.0 standard drinks  . Drug use: No    Review of Systems  Constitutional: Negative for fever. Eyes: Negative for visual changes. ENT: Negative for sore throat.  Epistaxis as above. Cardiovascular: Negative for chest pain. Respiratory: Negative for shortness of breath. Gastrointestinal: Negative for abdominal pain, vomiting and diarrhea. Genitourinary: Negative for dysuria. Musculoskeletal: Negative for back pain. Skin: Negative for rash. Neurological: Negative for headaches, focal weakness or numbness. ____________________________________________  PHYSICAL EXAM:  VITAL SIGNS: ED Triage Vitals  Enc Vitals Group     BP 07/17/19 1619 (!) 151/109     Pulse Rate 07/17/19 1619 (!) 53     Resp 07/17/19 1619 20     Temp 07/17/19 1619 97.6 F (36.4 C)     Temp Source 07/17/19 1619 Oral     SpO2 07/17/19 1619 98 %     Weight 07/17/19 1620 189 lb (85.7 kg)     Height 07/17/19 1620 6\' 3"  (1.905 m)     Head Circumference --      Peak Flow --      Pain Score 07/17/19 1620 0     Pain Loc --      Pain Edu? --      Excl. in [place]? --     Constitutional: Alert and oriented. Well appearing and in no distress. Head: Normocephalic and atraumatic. Eyes: Conjunctivae are normal. Normal extraocular movements Nose: No congestion/rhinorrhea. Epistaxis noted to the right  Mouth/Throat: Mucous membranes are moist. Cardiovascular: Normal rate, regular rhythm. Normal distal pulses. Respiratory: Normal respiratory effort. No wheezes/rales/rhonchi. Gastrointestinal: Soft and nontender. No distention. Musculoskeletal: Nontender with normal range of motion in all extremities.  Psychiatric: Mood and affect are normal.  Patient exhibits appropriate insight and judgment. ____________________________________________   LABS (pertinent positives/negatives) Labs Reviewed  CBC  ____________________________________________  PROCEDURES  Oxymetazoline 0.05% spray - ii sprays per nostril Tranexamic acid - topically per nostril at 20:05 Nose clip applied Procedures ____________________________________________  INITIAL IMPRESSION / ASSESSMENT AND PLAN / ED COURSE  Patient to the ED for recurrent nosebleed.  Patient presented to [place] after being seen here for an active right sided nosebleed that was aborted after topical oxymetazoline.  He again today was treated for an active nosebleed, and it was successfully aborted with TXA application topically.  Patient is discharged at this time with no active bleeding to the nose.  He is discharged again with epistaxis management instructions to the care of his caregiver.  He is referred to ENT once again for further evaluation management of subsequent nosebleeds. Return precautions have been reviewed.  TYMON DETZLER was evaluated in Emergency Department on 07/17/2019 for the symptoms described in the history of present illness. He was evaluated  in the context of the global COVID-19 pandemic, which necessitated consideration that the patient might be at risk for infection with the SARS-CoV-2 virus that causes COVID-19. Institutional protocols and algorithms that pertain to the evaluation of patients at risk for COVID-19 are in a state of rapid change based on information released by regulatory bodies including the CDC and federal and state organizations. These policies and algorithms were followed during the patient's care in the ED. ____________________________________________  FINAL CLINICAL IMPRESSION(S) / ED DIAGNOSES  Final diagnoses:  Epistaxis  Right-sided epistaxis      Carmie End, Dannielle Karvonen, PA-C 07/17/19 2300    Blake Divine, MD 07/22/19 [phone]

## 2019-07-17 NOTE — ED Triage Notes (Signed)
Pt wife reports They were here Friday for a nosebleed and it stopped but then started back today at 2. Pt takes eloquis.

## 2019-07-17 NOTE — ED Notes (Signed)

## 2019-07-17 NOTE — ED Notes (Signed)
Pharmacy notified to send TXA dose.

## 2019-07-17 NOTE — ED Notes (Signed)
See triage note  Presents with nose bleed  No bleeding noted on arrival to treatment room

## 2019-07-17 NOTE — ED Triage Notes (Signed)
Pt comes into the ED via EMS from home with nosebleed. Was seen here for the same on Friday.

## 2019-07-17 NOTE — Discharge Instructions (Addendum)
Use the Afrin 2-3 times a day. Use the nose clip and cotton soaked in Afrin for any active bleeding. Follow-up with ENT as discussed. Return to the ED as needed.

## 2019-07-20 ENCOUNTER — Emergency Department: Payer: Medicare Other

## 2019-07-20 ENCOUNTER — Encounter: Payer: Self-pay | Admitting: *Deleted

## 2019-07-20 ENCOUNTER — Other Ambulatory Visit: Payer: Self-pay

## 2019-07-20 DIAGNOSIS — Z79899 Other long term (current) drug therapy: Secondary | ICD-10-CM | POA: Diagnosis not present

## 2019-07-20 DIAGNOSIS — Z801 Family history of malignant neoplasm of trachea, bronchus and lung: Secondary | ICD-10-CM | POA: Diagnosis not present

## 2019-07-20 DIAGNOSIS — R569 Unspecified convulsions: Secondary | ICD-10-CM | POA: Diagnosis not present

## 2019-07-20 DIAGNOSIS — N183 Chronic kidney disease, stage 3 unspecified: Secondary | ICD-10-CM | POA: Diagnosis not present

## 2019-07-20 DIAGNOSIS — I129 Hypertensive chronic kidney disease with stage 1 through stage 4 chronic kidney disease, or unspecified chronic kidney disease: Secondary | ICD-10-CM | POA: Diagnosis not present

## 2019-07-20 DIAGNOSIS — Z8249 Family history of ischemic heart disease and other diseases of the circulatory system: Secondary | ICD-10-CM | POA: Diagnosis not present

## 2019-07-20 DIAGNOSIS — I444 Left anterior fascicular block: Secondary | ICD-10-CM | POA: Diagnosis not present

## 2019-07-20 DIAGNOSIS — M109 Gout, unspecified: Secondary | ICD-10-CM | POA: Diagnosis not present

## 2019-07-20 DIAGNOSIS — Z7901 Long term (current) use of anticoagulants: Secondary | ICD-10-CM | POA: Diagnosis not present

## 2019-07-20 DIAGNOSIS — Z5181 Encounter for therapeutic drug level monitoring: Secondary | ICD-10-CM | POA: Diagnosis not present

## 2019-07-20 DIAGNOSIS — Z833 Family history of diabetes mellitus: Secondary | ICD-10-CM | POA: Diagnosis not present

## 2019-07-20 DIAGNOSIS — R001 Bradycardia, unspecified: Secondary | ICD-10-CM | POA: Diagnosis not present

## 2019-07-20 DIAGNOSIS — E876 Hypokalemia: Secondary | ICD-10-CM | POA: Diagnosis not present

## 2019-07-20 DIAGNOSIS — D72829 Elevated white blood cell count, unspecified: Secondary | ICD-10-CM | POA: Diagnosis not present

## 2019-07-20 DIAGNOSIS — E039 Hypothyroidism, unspecified: Secondary | ICD-10-CM | POA: Diagnosis not present

## 2019-07-20 DIAGNOSIS — Z86718 Personal history of other venous thrombosis and embolism: Secondary | ICD-10-CM | POA: Diagnosis not present

## 2019-07-20 DIAGNOSIS — I10 Essential (primary) hypertension: Secondary | ICD-10-CM | POA: Diagnosis not present

## 2019-07-20 DIAGNOSIS — R040 Epistaxis: Secondary | ICD-10-CM | POA: Diagnosis not present

## 2019-07-20 DIAGNOSIS — G309 Alzheimer's disease, unspecified: Secondary | ICD-10-CM | POA: Diagnosis not present

## 2019-07-20 DIAGNOSIS — K219 Gastro-esophageal reflux disease without esophagitis: Secondary | ICD-10-CM | POA: Diagnosis not present

## 2019-07-20 DIAGNOSIS — R0602 Shortness of breath: Secondary | ICD-10-CM | POA: Diagnosis not present

## 2019-07-20 DIAGNOSIS — R410 Disorientation, unspecified: Secondary | ICD-10-CM | POA: Diagnosis not present

## 2019-07-20 DIAGNOSIS — Z743 Need for continuous supervision: Secondary | ICD-10-CM | POA: Diagnosis not present

## 2019-07-20 DIAGNOSIS — R9431 Abnormal electrocardiogram [ECG] [EKG]: Secondary | ICD-10-CM | POA: Diagnosis not present

## 2019-07-20 DIAGNOSIS — Z8673 Personal history of transient ischemic attack (TIA), and cerebral infarction without residual deficits: Secondary | ICD-10-CM | POA: Diagnosis not present

## 2019-07-20 DIAGNOSIS — Z86711 Personal history of pulmonary embolism: Secondary | ICD-10-CM | POA: Diagnosis not present

## 2019-07-20 DIAGNOSIS — I4891 Unspecified atrial fibrillation: Secondary | ICD-10-CM | POA: Diagnosis not present

## 2019-07-20 DIAGNOSIS — D62 Acute posthemorrhagic anemia: Secondary | ICD-10-CM | POA: Diagnosis not present

## 2019-07-20 DIAGNOSIS — M1990 Unspecified osteoarthritis, unspecified site: Secondary | ICD-10-CM | POA: Diagnosis not present

## 2019-07-20 DIAGNOSIS — N189 Chronic kidney disease, unspecified: Secondary | ICD-10-CM | POA: Diagnosis not present

## 2019-07-20 LAB — DIFFERENTIAL
Abs Immature Granulocytes: 0.03 10*3/uL (ref 0.00–0.07)
Basophils Absolute: 0 10*3/uL (ref 0.0–0.1)
Basophils Relative: 0 %
Eosinophils Absolute: 0.2 10*3/uL (ref 0.0–0.5)
Eosinophils Relative: 2 %
Immature Granulocytes: 0 %
Lymphocytes Relative: 31 %
Lymphs Abs: 2.4 10*3/uL (ref 0.7–4.0)
Monocytes Absolute: 0.7 10*3/uL (ref 0.1–1.0)
Monocytes Relative: 9 %
Neutro Abs: 4.5 10*3/uL (ref 1.7–7.7)
Neutrophils Relative %: 58 %

## 2019-07-20 LAB — CBC
HCT: 46.9 % (ref 39.0–52.0)
Hemoglobin: 15.1 g/dL (ref 13.0–17.0)
MCH: 27.8 pg (ref 26.0–34.0)
MCHC: 32.2 g/dL (ref 30.0–36.0)
MCV: 86.4 fL (ref 80.0–100.0)
Platelets: 214 10*3/uL (ref 150–400)
RBC: 5.43 MIL/uL (ref 4.22–5.81)
RDW: 13.8 % (ref 11.5–15.5)
WBC: 7.8 10*3/uL (ref 4.0–10.5)
nRBC: 0 % (ref 0.0–0.2)

## 2019-07-20 LAB — COMPREHENSIVE METABOLIC PANEL
ALT: 50 U/L — ABNORMAL HIGH (ref 0–44)
AST: 42 U/L — ABNORMAL HIGH (ref 15–41)
Albumin: 4.1 g/dL (ref 3.5–5.0)
Alkaline Phosphatase: 47 U/L (ref 38–126)
Anion gap: 11 (ref 5–15)
BUN: 16 mg/dL (ref 8–23)
CO2: 24 mmol/L (ref 22–32)
Calcium: 9 mg/dL (ref 8.9–10.3)
Chloride: 107 mmol/L (ref 98–111)
Creatinine, Ser: 1.33 mg/dL — ABNORMAL HIGH (ref 0.61–1.24)
GFR calc Af Amer: 60 mL/min — ABNORMAL LOW (ref >60)
GFR calc non Af Amer: 52 mL/min — ABNORMAL LOW (ref >60)
Glucose, Bld: 96 mg/dL (ref 70–99)
Potassium: 3.7 mmol/L (ref 3.5–5.1)
Sodium: 142 mmol/L (ref 135–145)
Total Bilirubin: 0.7 mg/dL (ref 0.3–1.2)
Total Protein: 8.3 g/dL — ABNORMAL HIGH (ref 6.5–8.1)

## 2019-07-20 LAB — APTT: aPTT: 33 seconds (ref 24–36)

## 2019-07-20 LAB — PROTIME-INR
INR: 1.1 (ref 0.8–1.2)
Prothrombin Time: 14.5 seconds (ref 11.4–15.2)

## 2019-07-20 NOTE — ED Triage Notes (Signed)
Pt to triage via wheelchair.  Pt reports high blood pressure tonight. No headache.  Wife reports confusion earlier tonight.  No dizziness.  No slurred speech.  Pt alert

## 2019-07-20 NOTE — ED Notes (Signed)
No code stroke at this time per dr Royden Purl.  Pt alert  Wife in with pt during triage.

## 2019-07-21 ENCOUNTER — Other Ambulatory Visit: Payer: Self-pay

## 2019-07-21 ENCOUNTER — Telehealth: Payer: Self-pay | Admitting: Family Medicine

## 2019-07-21 ENCOUNTER — Inpatient Hospital Stay
Admission: EM | Admit: 2019-07-21 | Discharge: 2019-07-23 | DRG: 151 | Disposition: A | Discharge status: Home / Self Care | Payer: Medicare Other | Source: Ambulatory Visit | Attending: Internal Medicine | Admitting: Internal Medicine

## 2019-07-21 ENCOUNTER — Emergency Department
Admission: EM | Admit: 2019-07-21 | Discharge: 2019-07-21 | Disposition: A | Discharge status: Home / Self Care | Payer: Medicare Other | Source: Home / Self Care | Attending: Emergency Medicine | Admitting: Emergency Medicine

## 2019-07-21 ENCOUNTER — Observation Stay: Payer: Medicare Other

## 2019-07-21 ENCOUNTER — Emergency Department: Payer: Medicare Other

## 2019-07-21 ENCOUNTER — Telehealth: Payer: Self-pay

## 2019-07-21 DIAGNOSIS — Z833 Family history of diabetes mellitus: Secondary | ICD-10-CM

## 2019-07-21 DIAGNOSIS — I10 Essential (primary) hypertension: Secondary | ICD-10-CM | POA: Diagnosis present

## 2019-07-21 DIAGNOSIS — R040 Epistaxis: Principal | ICD-10-CM

## 2019-07-21 DIAGNOSIS — M109 Gout, unspecified: Secondary | ICD-10-CM | POA: Diagnosis present

## 2019-07-21 DIAGNOSIS — F17220 Nicotine dependence, chewing tobacco, uncomplicated: Secondary | ICD-10-CM | POA: Diagnosis present

## 2019-07-21 DIAGNOSIS — Z86711 Personal history of pulmonary embolism: Secondary | ICD-10-CM

## 2019-07-21 DIAGNOSIS — Z8249 Family history of ischemic heart disease and other diseases of the circulatory system: Secondary | ICD-10-CM

## 2019-07-21 DIAGNOSIS — F0390 Unspecified dementia without behavioral disturbance: Secondary | ICD-10-CM | POA: Diagnosis present

## 2019-07-21 DIAGNOSIS — E039 Hypothyroidism, unspecified: Secondary | ICD-10-CM | POA: Diagnosis present

## 2019-07-21 DIAGNOSIS — Z8546 Personal history of malignant neoplasm of prostate: Secondary | ICD-10-CM

## 2019-07-21 DIAGNOSIS — R0602 Shortness of breath: Secondary | ICD-10-CM

## 2019-07-21 DIAGNOSIS — Z86718 Personal history of other venous thrombosis and embolism: Secondary | ICD-10-CM

## 2019-07-21 DIAGNOSIS — K219 Gastro-esophageal reflux disease without esophagitis: Secondary | ICD-10-CM | POA: Diagnosis present

## 2019-07-21 DIAGNOSIS — Z8673 Personal history of transient ischemic attack (TIA), and cerebral infarction without residual deficits: Secondary | ICD-10-CM

## 2019-07-21 DIAGNOSIS — D62 Acute posthemorrhagic anemia: Secondary | ICD-10-CM | POA: Diagnosis present

## 2019-07-21 DIAGNOSIS — Z79899 Other long term (current) drug therapy: Secondary | ICD-10-CM

## 2019-07-21 DIAGNOSIS — Z7901 Long term (current) use of anticoagulants: Secondary | ICD-10-CM

## 2019-07-21 DIAGNOSIS — R569 Unspecified convulsions: Secondary | ICD-10-CM | POA: Diagnosis present

## 2019-07-21 LAB — CBC WITH DIFFERENTIAL/PLATELET
Abs Immature Granulocytes: 0.02 10*3/uL (ref 0.00–0.07)
Basophils Absolute: 0 10*3/uL (ref 0.0–0.1)
Basophils Relative: 0 %
Eosinophils Absolute: 0.1 10*3/uL (ref 0.0–0.5)
Eosinophils Relative: 1 %
HCT: 43.3 % (ref 39.0–52.0)
Hemoglobin: 14.1 g/dL (ref 13.0–17.0)
Immature Granulocytes: 0 %
Lymphocytes Relative: 19 %
Lymphs Abs: 1.7 10*3/uL (ref 0.7–4.0)
MCH: 28 pg (ref 26.0–34.0)
MCHC: 32.6 g/dL (ref 30.0–36.0)
MCV: 86.1 fL (ref 80.0–100.0)
Monocytes Absolute: 0.8 10*3/uL (ref 0.1–1.0)
Monocytes Relative: 8 %
Neutro Abs: 6.5 10*3/uL (ref 1.7–7.7)
Neutrophils Relative %: 72 %
Platelets: 222 10*3/uL (ref 150–400)
RBC: 5.03 MIL/uL (ref 4.22–5.81)
RDW: 13.9 % (ref 11.5–15.5)
WBC: 9.1 10*3/uL (ref 4.0–10.5)
nRBC: 0 % (ref 0.0–0.2)

## 2019-07-21 LAB — URINALYSIS, COMPLETE (UACMP) WITH MICROSCOPIC
Bacteria, UA: NONE SEEN
Bilirubin Urine: NEGATIVE
Glucose, UA: NEGATIVE mg/dL
Ketones, ur: NEGATIVE mg/dL
Leukocytes,Ua: NEGATIVE
Nitrite: NEGATIVE
Protein, ur: NEGATIVE mg/dL
Specific Gravity, Urine: 1.016 (ref 1.005–1.030)
Squamous Epithelial / HPF: NONE SEEN (ref 0–5)
pH: 6 (ref 5.0–8.0)

## 2019-07-21 MED ORDER — ACETAMINOPHEN 325 MG PO TABS
650.0000 mg | ORAL_TABLET | Freq: Four times a day (QID) | ORAL | Status: DC | PRN
  Administered 2019-07-23: 650 mg via ORAL
  Filled 2019-07-21: qty 2

## 2019-07-21 MED ORDER — ONDANSETRON HCL 4 MG PO TABS
4.0000 mg | ORAL_TABLET | Freq: Four times a day (QID) | ORAL | Status: DC | PRN
  Filled 2019-07-21: qty 1

## 2019-07-21 MED ORDER — OXYMETAZOLINE HCL 0.05 % NA SOLN
1.0000 | Freq: Two times a day (BID) | NASAL | Status: DC | PRN
  Filled 2019-07-21: qty 15

## 2019-07-21 MED ORDER — ACETAMINOPHEN 650 MG RE SUPP: 650.0000 mg | Freq: Four times a day (QID) | RECTAL | Status: DC | PRN

## 2019-07-21 MED ORDER — METOPROLOL SUCCINATE ER 100 MG PO TB24
100.0000 mg | ORAL_TABLET | Freq: Every day | ORAL | Status: DC
  Administered 2019-07-22 – 2019-07-23 (×2): 100 mg via ORAL
  Filled 2019-07-21 (×2): qty 1

## 2019-07-21 MED ORDER — LEVOTHYROXINE SODIUM 50 MCG PO TABS
50.0000 ug | ORAL_TABLET | Freq: Every day | ORAL | Status: DC
  Administered 2019-07-22 – 2019-07-23 (×2): 50 ug via ORAL
  Filled 2019-07-21 (×2): qty 1

## 2019-07-21 MED ORDER — ONDANSETRON HCL 4 MG/2ML IJ SOLN: 4.0000 mg | Freq: Four times a day (QID) | INTRAMUSCULAR | Status: DC | PRN

## 2019-07-21 MED ORDER — LABETALOL HCL 5 MG/ML IV SOLN
INTRAVENOUS | Status: AC
  Administered 2019-07-21: 10 mg via INTRAVENOUS
  Filled 2019-07-21: qty 4

## 2019-07-21 MED ORDER — AMLODIPINE BESYLATE 5 MG PO TABS
5.0000 mg | ORAL_TABLET | Freq: Every day | ORAL | Status: DC
  Administered 2019-07-22 – 2019-07-23 (×2): 5 mg via ORAL
  Filled 2019-07-21 (×2): qty 1

## 2019-07-21 MED ORDER — LISINOPRIL 20 MG PO TABS
30.0000 mg | ORAL_TABLET | Freq: Every day | ORAL | Status: DC
  Administered 2019-07-22 – 2019-07-23 (×2): 30 mg via ORAL
  Filled 2019-07-21 (×2): qty 1

## 2019-07-21 MED ORDER — MIRTAZAPINE 15 MG PO TBDP
15.0000 mg | ORAL_TABLET | Freq: Every day | ORAL | Status: DC
  Administered 2019-07-21 – 2019-07-22 (×2): 15 mg via ORAL
  Filled 2019-07-21 (×3): qty 1

## 2019-07-21 MED ORDER — LEVETIRACETAM 500 MG PO TABS
1000.0000 mg | ORAL_TABLET | Freq: Two times a day (BID) | ORAL | Status: DC
  Administered 2019-07-21 – 2019-07-23 (×4): 1000 mg via ORAL
  Filled 2019-07-21 (×5): qty 2

## 2019-07-21 MED ORDER — LABETALOL HCL 5 MG/ML IV SOLN: 10.0000 mg | Freq: Once | INTRAVENOUS | Status: AC

## 2019-07-21 MED ORDER — LABETALOL HCL 5 MG/ML IV SOLN
10.0000 mg | Freq: Once | INTRAVENOUS | Status: AC
  Administered 2019-07-21: 02:00:00 10 mg via INTRAVENOUS
  Filled 2019-07-21: qty 4

## 2019-07-21 MED ORDER — AMLODIPINE BESYLATE 5 MG PO TABS: 5.0000 mg | ORAL_TABLET | Freq: Every day | ORAL | 0 refills | Status: AC

## 2019-07-21 MED ORDER — LABETALOL HCL 5 MG/ML IV SOLN
10.0000 mg | Freq: Once | INTRAVENOUS | Status: AC
  Administered 2019-07-21: 10 mg via INTRAVENOUS

## 2019-07-21 MED ORDER — TRANEXAMIC ACID 1000 MG/10ML IV SOLN
500.0000 mg | Freq: Once | INTRAVENOUS | Status: DC
  Filled 2019-07-21: qty 10

## 2019-07-21 MED ORDER — SODIUM CHLORIDE 0.9% FLUSH
3.0000 mL | Freq: Two times a day (BID) | INTRAVENOUS | Status: DC
  Administered 2019-07-21 – 2019-07-23 (×4): 3 mL via INTRAVENOUS

## 2019-07-21 NOTE — ED Triage Notes (Signed)
Pt comes EMS with nosebleed. Pt was seen yesterday here and packing was removed. Pt was at ENT today and they couldn't control the bleeding due to his HTN. ENT also thinks it's posterior. Reports of pt passing out while at ENT but did not with EMS. Pt was repacked at ENT. Pt was spitting up blood.

## 2019-07-21 NOTE — ED Notes (Addendum)
Pt unable to start/complete  MRI due to uncontrolled epistaxis. Pt taken back to room 11, MD at bedside

## 2019-07-21 NOTE — ED Provider Notes (Signed)
[facility] Emergency Department Provider Note ________________   First MD Initiated Contact with Patient 07/21/19 [phone]     (approximate)  I have reviewed the triage vital signs and the nursing notes.   HISTORY  Chief Complaint Hypertension    HPI Philip Moreno is a 78 y.o. male with below list of previous medical conditions including hypertension, CVA and "dementia" presents to the emergency department secondary to high blood pressure noted tonight.  Patient's wife reported to the triage nurse that the patient was confused earlier as well.  Patient denies any complaints.  No chest pain shortness of breath dizziness headache nausea or vomiting.  Patient denies any weakness numbness or visual changes.  Patient denies any abdominal or back pain.  When asked regarding episodes of confusion earlier the patient states "well I have dementia so sometimes I do not understand what people say".        Past Medical History:  Diagnosis Date  . Arthritis   . Cancer [facility]) 05/2011   prostate cancer, [facility]  . DVT (deep venous thrombosis) ([place])   . GERD (gastroesophageal reflux disease)   . Memory loss   . Pulmonary embolism ([place])   . TIA (transient ischemic attack)     Patient Active Problem List   Diagnosis Date Noted  . Pre-diabetes 02/14/2018  . Atrial fibrillation, persistent ([place]) 02/11/2018  . Benign hypertension with CKD (chronic kidney disease) stage III 11/06/2017  . Hypothyroidism (acquired) 11/06/2017  . Palliative care patient 11/05/2017  . History of pulmonary embolus (PE) 06/13/2017  . Chronic gout 12/23/2015  . History of TIA (transient ischemic attack)   . Vascular dementia with behavioral disturbance ([place])   . Seizure ([place]) 12/20/2015  . History of embolic stroke [id number]  . CKD (chronic kidney disease) stage 3, GFR 30-59 ml/min 12/20/2015  . Arthritis 04/24/2014    Past Surgical History:  Procedure Laterality Date  .  COLONOSCOPY WITH PROPOFOL N/A 02/27/2015   Procedure: COLONOSCOPY WITH PROPOFOL;  Surgeon: Christene Lye, MD;  Location: [facility] ENDOSCOPY;  Service: Endoscopy;  Laterality: N/A;  . LYMPHADENECTOMY  08/25/2012   Procedure: LYMPHADENECTOMY;  Surgeon: Dutch Gray, MD;  Location: [facility];  Service: Urology;  Laterality: Bilateral;  . PROSTATE SURGERY  2012  . ROBOT ASSISTED LAPAROSCOPIC RADICAL PROSTATECTOMY  08/25/2012   Procedure: ROBOTIC ASSISTED LAPAROSCOPIC RADICAL PROSTATECTOMY LEVEL 2;  Surgeon: Dutch Gray, MD;  Location: [facility];  Service: Urology;  Laterality: N/A;    Prior to Admission medications   Medication Sig Start Date End Date Taking? Authorizing Provider  allopurinol (ZYLOPRIM) 100 MG tablet Take 1 tablet by mouth once daily 07/16/19   Karamalegos, Alexander J, DO  AMITIZA 24 MCG capsule TAKE 1 CAPSULE BY MOUTH ONCE DAILY AS NEEDED FOR CONSTIPATION 05/09/18   [provider]  apixaban (ELIQUIS) 5 MG TABS tablet Take 1 tablet (5 mg total) by mouth 2 (two) times daily. 04/24/19   Karamalegos, Devonne Doughty, DO  clotrimazole-betamethasone (LOTRISONE) cream Apply topically 2 (two) times daily. For up to 1-2 weeks, may re-use daily up to 1 week as needed. 05/18/19   Karamalegos, Devonne Doughty, DO  colchicine 0.6 MG tablet Take only as needed for acute gout flare within 24 hours - take 2 tablets, then 1 hour later take 1 more tablet, then switch to 1 tab daily until resolve 08/12/18   Parks Ranger, Alexander J, DO  EUTHYROX 50 MCG tablet TAKE 1 TABLET BY MOUTH ONCE DAILY BEFORE BREAKFAST 02/23/19  Parks Ranger, Devonne Doughty, DO  levETIRAcetam (KEPPRA) 1000 MG tablet Take 1 tablet by mouth twice daily 06/22/19   Parks Ranger, Devonne Doughty, DO  lisinopril (ZESTRIL) 30 MG tablet Take 1 tablet by mouth once daily 02/09/19   Parks Ranger, Devonne Doughty, DO  metoprolol succinate (TOPROL-XL) 100 MG 24 hr tablet TAKE 1 TABLET BY MOUTH ONCE DAILY. TAKE WITH OR IMMEDIATELY FOLLOWING A MEAL. 04/17/19    Karamalegos, Devonne Doughty, DO  mirtazapine (REMERON SOL-TAB) 15 MG disintegrating tablet Take 1 tablet (15 mg total) by mouth at bedtime. 09/07/18 09/07/19  Olin Hauser, DO    Allergies Patient has no known allergies.  Family History  Problem Relation Age of Onset  . Hypertension Mother   . Heart attack Father   . Cancer Brother   . Heart failure Brother   . Diabetes Brother     Social History Social History   Tobacco Use  . Smoking status: Never Smoker  . Smokeless tobacco: Current User    Types: Chew  Substance Use Topics  . Alcohol use: No    Alcohol/week: 0.0 standard drinks  . Drug use: No    Review of Systems Constitutional: No fever/chills Eyes: No visual changes. ENT: No sore throat. Cardiovascular: Denies chest pain. Respiratory: Denies shortness of breath. Gastrointestinal: No abdominal pain.  No nausea, no vomiting.  No diarrhea.  No constipation. Genitourinary: Negative for dysuria. Musculoskeletal: Negative for neck pain.  Negative for back pain. Integumentary: Negative for rash. Neurological: Negative for headaches, focal weakness or numbness.  Positive for reported confusion  ____________________________________________   PHYSICAL EXAM:  VITAL SIGNS: ED Triage Vitals  Enc Vitals Group     BP 07/20/19 2019 (!) 208/120     Pulse Rate 07/20/19 2019 (!) 58     Resp 07/20/19 2019 20     Temp 07/20/19 2019 99.3 F (37.4 C)     Temp Source 07/20/19 2019 Oral     SpO2 07/20/19 2019 96 %     Weight 07/20/19 2019 86.2 kg (190 lb)     Height 07/20/19 2019 1.905 m (6\' 3" )     Head Circumference --      Peak Flow --      Pain Score 07/21/19 0055 0     Pain Loc --      Pain Edu? --      Excl. in [place]? --     Constitutional: Alert and oriented.  Eyes: Conjunctivae are normal.  Head: Atraumatic. Nose: Epistaxis noted from the left nare cannot visualize the area where the patient has bleeding from. Mouth/Throat: Patient is wearing a  mask. Neck: No stridor.  No meningeal signs.   Cardiovascular: Normal rate, regular rhythm. Good peripheral circulation. Grossly normal heart sounds. Respiratory: Normal respiratory effort.  No retractions. Gastrointestinal: Soft and nontender. No distention.  Musculoskeletal: No lower extremity tenderness nor edema. No gross deformities of extremities. Neurologic:  Normal speech and language. No gross focal neurologic deficits are appreciated.  Skin:  Skin is warm, dry and intact. Psychiatric: Mood and affect are normal. Speech and behavior are normal.  ____________________________________________   LABS (all labs ordered are listed, but only abnormal results are displayed)  Labs Reviewed  COMPREHENSIVE METABOLIC PANEL - Abnormal; Notable for the following components:      Result Value   Creatinine, Ser 1.33 (*)    Total Protein 8.3 (*)    AST 42 (*)    ALT 50 (*)    GFR calc non Af Amer 52 (*)  GFR calc Af Amer 60 (*)    All other components within normal limits  PROTIME-INR  APTT  CBC  DIFFERENTIAL   ____________________________________________  EKG  ED ECG REPORT I, [place] Nacole Fluhr, the attending physician, personally viewed and interpreted this ECG.   Date: 07/20/2019  EKG Time: 8:30 PM  Rate: 58  Rhythm: Sinus bradycardia  Axis: Left axis deviation  Intervals: Normal  ST&T Change: None  ____________________________________________  RADIOLOGY I, [place] Klayten Jolliff, personally viewed and evaluated these images (plain radiographs) as part of my medical decision making, as well as reviewing the written report by the radiologist.  ED MD interpretation: CT had revealed atrophy and chronic microvascular disease however no acute intracranial abnormality.  Official radiology report(s): CT HEAD WO CONTRAST  Result Date: 07/20/2019 CLINICAL DATA:  Confusion EXAM: CT HEAD WITHOUT CONTRAST TECHNIQUE: Contiguous axial images were obtained from the base of the skull  through the vertex without intravenous contrast. COMPARISON:  None. FINDINGS: Brain: There is atrophy and chronic small vessel disease changes. No acute intracranial abnormality. Specifically, no hemorrhage, hydrocephalus, mass lesion, acute infarction, or significant intracranial injury. Vascular: No hyperdense vessel or unexpected calcification. Skull: No acute calvarial abnormality. Sinuses/Orbits: Visualized paranasal sinuses and mastoids clear. Orbital soft tissues unremarkable. Other: None IMPRESSION: Atrophy, chronic microvascular disease. No acute intracranial abnormality. Electronically Signed   By: Rolm Baptise M.D.   On: 07/20/2019 20:55      Procedures   ____________________________________________   INITIAL IMPRESSION / MDM / ASSESSMENT AND PLAN / ED COURSE  As part of my medical decision making, I reviewed the following data within the [place] NUMBER 78 year old male presented with above-stated history and physical exam secondary to uncontrolled hypertension, confusion and epistaxis.  Patient required multiple doses of IV labetalol before blood pressure was controlled.  Patient's left nare was packed.  Epistaxis resolved with blood pressure control.  Patient advised to follow-up with ENT this morning. FINAL CLINICAL IMPRESSION(S) / ED DIAGNOSES  Final diagnoses:  Essential hypertension  Epistaxis     MEDICATIONS GIVEN DURING THIS VISIT:  Medications  labetalol (NORMODYNE) injection 10 mg (has no administration in time range)     ED Discharge Orders    None      *Please note:  Philip Moreno was evaluated in Emergency Department on 07/21/2019 for the symptoms described in the history of present illness. He was evaluated in the context of the global COVID-19 pandemic, which necessitated consideration that the patient might be at risk for infection with the SARS-CoV-2 virus that causes COVID-19. Institutional protocols and algorithms that pertain to the  evaluation of patients at risk for COVID-19 are in a state of rapid change based on information released by regulatory bodies including the CDC and federal and state organizations. These policies and algorithms were followed during the patient's care in the ED.  Some ED evaluations and interventions may be delayed as a result of limited staffing during the pandemic.*  Note:  This document was prepared using Dragon voice recognition software and may include unintentional dictation errors.   Gregor Hams, MD 07/27/19 [phone]

## 2019-07-21 NOTE — ED Notes (Addendum)
Pt and Pt's family verbalized understanding of discharge instructions. NAD at this time. 

## 2019-07-21 NOTE — ED Notes (Addendum)
Pt's epistaxis controlled at this time, MD at bedside for re-assessment

## 2019-07-21 NOTE — Telephone Encounter (Signed)
See other TE documenting on this patient from 07/21/19

## 2019-07-21 NOTE — Telephone Encounter (Signed)
Received message that patient's wife, [name], called requesting a call back. Phone call placed to [facility] who shared patient went to ED last night due to nose bleed and elevated BP. Patient was treated and released. [name] shared that acute CVA was ruled out at the hospital and patient is following up with ENT today. Will update Primary Palliative NP

## 2019-07-21 NOTE — ED Notes (Addendum)
Pt continuing to have epistaxis, MD at bedside. Pt was cleaned and placed in a new gown.

## 2019-07-21 NOTE — ED Notes (Signed)
ED TO INPATIENT HANDOFF REPORT  ED Nurse Name and Phone #: Torrie Mayers, RN  [phone]  S Name/Age/Gender Philip Moreno 78 y.o. male Room/Bed: ED31A/ED31A  Code Status   Code Status: Full Code  Home/SNF/Other Home Patient oriented to: self, place, time and situation Is this baseline? Yes   Triage Complete: Triage complete  Chief Complaint Epistaxis, recurrent [R04.0]  Triage Note Pt comes EMS with nosebleed. Pt was seen yesterday here and packing was removed. Pt was at ENT today and they couldn't control the bleeding due to his HTN. ENT also thinks it's posterior. Reports of pt passing out while at ENT but did not with EMS. Pt was repacked at ENT. Pt was spitting up blood.     Allergies No Known Allergies  Level of Care/Admitting Diagnosis ED Disposition    ED Disposition Condition [facility]: [place] [[id number]]  Level of Care: Med-Surg [16]  Covid Evaluation: Asymptomatic Screening Protocol (No Symptoms)  Diagnosis: Epistaxis, recurrent [id number]  Admitting Physician: Lavina Hamman [id number]  Attending Physician: Lavina Hamman [id number]  Bed request comments: MED TELE       B Medical/Surgery History Past Medical History:  Diagnosis Date  . Arthritis   . Cancer [facility]) 05/2011   prostate cancer, [facility]  . DVT (deep venous thrombosis) ([place])   . GERD (gastroesophageal reflux disease)   . Memory loss   . Pulmonary embolism ([place])   . TIA (transient ischemic attack)    Past Surgical History:  Procedure Laterality Date  . COLONOSCOPY WITH PROPOFOL N/A 02/27/2015   Procedure: COLONOSCOPY WITH PROPOFOL;  Surgeon: Christene Lye, MD;  Location: [facility] ENDOSCOPY;  Service: Endoscopy;  Laterality: N/A;  . LYMPHADENECTOMY  08/25/2012   Procedure: LYMPHADENECTOMY;  Surgeon: Dutch Gray, MD;  Location: [facility] ORS;  Service: Urology;  Laterality: Bilateral;  . PROSTATE SURGERY  2012  . ROBOT ASSISTED LAPAROSCOPIC  RADICAL PROSTATECTOMY  08/25/2012   Procedure: ROBOTIC ASSISTED LAPAROSCOPIC RADICAL PROSTATECTOMY LEVEL 2;  Surgeon: Dutch Gray, MD;  Location: [facility];  Service: Urology;  Laterality: N/A;     A IV Location/Drains/Wounds Patient Lines/Drains/Airways Status   Active Line/Drains/Airways    Name:   Placement date:   Placement time:   Site:   Days:   Peripheral IV 07/21/19 Right Antecubital   07/21/19    0136    Antecubital   less than 1   Peripheral IV 07/21/19 Right Wrist   07/21/19    1700    Wrist   less than 1          Intake/Output Last 24 hours No intake or output data in the 24 hours ending 07/21/19 2336  Labs/Imaging Results for orders placed or performed during the hospital encounter of 07/21/19 (from the past 48 hour(s))  CBC with Differential     Status: None   Collection Time: 07/21/19  3:46 PM  Result Value Ref Range   WBC 9.1 4.0 - 10.5 K/uL   RBC 5.03 4.22 - 5.81 MIL/uL   Hemoglobin 14.1 13.0 - 17.0 g/dL   HCT 43.3 39.0 - 52.0 %   MCV 86.1 80.0 - 100.0 fL   MCH 28.0 26.0 - 34.0 pg   MCHC 32.6 30.0 - 36.0 g/dL   RDW 13.9 11.5 - 15.5 %   Platelets 222 150 - 400 K/uL   nRBC 0.0 0.0 - 0.2 %   Neutrophils Relative % 72 %   Neutro Abs 6.5 1.7 -  7.7 K/uL   Lymphocytes Relative 19 %   Lymphs Abs 1.7 0.7 - 4.0 K/uL   Monocytes Relative 8 %   Monocytes Absolute 0.8 0.1 - 1.0 K/uL   Eosinophils Relative 1 %   Eosinophils Absolute 0.1 0.0 - 0.5 K/uL   Basophils Relative 0 %   Basophils Absolute 0.0 0.0 - 0.1 K/uL   Immature Granulocytes 0 %   Abs Immature Granulocytes 0.02 0.00 - 0.07 K/uL    Comment: Performed at [facility], [address] [place], [place] [address]   CT HEAD WO CONTRAST  Result Date: 07/20/2019 CLINICAL DATA:  Confusion EXAM: CT HEAD WITHOUT CONTRAST TECHNIQUE: Contiguous axial images were obtained from the base of the skull through the vertex without intravenous contrast. COMPARISON:  None. FINDINGS: Brain: There is atrophy and  chronic small vessel disease changes. No acute intracranial abnormality. Specifically, no hemorrhage, hydrocephalus, mass lesion, acute infarction, or significant intracranial injury. Vascular: No hyperdense vessel or unexpected calcification. Skull: No acute calvarial abnormality. Sinuses/Orbits: Visualized paranasal sinuses and mastoids clear. Orbital soft tissues unremarkable. Other: None IMPRESSION: Atrophy, chronic microvascular disease. No acute intracranial abnormality. Electronically Signed   By: Rolm Baptise M.D.   On: 07/20/2019 20:55   MR BRAIN WO CONTRAST  Result Date: 07/21/2019 CLINICAL DATA:  Hypertension.  Acute onset of confusion.  Epistaxis. EXAM: MRI HEAD WITHOUT CONTRAST TECHNIQUE: Multiplanar, multiecho pulse sequences of the brain and surrounding structures were obtained without intravenous contrast. COMPARISON:  CT head without contrast 07/20/2019. MR head without and with contrast 12/20/2015 FINDINGS: Brain: Diffusion-weighted images demonstrate no acute or subacute infarction. Multiple remote lacunar infarcts are present throughout the cerebellum bilaterally. A remote nonhemorrhagic lacunar infarct is present in the left thalamus. Remote ischemic changes are present in the right thalamus. Confluent periventricular T2 hyperintensities are present bilaterally. There is thinning of the corpus callosum. Marked atrophy is noted. Remote cortical infarct is present in the anterior frontal lobes bilaterally. The ventricles are proportionate to the degree of atrophy. No significant extraaxial fluid collection is present. Vascular: Flow is present in the major intracranial arteries. Skull and upper cervical spine: The craniocervical junction is normal. Upper cervical spine is within normal limits. Marrow signal is unremarkable. Sinuses/Orbits: Small fluid levels are present in the maxillary sinuses bilaterally. There is associated T1 shortening, likely hemorrhage. Additional nonhemorrhagic fluid  is present in the nasopharynx. Mastoid air cells are clear. Remaining paranasal sinuses are clear. The globes and orbits are within normal limits. IMPRESSION: 1. No acute intracranial abnormality. 2. 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 3. Advanced atrophy and diffuse white matter disease. This likely reflects the sequela of chronic microvascular ischemia. 4. Fluid levels in the maxillary sinuses bilaterally compatible with hemorrhage. 5. Asymmetric congestion of the right nasal cavity and additional nasopharyngeal fluid, less likely blood. Electronically Signed   By: San Morelle M.D.   On: 07/21/2019 05:06   DG Chest Port 1 View  Result Date: 07/21/2019 CLINICAL DATA:  Shortness of breath EXAM: PORTABLE CHEST 1 VIEW COMPARISON:  06/16/2017 FINDINGS: Heart is normal size. Tortuosity of the thoracic aorta. Lungs clear. No effusions or acute bony abnormality. IMPRESSION: No active disease. Electronically Signed   By: Rolm Baptise M.D.   On: 07/21/2019 18:07    Pending Labs Unresulted Labs (From admission, onward)    Start     Ordered   07/22/19 0500  Comprehensive metabolic panel  Tomorrow morning,   STAT     07/21/19 1901  07/22/19 0500  CBC  Tomorrow morning,   STAT     07/21/19 1901          Vitals/Pain Today's Vitals   07/21/19 1954 07/21/19 2055 07/21/19 2152 07/21/19 2210  BP: 123/88 110/68 (!) 131/98 119/89  Pulse: 72 81 80 76  Resp: 16 18 16 16   Temp: 97.6 F (36.4 C)   97.8 F (36.6 C)  TempSrc: Oral   Oral  SpO2: 96% 97% 96% 96%  Weight:      Height:      PainSc: 0-No pain   0-No pain    Isolation Precautions No active isolations  Medications Medications  amLODipine (NORVASC) tablet 5 mg (0 mg Oral Hold 07/21/19 1918)  levothyroxine (SYNTHROID) tablet 50 mcg (0 mcg Oral Hold 07/21/19 1918)  levETIRAcetam (KEPPRA) tablet 1,000 mg (1,000 mg Oral Given 07/21/19 2212)  lisinopril (ZESTRIL) tablet 30 mg (0 mg Oral Hold  07/21/19 1917)  metoprolol succinate (TOPROL-XL) 24 hr tablet 100 mg (0 mg Oral Hold 07/21/19 1917)  mirtazapine (REMERON SOL-TAB) disintegrating tablet 15 mg (15 mg Oral Given 07/21/19 2212)  sodium chloride flush (NS) 0.9 % injection 3 mL (3 mLs Intravenous Given 07/21/19 2216)  acetaminophen (TYLENOL) tablet 650 mg (has no administration in time range)    Or  acetaminophen (TYLENOL) suppository 650 mg (has no administration in time range)  ondansetron (ZOFRAN) tablet 4 mg (has no administration in time range)    Or  ondansetron (ZOFRAN) injection 4 mg (has no administration in time range)  oxymetazoline (AFRIN) 0.05 % nasal spray 1 spray (has no administration in time range)    Mobility walks with person assist High fall risk   Focused Assessments Cardiac Assessment Handoff:    Lab Results  Component Value Date   TROPONINI 0.07 ([place]) 06/14/2017   No results found for: DDIMER Does the Patient currently have chest pain? No      R Recommendations: See Admitting Provider Note  Report given to:   Additional Notes:

## 2019-07-21 NOTE — Telephone Encounter (Signed)
Called by Dr. Hope Budds, BPs running quite high in the 200s/100s. BP at Dr. Reola Mosher 174/102. Has been having nose bleeds. Needs to get BPs under control. Unfortunately I cannot see him in the office as we are closed this afternoon. Will start him on 5mg  of amlodipine for over the weekend. Rx sent to his pharmacy.   Please get him scheduled to see Dr. Raliegh Ip early next week for follow up on BP and nosebleeds

## 2019-07-21 NOTE — Telephone Encounter (Signed)
[facility] called regarding this patient he was in the ED for severe nose bleed and his blood pressure was high and med's were not working, he was discharged today and wife confirmed about upcoming hospital follow up appt but now receive phone call from ENT seeing patient right now has severe nose bleed and HTN and med's not working and he is on blood thinner, please suggest they expecting phone call back from provider [phone] hit option to talk to provider.

## 2019-07-21 NOTE — ED Provider Notes (Signed)
[facility] Emergency Department Provider Note ____________________________________________   I have reviewed the triage vital signs and the nursing notes.   HISTORY  Chief Complaint Epistaxis   History limited by and level 5 caveat due to: Dementia. History primarily obtained through record and in conversation with ENT Dr. Richardson Landry.  HPI Philip Moreno is a 78 y.o. male who presents to the emergency department today from ENT clinic because of concern for intermittent nose bleed and hypertension. Patient was seen in the emergency department last night for a nose bleed. Went to ENT for follow up. Was found to have some bleeding from the right nares, thought to be posterior since no anterior source was visualized. While there he was also noted to have elevated blood pressure. Right nares was packed. The patient then had a bradycardic episode and started having heavy bleeding through the packing. The left nares was then packed. Patient himself could not give any significant history.   Records reviewed. Per medical record review patient has a history of ER visit last night for nose bleed. Patient on eliquis.   Past Medical History:  Diagnosis Date  . Arthritis   . Cancer [facility]) 05/2011   prostate cancer, [facility]  . DVT (deep venous thrombosis) ([place])   . GERD (gastroesophageal reflux disease)   . Memory loss   . Pulmonary embolism ([place])   . TIA (transient ischemic attack)     Patient Active Problem List   Diagnosis Date Noted  . Pre-diabetes 02/14/2018  . Atrial fibrillation, persistent ([place]) 02/11/2018  . Benign hypertension with CKD (chronic kidney disease) stage III 11/06/2017  . Hypothyroidism (acquired) 11/06/2017  . Palliative care patient 11/05/2017  . History of pulmonary embolus ([facility]) 06/13/2017  . Chronic gout 12/23/2015  . History of TIA (transient ischemic attack)   . Vascular dementia with behavioral disturbance ([place])   . Seizure ([place])  12/20/2015  . History of embolic stroke [id number]  . CKD (chronic kidney disease) stage 3, GFR 30-59 ml/min 12/20/2015  . Arthritis 04/24/2014    Past Surgical History:  Procedure Laterality Date  . COLONOSCOPY WITH PROPOFOL N/A 02/27/2015   Procedure: COLONOSCOPY WITH PROPOFOL;  Surgeon: Christene Lye, MD;  Location: [facility] ENDOSCOPY;  Service: Endoscopy;  Laterality: N/A;  . LYMPHADENECTOMY  08/25/2012   Procedure: LYMPHADENECTOMY;  Surgeon: Dutch Gray, MD;  Location: [facility];  Service: Urology;  Laterality: Bilateral;  . PROSTATE SURGERY  2012  . ROBOT ASSISTED LAPAROSCOPIC RADICAL PROSTATECTOMY  08/25/2012   Procedure: ROBOTIC ASSISTED LAPAROSCOPIC RADICAL PROSTATECTOMY LEVEL 2;  Surgeon: Dutch Gray, MD;  Location: [facility];  Service: Urology;  Laterality: N/A;    Prior to Admission medications   Medication Sig Start Date End Date Taking? Authorizing Provider  allopurinol (ZYLOPRIM) 100 MG tablet Take 1 tablet by mouth once daily 07/16/19   Karamalegos, Alexander J, DO  AMITIZA 24 MCG capsule TAKE 1 CAPSULE BY MOUTH ONCE DAILY AS NEEDED FOR CONSTIPATION 05/09/18   [provider]  amLODipine (NORVASC) 5 MG tablet Take 1 tablet (5 mg total) by mouth daily. 07/21/19   Johnson, Megan P, DO  apixaban (ELIQUIS) 5 MG TABS tablet Take 1 tablet (5 mg total) by mouth 2 (two) times daily. 04/24/19   Karamalegos, Devonne Doughty, DO  clotrimazole-betamethasone (LOTRISONE) cream Apply topically 2 (two) times daily. For up to 1-2 weeks, may re-use daily up to 1 week as needed. 05/18/19   Karamalegos, Devonne Doughty, DO  colchicine 0.6 MG tablet Take only as  needed for acute gout flare within 24 hours - take 2 tablets, then 1 hour later take 1 more tablet, then switch to 1 tab daily until resolve 08/12/18   Olin Hauser, DO  EUTHYROX 50 MCG tablet TAKE 1 TABLET BY MOUTH ONCE DAILY BEFORE BREAKFAST 02/23/19   Parks Ranger, Devonne Doughty, DO  levETIRAcetam (KEPPRA) 1000 MG tablet Take 1 tablet  by mouth twice daily 06/22/19   Parks Ranger, Devonne Doughty, DO  lisinopril (ZESTRIL) 30 MG tablet Take 1 tablet by mouth once daily 02/09/19   Karamalegos, Devonne Doughty, DO  metoprolol succinate (TOPROL-XL) 100 MG 24 hr tablet TAKE 1 TABLET BY MOUTH ONCE DAILY. TAKE WITH OR IMMEDIATELY FOLLOWING A MEAL. 04/17/19   Karamalegos, Devonne Doughty, DO  mirtazapine (REMERON SOL-TAB) 15 MG disintegrating tablet Take 1 tablet (15 mg total) by mouth at bedtime. 09/07/18 09/07/19  Olin Hauser, DO    Allergies Patient has no known allergies.  Family History  Problem Relation Age of Onset  . Hypertension Mother   . Heart attack Father   . Cancer Brother   . Heart failure Brother   . Diabetes Brother     Social History Social History   Tobacco Use  . Smoking status: Never Smoker  . Smokeless tobacco: Current User    Types: Chew  Substance Use Topics  . Alcohol use: No    Alcohol/week: 0.0 standard drinks  . Drug use: No    Review of Systems Unable to obtain reliable ROS from patient.  ____________________________________________   PHYSICAL EXAM:  VITAL SIGNS: ED Triage Vitals  Enc Vitals Group     BP 07/21/19 1529 126/62     Pulse Rate 07/21/19 1529 74     Resp 07/21/19 1529 18     Temp 07/21/19 1529 98.8 F (37.1 C)     Temp Source 07/21/19 1529 Axillary     SpO2 07/21/19 1529 95 %     Weight 07/21/19 1530 189 lb 9.5 oz (86 kg)     Height 07/21/19 1530 6\' 3"  (1.905 m)     Head Circumference --      Peak Flow --      Pain Score 07/21/19 1530 0   Constitutional: Awake and alert. Not oriented to events.  Eyes: Conjunctivae are normal.  ENT      Head: Normocephalic and atraumatic.      Nose: Bilateral nasal packing. No obvious bleeding at this time.       Mouth/Throat: Mucous membranes are moist.      Neck: No stridor. Hematological/Lymphatic/Immunilogical: No cervical lymphadenopathy. Cardiovascular: Normal rate, regular rhythm.  No murmurs, rubs, or gallops.   Respiratory: Normal respiratory effort without tachypnea nor retractions. Breath sounds are clear and equal bilaterally. No wheezes/rales/rhonchi. Gastrointestinal: Soft and non tender. No rebound. No guarding.  Genitourinary: Deferred Musculoskeletal: Normal range of motion in all extremities. No lower extremity edema. Neurologic:  Awake and alert. Not completely oriented.  Skin:  Skin is warm, dry and intact. No rash noted. Psychiatric: Mood and affect are normal. Speech and behavior are normal. Patient exhibits appropriate insight and judgment.  ____________________________________________    LABS (pertinent positives/negatives)  CBC wbc 9.1, hgb 14.1, plt 222  ____________________________________________   EKG  None  ____________________________________________    RADIOLOGY  None  ____________________________________________   PROCEDURES  Procedures  ____________________________________________   INITIAL IMPRESSION / ASSESSMENT AND PLAN / ED COURSE  Pertinent labs & imaging results that were available during my care of the patient were  reviewed by me and considered in my medical decision making (see chart for details).   Patient presents to the ED today from ENT clinic because of concern for nosebleed and elevated blood pressure. Patient is on eliquis. Blood pressure wnl here in the emergency department. Discussed with Dr. Richardson Landry with ENT. At this time he does recommend admission for observation and blood pressure control.    ____________________________________________   FINAL CLINICAL IMPRESSION(S) / ED DIAGNOSES  Final diagnoses:  Epistaxis     Note: This dictation was prepared with Dragon dictation. Any transcriptional errors that result from this process are unintentional     Nance Pear, MD 07/21/19 [phone]

## 2019-07-21 NOTE — ED Notes (Addendum)
Bleeding from nares currently controlled with nose clamp pressure. MD given order to proceed with MRI> Pt Taken to MRI @0205am 

## 2019-07-21 NOTE — Telephone Encounter (Signed)
Patient's appointment scheduled for Monday around 3:40 pm face to face left message for spouse.

## 2019-07-21 NOTE — H&P (Signed)
[facility] History and Physical   Patient: Philip Moreno [id number]   PCP: Olin Hauser, DO DOB: May 15, 1941   DOA: 07/21/2019   DOS: 07/21/2019   DOS: the patient was seen and examined on 07/21/2019  Patient coming from: The patient is coming from Home  Chief Complaint: Bleeding through the nose  HPI: Philip Moreno is a 78 y.o. male with Past medical history of prostate cancer, dementia, GERD, PE on anticoagulation although PE was back in 2017. Patient presents with complaints of frequent episodes of nosebleeding. Patient was seen in the ER.  Patient was seen in the ER for epistaxis patient is currently on anticoagulation with Eliquis. After initial packing patient was discharged to home and no follow-up with ENT. When the patient was in the ENT they were not able to stop patient's nose bleeding. Along with that patient actually also had a high blood pressure.  There was an episode of bradycardia and had a syncopal event at the ENT office as well. At the time of my evaluation patient denies any acute complaint no nausea no vomiting no fever no chills.  Patient does have some nasal discomfort due to packing.  No focal deficit.  No blood in the stool or anywhere.  ED Course: Hemoglobin dropped from 15-14.  MRI brain was performed with the concern for confusion although it was negative for any acute stroke does show active bleeding in the maxillary sinus on both sides. COVID-19 test contraindicated due to bilateral epistaxis. All the patient is actually low risk based on lack of symptoms as well as lack of any pulmonary infiltrate and therefore would not place the patient on any isolation.  At his baseline ambulates without assistance independent for most of his ADL;  manages his medication on his own.  Review of Systems: as mentioned in the history of present illness.  All other systems reviewed and are negative.  Past Medical History:  Diagnosis Date  .  Arthritis   . Cancer [facility]) 05/2011   prostate cancer, [facility]  . DVT (deep venous thrombosis) ([place])   . GERD (gastroesophageal reflux disease)   . Memory loss   . Pulmonary embolism ([place])   . TIA (transient ischemic attack)    Past Surgical History:  Procedure Laterality Date  . COLONOSCOPY WITH PROPOFOL N/A 02/27/2015   Procedure: COLONOSCOPY WITH PROPOFOL;  Surgeon: Christene Lye, MD;  Location: [facility] ENDOSCOPY;  Service: Endoscopy;  Laterality: N/A;  . LYMPHADENECTOMY  08/25/2012   Procedure: LYMPHADENECTOMY;  Surgeon: Dutch Gray, MD;  Location: [facility] ORS;  Service: Urology;  Laterality: Bilateral;  . PROSTATE SURGERY  2012  . ROBOT ASSISTED LAPAROSCOPIC RADICAL PROSTATECTOMY  08/25/2012   Procedure: ROBOTIC ASSISTED LAPAROSCOPIC RADICAL PROSTATECTOMY LEVEL 2;  Surgeon: Dutch Gray, MD;  Location: [facility] ORS;  Service: Urology;  Laterality: N/A;   Social History:  reports that he has never smoked. His smokeless tobacco use includes chew. He reports that he does not drink alcohol or use drugs.  No Known Allergies  Family history reviewed and not pertinent Family History  Problem Relation Age of Onset  . Hypertension Mother   . Heart attack Father   . Cancer Brother   . Heart failure Brother   . Diabetes Brother      Prior to Admission medications   Medication Sig Start Date End Date Taking? Authorizing Provider  allopurinol (ZYLOPRIM) 100 MG tablet Take 1 tablet by mouth once daily Patient taking differently: Take 100 mg by mouth  daily.  07/16/19  Yes Karamalegos, Devonne Doughty, DO  apixaban (ELIQUIS) 5 MG TABS tablet Take 1 tablet (5 mg total) by mouth 2 (two) times daily. 04/24/19  Yes Karamalegos, Devonne Doughty, DO  colchicine 0.6 MG tablet Take only as needed for acute gout flare within 24 hours - take 2 tablets, then 1 hour later take 1 more tablet, then switch to 1 tab daily until resolve 08/12/18  Yes Karamalegos, Alexander J, DO  EUTHYROX 50 MCG tablet TAKE 1 TABLET BY  MOUTH ONCE DAILY BEFORE BREAKFAST Patient taking differently: Take 50 mcg by mouth daily.  02/23/19  Yes Karamalegos, Devonne Doughty, DO  levETIRAcetam (KEPPRA) 1000 MG tablet Take 1 tablet by mouth twice daily Patient taking differently: Take 1,000 mg by mouth 2 (two) times daily.  06/22/19  Yes Karamalegos, Devonne Doughty, DO  lisinopril (ZESTRIL) 30 MG tablet Take 1 tablet by mouth once daily 02/09/19  Yes Karamalegos, Alexander J, DO  metoprolol succinate (TOPROL-XL) 100 MG 24 hr tablet TAKE 1 TABLET BY MOUTH ONCE DAILY. TAKE WITH OR IMMEDIATELY FOLLOWING A MEAL. Patient taking differently: Take 100 mg by mouth daily.  04/17/19  Yes Karamalegos, Devonne Doughty, DO  mirtazapine (REMERON SOL-TAB) 15 MG disintegrating tablet Take 1 tablet (15 mg total) by mouth at bedtime. 09/07/18 09/07/19 Yes Karamalegos, Devonne Doughty, DO  amLODipine (NORVASC) 5 MG tablet Take 1 tablet (5 mg total) by mouth daily. 07/21/19   Johnson, Megan P, DO  clotrimazole-betamethasone (LOTRISONE) cream Apply topically 2 (two) times daily. For up to 1-2 weeks, may re-use daily up to 1 week as needed. 05/18/19   Olin Hauser, DO    Physical Exam: Vitals:   07/21/19 1530 07/21/19 1600 07/21/19 1630 07/21/19 1812  BP: (!) 126/92 (!) 129/99 119/90 (!) 132/97  Pulse: 72   74  Resp:    18  Temp:      TempSrc:      SpO2: 94%   97%  Weight: 86 kg     Height: 6\' 3"  (1.905 m)       General: alert and oriented to time, place, and person. Appear in mild distress, affect appropriate Eyes: PERRL, Conjunctiva normal ENT: Oral Mucosa Clear, moist  Neck: no JVD, no Abnormal Mass Or lumps Cardiovascular: S1 and S2 Present, no Murmur, peripheral pulses symmetrical Respiratory: good respiratory effort, Bilateral Air entry equal and Decreased, no signs of accessory muscle use, Clear to Auscultation, no Crackles, no wheezes Abdomen: Bowel Sound present, Soft and no tenderness, no hernia Skin: no rashes  Extremities: no Pedal edema, no  calf tenderness Neurologic: without any new focal findings Gait not checked due to patient safety concerns  Data Reviewed: I have personally reviewed and interpreted labs, imaging as discussed below.  CBC: Recent Labs  Lab 07/17/19 1643 07/20/19 2030 07/21/19 1546  WBC 8.6 7.8 9.1  NEUTROABS  --  4.5 6.5  HGB 15.7 15.1 14.1  HCT 46.8 46.9 43.3  MCV 82.1 86.4 86.1  PLT 211 214 [id number]   Basic Metabolic Panel: Recent Labs  Lab 07/20/19 2030  NA 142  K 3.7  CL 107  CO2 24  GLUCOSE 96  BUN 16  CREATININE 1.33*  CALCIUM 9.0   GFR: Estimated Creatinine Clearance: 56.5 mL/min (A) (by C-G formula based on SCr of 1.33 mg/dL (H)). Liver Function Tests: Recent Labs  Lab 07/20/19 2030  AST 42*  ALT 50*  ALKPHOS 47  BILITOT 0.7  PROT 8.3*  ALBUMIN 4.1   No results  for input(s): LIPASE, AMYLASE in the last 168 hours. No results for input(s): AMMONIA in the last 168 hours. Coagulation Profile: Recent Labs  Lab 07/20/19 2030  INR 1.1   Cardiac Enzymes: No results for input(s): CKTOTAL, CKMB, CKMBINDEX, TROPONINI in the last 168 hours. BNP (last 3 results) No results for input(s): PROBNP in the last 8760 hours. HbA1C: No results for input(s): HGBA1C in the last 72 hours. CBG: No results for input(s): GLUCAP in the last 168 hours. Lipid Profile: No results for input(s): CHOL, HDL, LDLCALC, TRIG, CHOLHDL, LDLDIRECT in the last 72 hours. Thyroid Function Tests: No results for input(s): TSH, T4TOTAL, FREET4, T3FREE, THYROIDAB in the last 72 hours. Anemia Panel: No results for input(s): VITAMINB12, FOLATE, FERRITIN, TIBC, IRON, RETICCTPCT in the last 72 hours. Urine analysis:    Component Value Date/Time   COLORURINE YELLOW (A) 07/21/2019 0114   APPEARANCEUR CLEAR (A) 07/21/2019 0114   APPEARANCEUR Clear 09/23/2011 1739   LABSPEC 1.016 07/21/2019 0114   LABSPEC 1.003 09/23/2011 1739   PHURINE 6.0 07/21/2019 0114   GLUCOSEU NEGATIVE 07/21/2019 0114   GLUCOSEU  Negative 09/23/2011 1739   HGBUR MODERATE (A) 07/21/2019 0114   BILIRUBINUR NEGATIVE 07/21/2019 0114   BILIRUBINUR Negative 09/23/2011 1739   KETONESUR NEGATIVE 07/21/2019 0114   PROTEINUR NEGATIVE 07/21/2019 0114   NITRITE NEGATIVE 07/21/2019 0114   LEUKOCYTESUR NEGATIVE 07/21/2019 0114   LEUKOCYTESUR Negative 09/23/2011 1739    Radiological Exams on Admission: CT HEAD WO CONTRAST  Result Date: 07/20/2019 CLINICAL DATA:  Confusion EXAM: CT HEAD WITHOUT CONTRAST TECHNIQUE: Contiguous axial images were obtained from the base of the skull through the vertex without intravenous contrast. COMPARISON:  None. FINDINGS: Brain: There is atrophy and chronic small vessel disease changes. No acute intracranial abnormality. Specifically, no hemorrhage, hydrocephalus, mass lesion, acute infarction, or significant intracranial injury. Vascular: No hyperdense vessel or unexpected calcification. Skull: No acute calvarial abnormality. Sinuses/Orbits: Visualized paranasal sinuses and mastoids clear. Orbital soft tissues unremarkable. Other: None IMPRESSION: Atrophy, chronic microvascular disease. No acute intracranial abnormality. Electronically Signed   By: Rolm Baptise M.D.   On: 07/20/2019 20:55   MR BRAIN WO CONTRAST  Result Date: 07/21/2019 CLINICAL DATA:  Hypertension.  Acute onset of confusion.  Epistaxis. EXAM: MRI HEAD WITHOUT CONTRAST TECHNIQUE: Multiplanar, multiecho pulse sequences of the brain and surrounding structures were obtained without intravenous contrast. COMPARISON:  CT head without contrast 07/20/2019. MR head without and with contrast 12/20/2015 FINDINGS: Brain: Diffusion-weighted images demonstrate no acute or subacute infarction. Multiple remote lacunar infarcts are present throughout the cerebellum bilaterally. A remote nonhemorrhagic lacunar infarct is present in the left thalamus. Remote ischemic changes are present in the right thalamus. Confluent periventricular T2 hyperintensities  are present bilaterally. There is thinning of the corpus callosum. Marked atrophy is noted. Remote cortical infarct is present in the anterior frontal lobes bilaterally. The ventricles are proportionate to the degree of atrophy. No significant extraaxial fluid collection is present. Vascular: Flow is present in the major intracranial arteries. Skull and upper cervical spine: The craniocervical junction is normal. Upper cervical spine is within normal limits. Marrow signal is unremarkable. Sinuses/Orbits: Small fluid levels are present in the maxillary sinuses bilaterally. There is associated T1 shortening, likely hemorrhage. Additional nonhemorrhagic fluid is present in the nasopharynx. Mastoid air cells are clear. Remaining paranasal sinuses are clear. The globes and orbits are within normal limits. IMPRESSION: 1. No acute intracranial abnormality. 2. 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 3. Advanced atrophy and diffuse white  matter disease. This likely reflects the sequela of chronic microvascular ischemia. 4. Fluid levels in the maxillary sinuses bilaterally compatible with hemorrhage. 5. Asymmetric congestion of the right nasal cavity and additional nasopharyngeal fluid, less likely blood. Electronically Signed   By: San Morelle M.D.   On: 07/21/2019 05:06   DG Chest Port 1 View  Result Date: 07/21/2019 CLINICAL DATA:  Shortness of breath EXAM: PORTABLE CHEST 1 VIEW COMPARISON:  06/16/2017 FINDINGS: Heart is normal size. Tortuosity of the thoracic aorta. Lungs clear. No effusions or acute bony abnormality. IMPRESSION: No active disease. Electronically Signed   By: Rolm Baptise M.D.   On: 07/21/2019 18:07   I reviewed all nursing notes, pharmacy notes, vitals, pertinent old records.  Assessment/Plan 1.  Epistaxis Dr. Richardson Landry abdominal pain are better with the patient. Patient bilaterally currently holding off on Eliquis #monitor. MRI does not show any  acute abnormality.  2.  History of PE. Currently holding off on Eliquis given the patient's recurrence  3.  Acute blood loss anemia. H&H dropped from 15-14. We will continue to monitor. Currently asymptomatic.  4.  History of prostate cancer. Monitor  5. Pt being followed by [facility] for palliative care, not on hospice.   Nutrition: Cardiac diet DVT Prophylaxis: SCD, pharmacological prophylaxis contraindicated due to Epistaxis  Advance goals of care discussion: Full code   Consults: EDP discussed with Dr. Richardson Landry who in turn discussed with Dr. Tami Ribas from ENT.  Family Communication: no family was present at bedside, at the time of interview.  Disposition: Admitted as observation, telemetry unit. Likely to be discharged home, in 1 days.  I have discussed plan of care as described above with RN and patient/family.  Author: Berle Mull, MD [facility] 07/21/2019 7:02 PM   To reach On-call, see care teams to locate the attending and reach out to them via [email or web address]. If 7PM-7AM, please contact night-coverage If you still have difficulty reaching the attending provider, please page the [facility] (Director on Call) for [facility] on amion for assistance.

## 2019-07-21 NOTE — ED Notes (Addendum)
MD placed "rapid rhino" into left nare for uncontrolled bleeding. Slight bleeding from right nare at this time.   Decreased epistaxis @ 0240

## 2019-07-22 DIAGNOSIS — Z86718 Personal history of other venous thrombosis and embolism: Secondary | ICD-10-CM | POA: Diagnosis not present

## 2019-07-22 DIAGNOSIS — K219 Gastro-esophageal reflux disease without esophagitis: Secondary | ICD-10-CM | POA: Diagnosis present

## 2019-07-22 DIAGNOSIS — Z86711 Personal history of pulmonary embolism: Secondary | ICD-10-CM | POA: Diagnosis not present

## 2019-07-22 DIAGNOSIS — F0390 Unspecified dementia without behavioral disturbance: Secondary | ICD-10-CM | POA: Diagnosis present

## 2019-07-22 DIAGNOSIS — D62 Acute posthemorrhagic anemia: Secondary | ICD-10-CM | POA: Diagnosis present

## 2019-07-22 DIAGNOSIS — R040 Epistaxis: Secondary | ICD-10-CM | POA: Diagnosis not present

## 2019-07-22 DIAGNOSIS — F17220 Nicotine dependence, chewing tobacco, uncomplicated: Secondary | ICD-10-CM | POA: Diagnosis present

## 2019-07-22 DIAGNOSIS — Z7901 Long term (current) use of anticoagulants: Secondary | ICD-10-CM | POA: Diagnosis not present

## 2019-07-22 DIAGNOSIS — M109 Gout, unspecified: Secondary | ICD-10-CM | POA: Diagnosis present

## 2019-07-22 DIAGNOSIS — Z833 Family history of diabetes mellitus: Secondary | ICD-10-CM | POA: Diagnosis not present

## 2019-07-22 DIAGNOSIS — Z8249 Family history of ischemic heart disease and other diseases of the circulatory system: Secondary | ICD-10-CM | POA: Diagnosis not present

## 2019-07-22 DIAGNOSIS — Z8673 Personal history of transient ischemic attack (TIA), and cerebral infarction without residual deficits: Secondary | ICD-10-CM | POA: Diagnosis not present

## 2019-07-22 DIAGNOSIS — Z79899 Other long term (current) drug therapy: Secondary | ICD-10-CM | POA: Diagnosis not present

## 2019-07-22 DIAGNOSIS — Z8546 Personal history of malignant neoplasm of prostate: Secondary | ICD-10-CM | POA: Diagnosis not present

## 2019-07-22 DIAGNOSIS — R569 Unspecified convulsions: Secondary | ICD-10-CM | POA: Diagnosis present

## 2019-07-22 DIAGNOSIS — I10 Essential (primary) hypertension: Secondary | ICD-10-CM | POA: Diagnosis present

## 2019-07-22 DIAGNOSIS — E039 Hypothyroidism, unspecified: Secondary | ICD-10-CM | POA: Diagnosis present

## 2019-07-22 LAB — COMPREHENSIVE METABOLIC PANEL
ALT: 31 U/L (ref 0–44)
AST: 27 U/L (ref 15–41)
Albumin: 3.3 g/dL — ABNORMAL LOW (ref 3.5–5.0)
Alkaline Phosphatase: 33 U/L — ABNORMAL LOW (ref 38–126)
Anion gap: 11 (ref 5–15)
BUN: 25 mg/dL — ABNORMAL HIGH (ref 8–23)
CO2: 22 mmol/L (ref 22–32)
Calcium: 8.7 mg/dL — ABNORMAL LOW (ref 8.9–10.3)
Chloride: 109 mmol/L (ref 98–111)
Creatinine, Ser: 1.18 mg/dL (ref 0.61–1.24)
GFR calc Af Amer: >60 mL/min (ref >60)
GFR calc non Af Amer: 60 mL/min — ABNORMAL LOW (ref >60)
Glucose, Bld: 100 mg/dL — ABNORMAL HIGH (ref 70–99)
Potassium: 3.3 mmol/L — ABNORMAL LOW (ref 3.5–5.1)
Sodium: 142 mmol/L (ref 135–145)
Total Bilirubin: 0.8 mg/dL (ref 0.3–1.2)
Total Protein: 6.5 g/dL (ref 6.5–8.1)

## 2019-07-22 LAB — CBC WITH DIFFERENTIAL/PLATELET
Abs Immature Granulocytes: 0.04 10*3/uL (ref 0.00–0.07)
Basophils Absolute: 0 10*3/uL (ref 0.0–0.1)
Basophils Relative: 0 %
Eosinophils Absolute: 0.2 10*3/uL (ref 0.0–0.5)
Eosinophils Relative: 2 %
HCT: 37 % — ABNORMAL LOW (ref 39.0–52.0)
Hemoglobin: 12.2 g/dL — ABNORMAL LOW (ref 13.0–17.0)
Immature Granulocytes: 0 %
Lymphocytes Relative: 24 %
Lymphs Abs: 2.6 10*3/uL (ref 0.7–4.0)
MCH: 27.4 pg (ref 26.0–34.0)
MCHC: 33 g/dL (ref 30.0–36.0)
MCV: 83 fL (ref 80.0–100.0)
Monocytes Absolute: 1 10*3/uL (ref 0.1–1.0)
Monocytes Relative: 9 %
Neutro Abs: 6.8 10*3/uL (ref 1.7–7.7)
Neutrophils Relative %: 65 %
Platelets: 196 10*3/uL (ref 150–400)
RBC: 4.46 MIL/uL (ref 4.22–5.81)
RDW: 14.1 % (ref 11.5–15.5)
WBC: 10.6 10*3/uL — ABNORMAL HIGH (ref 4.0–10.5)
nRBC: 0 % (ref 0.0–0.2)

## 2019-07-22 LAB — RETICULOCYTES
Immature Retic Fract: 17.1 % — ABNORMAL HIGH (ref 2.3–15.9)
RBC.: 4.46 MIL/uL (ref 4.22–5.81)
Retic Count, Absolute: 63.3 10*3/uL (ref 19.0–186.0)
Retic Ct Pct: 1.4 % (ref 0.4–3.1)

## 2019-07-22 LAB — TYPE AND SCREEN
ABO/RH(D): O POS
Antibody Screen: NEGATIVE

## 2019-07-22 LAB — CBC
HCT: 36.7 % — ABNORMAL LOW (ref 39.0–52.0)
Hemoglobin: 12.4 g/dL — ABNORMAL LOW (ref 13.0–17.0)
MCH: 27.8 pg (ref 26.0–34.0)
MCHC: 33.8 g/dL (ref 30.0–36.0)
MCV: 82.3 fL (ref 80.0–100.0)
Platelets: 192 10*3/uL (ref 150–400)
RBC: 4.46 MIL/uL (ref 4.22–5.81)
RDW: 13.8 % (ref 11.5–15.5)
WBC: 10.6 10*3/uL — ABNORMAL HIGH (ref 4.0–10.5)
nRBC: 0 % (ref 0.0–0.2)

## 2019-07-22 MED ORDER — POTASSIUM CHLORIDE CRYS ER 20 MEQ PO TBCR
40.0000 meq | EXTENDED_RELEASE_TABLET | Freq: Once | ORAL | Status: AC
  Administered 2019-07-22: 10:00:00 40 meq via ORAL
  Filled 2019-07-22: qty 2

## 2019-07-22 NOTE — Evaluation (Signed)
Physical Therapy Evaluation Patient Details Name: Philip Moreno MRN: [id number] DOB: [birth year] Today's Date: 07/22/2019   History of Present Illness  Pt is a 78 y.o. male presenting to hospital 12/18 from ENT clinic d/t concern for intermittent nose bleed and htn; pt also on eliquis.  Per chart, pt with episode of bradycardia and syncopal episode at ENT office.  CT of head and MRI of brain negative for acute intracranial abnormality; does show active bleeding maxillary sinus both sides.  PMH includes prostate CA, DVT, PE, TIA, memory loss, a-fib, vascular dementia, seizure.  Clinical Impression  Prior to hospital admission, pt reports being ambulatory (no AD) and lives with his wife.  During session pt reported that he has dementia and had difficulty remembering things.  Currently pt is mod assist semi-supine to sit; min assist with transfers (to stand and transfer to recliner); and CGA to walk 3 feet with RW recliner back to bed.  Initially pt sat up long-sitting in bed but reporting his head did not feel right so pt assisted with laying back down in bed but pt requesting to try to sit up again; 2nd trial sitting up pt reporting his head felt better and that he could get to the chair.  Pt unsteady taking steps bed to recliner requiring min assist for balance (no walker use).  Mild bloody drainage noted coming from pt's nose after transfer to chair (nurse notified and came to assess).  Nurse reporting this was expected drainage but recommended pt go back to bed (for better positioning).  Pt able to walk a few feet recliner to bed with walker and CGA (improved balance/stability noted with walker use) and pt assisted back to bed (nurse present and aware of mild bloody drainage coming from pt's nose again).  Pt's BP 117/80 resting in bed beginning of session and 127/88 after transfer to chair.  Pt would benefit from skilled PT to address noted impairments and functional limitations (see below for any  additional details).  Upon hospital discharge, anticipate pt would benefit from HHPT and 24/7 assist with functional mobility (with use of RW) for safety.    Follow Up Recommendations Home health PT;Supervision/Assistance - 24 hour    Equipment Recommendations  Rolling walker with 5" wheels;3in1 (PT)    Recommendations for Other Services       Precautions / Restrictions Precautions Precautions: Fall Precaution Comments: epistaxis precautions (ex: no bending forward) Restrictions Weight Bearing Restrictions: No      Mobility  Bed Mobility Overal bed mobility: Needs Assistance Bed Mobility: Supine to Sit;Sit to Supine     Supine to sit: Mod assist;HOB elevated Sit to supine: Min guard;HOB elevated   General bed mobility comments: mod assist for trunk semi-supine to long sitting in bed 1st trial (pt reporting not feeling right in head so pt layed back down in bed with assist of therapist for safety); 2nd trial pt able to sit up onto edge of bed with initial assist for trunk (pt reporting head felt better); sit to supine CGA for safety provided  Transfers Overall transfer level: Needs assistance Equipment used: None;Rolling walker (2 wheeled) Transfers: Sit to/from [facility] Sit to Stand: Min assist Stand pivot transfers: Min assist       General transfer comment: standing from bed pt requiring min assist to stand and then min assist to take steps bed to recliner (pt appearing unsteady requiring assist for balance); min assist to stand from recliner  Ambulation/Gait Ambulation/Gait assistance: Min guard Gait  Distance (Feet): 3 Feet(recliner to bed) Assistive device: Rolling walker (2 wheeled)   Gait velocity: decreased   General Gait Details: improved steadiness noted using walker  Stairs            Wheelchair Mobility    Modified Rankin (Stroke Patients Only)       Balance Overall balance assessment: Needs assistance Sitting-balance  support: No upper extremity supported;Feet supported Sitting balance-Leahy Scale: Good Sitting balance - Comments: steady sitting reaching within BOS   Standing balance support: Single extremity supported Standing balance-Leahy Scale: Fair Standing balance comment: pt requiring at least single UE support for static standing balance                             Pertinent Vitals/Pain Pain Assessment: No/denies pain  HR WFL during session's activities.    Home Living Family/patient expects to be discharged to:: Private residence Living Arrangements: Spouse/significant other Available Help at Discharge: Family;Available 24 hours/day Type of Home: House Home Access: (pt reports having stairs to enter home but unsure how many or railing set up)     Home Layout: One level Home Equipment: Walker - 2 wheels      Prior Function Level of Independence: Independent         Comments: Pt reports ambulating without any AD but does have RW at home if needed; no recent falls per pt report.     Hand Dominance        Extremity/Trunk Assessment   Upper Extremity Assessment Upper Extremity Assessment: Generalized weakness    Lower Extremity Assessment Lower Extremity Assessment: Generalized weakness       Communication   Communication: No difficulties  Cognition Arousal/Alertness: Awake/alert Behavior During Therapy: Flat affect Overall Cognitive Status: No family/caregiver present to determine baseline cognitive functioning                                 General Comments: Oriented to person, place, general time and situation.  Pt reports having "dementia" which caused him to have difficulty describing things during session (like how he was feeling).      General Comments   Nursing cleared pt for participation in physical therapy and nurse reports they were planning on going in soon to get pt up anyway.  Pt agreeable to PT session.    Exercises   Transfer training   Assessment/Plan    PT Assessment Patient needs continued PT services  PT Problem List Decreased strength;Decreased activity tolerance;Decreased balance;Decreased mobility;Decreased knowledge of use of DME;Decreased knowledge of precautions       PT Treatment Interventions DME instruction;Gait training;Stair training;Functional mobility training;Therapeutic activities;Therapeutic exercise;Balance training;Patient/family education    PT Goals (Current goals can be found in the Care Plan section)  Acute Rehab PT Goals Patient Stated Goal: to go home; have nose stop bleeding PT Goal Formulation: With patient Time For Goal Achievement: 08/05/19 Potential to Achieve Goals: Fair    Frequency Min 2X/week   Barriers to discharge        Co-evaluation               AM-PAC PT "6 Clicks" Mobility  Outcome Measure Help needed turning from your back to your side while in a flat bed without using bedrails?: None Help needed moving from lying on your back to sitting on the side of a flat bed without using bedrails?: A Lot  Help needed moving to and from a bed to a chair (including a wheelchair)?: A Little Help needed standing up from a chair using your arms (e.g., wheelchair or bedside chair)?: A Little Help needed to walk in hospital room?: A Little Help needed climbing 3-5 steps with a railing? : A Little 6 Click Score: 18    End of Session Equipment Utilized During Treatment: Gait belt Activity Tolerance: No increased pain Patient left: in bed;with call bell/phone within reach;with bed alarm set Nurse Communication: Mobility status;Precautions;Other (comment)(bleeding noted from pt's nose with activity) PT Visit Diagnosis: Unsteadiness on feet (R26.81);Other abnormalities of gait and mobility (R26.89);Muscle weakness (generalized) (M62.81);Difficulty in walking, not elsewhere classified (R26.2)    Time: [id number] PT Time Calculation (min) (ACUTE ONLY): 27  min   Charges:   PT Evaluation $PT Eval Low Complexity: 1 Low PT Treatments $Therapeutic Activity: 8-22 mins        Leitha Bleak, PT 07/22/19, 5:18 PM

## 2019-07-22 NOTE — Progress Notes (Signed)
The patient refused morning blood glucose checks x3. MD was notified.

## 2019-07-22 NOTE — Care Management Obs Status (Signed)
MEDICARE OBSERVATION STATUS NOTIFICATION   Patient Details  Name: Philip Moreno MRN: [id number] Date of Birth: 1941/07/05   Medicare Observation Status Notification Given:  Yes    Marshell Garfinkel, RN 07/22/2019, 9:27 AM

## 2019-07-22 NOTE — Progress Notes (Signed)
[facility] Progress Note  Patient: Philip Moreno [id number]   PCP: Olin Hauser, DO DOB: March 09, 1941   DOA: 07/21/2019   DOS: 07/22/2019   Date of Service: the patient was seen and examined on 07/22/2019  Chief Complaint  Patient presents with  . Epistaxis  Brief hospital course: Pt. with PMH of prostate cancer, dementia, GERD, PE on anticoagulation although PE was back in 2017; presented with complain of nosebleeding, was found to have anemia secondary to nosebleeding.  Currently further plan is monitor H&H.  Subjective: Denies any acute complaint no nausea no vomiting but has occasional episodes of bleeding but not significant amount.  Assessment and Plan: Scheduled Meds: . amLODipine  5 mg Oral Daily  . levETIRAcetam  1,000 mg Oral BID  . levothyroxine  50 mcg Oral Daily  . lisinopril  30 mg Oral Daily  . metoprolol succinate  100 mg Oral Daily  . mirtazapine  15 mg Oral QHS  . sodium chloride flush  3 mL Intravenous Q12H   Continuous Infusions: PRN Meds: acetaminophen **OR** acetaminophen, ondansetron **OR** ondansetron (ZOFRAN) IV, oxymetazoline  1.  Epistaxis Acute blood loss anemia Dr. Richardson Landry abdominal pain are better with the patient. Patient bilaterally currently holding off on Eliquis #monitor. MRI does not show any acute abnormality. Hemoglobin dropped from 16-12.4.  Monitor H&H.  Transfuse for hemoglobin less than 10.  2.  History of PE. Currently holding off on Eliquis given the patient's recurrence  3.  Acute blood loss anemia. H&H dropped from 15-14. We will continue to monitor. Currently asymptomatic.  4.  History of prostate cancer. Monitor  5. Pt being followed by [facility] for palliative care, not on hospice.    Diet: Cardiac diet  DVT Prophylaxis: SCD, pharmacological prophylaxis contraindicated due to Epistaxis   Advance goals of care discussion: Full code  Family Communication: family was present at bedside, at  the time of interview. The pt provided permission to discuss medical plan with the family. Opportunity was given to ask question and all questions were answered satisfactorily.   Disposition:  Discharge to be determined.  Consultants: none Procedures: none  Antibiotics: Anti-infectives (From admission, onward)   None       Objective: Physical Exam: Vitals:   07/22/19 0027 07/22/19 0620 07/22/19 0744 07/22/19 1222  BP: (!) 142/102 (!) 142/99 (!) 117/94 127/90  Pulse: 77 68 78 66  Resp: (!) 24 (!) 24 18 18   Temp: 98.2 F (36.8 C) (!) 97.4 F (36.3 C) (!) 97.5 F (36.4 C) (!) 97.3 F (36.3 C)  TempSrc: Oral Oral Oral Oral  SpO2: 98% 96% 98% 98%  Weight:      Height:        Intake/Output Summary (Last 24 hours) at 07/22/2019 1952 Last data filed at 07/22/2019 1700 Gross per 24 hour  Intake 0 ml  Output 650 ml  Net -650 ml   Filed Weights   07/21/19 1530  Weight: 86 kg   General: alert and oriented to time, place, and person. Appear in mild distress, affect appropriate Eyes: PERRL, Conjunctiva normal ENT: Oral Mucosa Clear, moist  Neck: no JVD, no Abnormal Mass Or lumps Cardiovascular: S1 and S2 Present, no Murmur,  Respiratory: good respiratory effort, Bilateral Air entry equal and Decreased, no signs of accessory muscle use, Clear to Auscultation, no Crackles, no wheezes Abdomen: Bowel Sound present, Soft and no tenderness, no hernia Skin: no rashes  Extremities: no Pedal edema, no calf tenderness Neurologic: without any new focal findings  Gait not checked due to patient safety concerns  Data Reviewed: I have personally reviewed and interpreted daily labs, tele strips, imagings as discussed above. I reviewed all nursing notes, pharmacy notes, vitals, pertinent old records I have discussed plan of care as described above with RN and patient/family.  CBC: Recent Labs  Lab 07/17/19 1643 07/20/19 2030 07/21/19 1546 07/22/19 0606 07/22/19 0842  WBC 8.6 7.8  9.1 10.6* 10.6*  NEUTROABS  --  4.5 6.5  --  6.8  HGB 15.7 15.1 14.1 12.4* 12.2*  HCT 46.8 46.9 43.3 36.7* 37.0*  MCV 82.1 86.4 86.1 82.3 83.0  PLT 211 214 222 192 [id number]   Basic Metabolic Panel: Recent Labs  Lab 07/20/19 2030 07/22/19 0606  NA 142 142  K 3.7 3.3*  CL 107 109  CO2 24 22  GLUCOSE 96 100*  BUN 16 25*  CREATININE 1.33* 1.18  CALCIUM 9.0 8.7*    Liver Function Tests: Recent Labs  Lab 07/20/19 2030 07/22/19 0606  AST 42* 27  ALT 50* 31  ALKPHOS 47 33*  BILITOT 0.7 0.8  PROT 8.3* 6.5  ALBUMIN 4.1 3.3*   No results for input(s): LIPASE, AMYLASE in the last 168 hours. No results for input(s): AMMONIA in the last 168 hours. Coagulation Profile: Recent Labs  Lab 07/20/19 2030  INR 1.1   Cardiac Enzymes: No results for input(s): CKTOTAL, CKMB, CKMBINDEX, TROPONINI in the last 168 hours. BNP (last 3 results) No results for input(s): PROBNP in the last 8760 hours. CBG: No results for input(s): GLUCAP in the last 168 hours. Studies: No results found.   Time spent: 35 minutes  Author: Berle Mull, MD [facility] 07/22/2019 7:52 PM  To reach On-call, see care teams to locate the attending and reach out to them via [email or web address]. If 7PM-7AM, please contact night-coverage If you still have difficulty reaching the attending provider, please page the [facility] (Director on Call) for [facility] on amion for assistance.

## 2019-07-23 DIAGNOSIS — E876 Hypokalemia: Secondary | ICD-10-CM | POA: Diagnosis not present

## 2019-07-23 DIAGNOSIS — Z79899 Other long term (current) drug therapy: Secondary | ICD-10-CM | POA: Diagnosis not present

## 2019-07-23 DIAGNOSIS — G309 Alzheimer's disease, unspecified: Secondary | ICD-10-CM | POA: Diagnosis not present

## 2019-07-23 DIAGNOSIS — E039 Hypothyroidism, unspecified: Secondary | ICD-10-CM | POA: Diagnosis not present

## 2019-07-23 DIAGNOSIS — Z5181 Encounter for therapeutic drug level monitoring: Secondary | ICD-10-CM | POA: Diagnosis not present

## 2019-07-23 DIAGNOSIS — Z86718 Personal history of other venous thrombosis and embolism: Secondary | ICD-10-CM | POA: Diagnosis not present

## 2019-07-23 DIAGNOSIS — R040 Epistaxis: Secondary | ICD-10-CM | POA: Diagnosis not present

## 2019-07-23 DIAGNOSIS — I129 Hypertensive chronic kidney disease with stage 1 through stage 4 chronic kidney disease, or unspecified chronic kidney disease: Secondary | ICD-10-CM | POA: Diagnosis not present

## 2019-07-23 DIAGNOSIS — Z86711 Personal history of pulmonary embolism: Secondary | ICD-10-CM | POA: Diagnosis not present

## 2019-07-23 DIAGNOSIS — Z7901 Long term (current) use of anticoagulants: Secondary | ICD-10-CM | POA: Diagnosis not present

## 2019-07-23 DIAGNOSIS — N183 Chronic kidney disease, stage 3 unspecified: Secondary | ICD-10-CM | POA: Diagnosis not present

## 2019-07-23 DIAGNOSIS — M1990 Unspecified osteoarthritis, unspecified site: Secondary | ICD-10-CM | POA: Diagnosis not present

## 2019-07-23 DIAGNOSIS — N189 Chronic kidney disease, unspecified: Secondary | ICD-10-CM | POA: Diagnosis not present

## 2019-07-23 DIAGNOSIS — R9431 Abnormal electrocardiogram [ECG] [EKG]: Secondary | ICD-10-CM | POA: Diagnosis not present

## 2019-07-23 DIAGNOSIS — Z8673 Personal history of transient ischemic attack (TIA), and cerebral infarction without residual deficits: Secondary | ICD-10-CM | POA: Diagnosis not present

## 2019-07-23 DIAGNOSIS — M109 Gout, unspecified: Secondary | ICD-10-CM | POA: Diagnosis not present

## 2019-07-23 DIAGNOSIS — I4891 Unspecified atrial fibrillation: Secondary | ICD-10-CM | POA: Diagnosis not present

## 2019-07-23 DIAGNOSIS — Z801 Family history of malignant neoplasm of trachea, bronchus and lung: Secondary | ICD-10-CM | POA: Diagnosis not present

## 2019-07-23 DIAGNOSIS — D72829 Elevated white blood cell count, unspecified: Secondary | ICD-10-CM | POA: Diagnosis not present

## 2019-07-23 DIAGNOSIS — I444 Left anterior fascicular block: Secondary | ICD-10-CM | POA: Diagnosis not present

## 2019-07-23 LAB — BASIC METABOLIC PANEL
Anion gap: 9 (ref 5–15)
BUN: 17 mg/dL (ref 8–23)
CO2: 21 mmol/L — ABNORMAL LOW (ref 22–32)
Calcium: 8.2 mg/dL — ABNORMAL LOW (ref 8.9–10.3)
Chloride: 108 mmol/L (ref 98–111)
Creatinine, Ser: 1.25 mg/dL — ABNORMAL HIGH (ref 0.61–1.24)
GFR calc Af Amer: >60 mL/min (ref >60)
GFR calc non Af Amer: 56 mL/min — ABNORMAL LOW (ref >60)
Glucose, Bld: 108 mg/dL — ABNORMAL HIGH (ref 70–99)
Potassium: 3.5 mmol/L (ref 3.5–5.1)
Sodium: 138 mmol/L (ref 135–145)

## 2019-07-23 LAB — CBC WITH DIFFERENTIAL/PLATELET
Abs Immature Granulocytes: 0.05 10*3/uL (ref 0.00–0.07)
Basophils Absolute: 0 10*3/uL (ref 0.0–0.1)
Basophils Relative: 0 %
Eosinophils Absolute: 0.3 10*3/uL (ref 0.0–0.5)
Eosinophils Relative: 3 %
HCT: 36.7 % — ABNORMAL LOW (ref 39.0–52.0)
Hemoglobin: 11.9 g/dL — ABNORMAL LOW (ref 13.0–17.0)
Immature Granulocytes: 1 %
Lymphocytes Relative: 21 %
Lymphs Abs: 2.2 10*3/uL (ref 0.7–4.0)
MCH: 27.9 pg (ref 26.0–34.0)
MCHC: 32.4 g/dL (ref 30.0–36.0)
MCV: 85.9 fL (ref 80.0–100.0)
Monocytes Absolute: 0.9 10*3/uL (ref 0.1–1.0)
Monocytes Relative: 9 %
Neutro Abs: 6.8 10*3/uL (ref 1.7–7.7)
Neutrophils Relative %: 66 %
Platelets: 203 10*3/uL (ref 150–400)
RBC: 4.27 MIL/uL (ref 4.22–5.81)
RDW: 14 % (ref 11.5–15.5)
WBC: 10.3 10*3/uL (ref 4.0–10.5)
nRBC: 0 % (ref 0.0–0.2)

## 2019-07-23 MED ORDER — OXYMETAZOLINE HCL 0.05 % NA SOLN: 1.0000 | Freq: Two times a day (BID) | NASAL | 0 refills | Status: AC | PRN

## 2019-07-23 NOTE — Progress Notes (Signed)
Patient discharged home before assessment could be completed. Phone call to patient's home to follow up on discharge needs. Spoke with patient's spouse who confirmed that patient has a bedside commode and and a rollator walker. This Education officer, museum discussed difficulty in identifying a home health agency that would take patient's insurance. Discussed the possibility of outpatient PT. Patient's spouse agrees with this plan and states that she will request a referral from patient's primary care doctor during their next follow up appointment scheduled on 07/24/19.   [place], [place] [facility] [phone]

## 2019-07-23 NOTE — Discharge Instructions (Signed)
Nosebleed, Adult A nosebleed is when blood comes out of the nose. Nosebleeds are common. Usually, they are not a sign of a serious condition. Nosebleeds can happen if a small blood vessel in your nose starts to bleed or if the lining of your nose (mucous membrane) cracks. They are commonly caused by:  Allergies.  Colds.  Picking your nose.  Blowing your nose too hard.  An injury from sticking an object into your nose or getting hit in the nose.  Dry or cold air. Less common causes of nosebleeds include:  Toxic fumes.  Something abnormal in the nose or in the air-filled spaces in the bones of the face (sinuses).  Growths in the nose, such as polyps.  Medicines or conditions that cause blood to clot slowly.  Certain illnesses or procedures that irritate or dry out the nasal passages. Follow these instructions at home: When you have a nosebleed:   Sit down and tilt your head slightly forward.  Use a clean towel or tissue to pinch your nostrils under the bony part of your nose. After 10 minutes, let go of your nose and see if bleeding starts again. Do not release pressure before that time. If there is still bleeding, repeat the pinching and holding for 10 minutes until the bleeding stops.  Do not place tissues or gauze in the nose to stop bleeding.  Avoid lying down and avoid tilting your head backward. That may make blood collect in the throat and cause gagging or coughing.  Use a nasal spray decongestant to help with a nosebleed as told by your health care provider.  Do not use petroleum jelly or mineral oil in your nose. It can drip into your lungs. After a nosebleed:  Avoid blowing your nose or sniffing for a number of hours.  Avoid straining, lifting, or bending at the waist for several days. You may resume other normal activities as you are able.  Use saline spray or a humidifier as told by your health care provider.  Aspirinand blood thinners make bleeding more  likely. If you are prescribed these medicines and you suffer from nosebleeds: ? Ask your health care provider if you should stop taking the medicines or if you should adjust the dose. ? Do not stop taking medicines that your health care provider has recommended unless told by your health care provider.  If your nosebleed was caused by dry mucous membranes, use over-the-counter saline nasal spray or gel. This will keep the mucous membranes moist and allow them to heal. If you must use a lubricant: ? Choose one that is water-soluble. ? Use only as much as you need and use it only as often as needed. ? Do not lie down until several hours after you use it. Contact a health care provider if:  You have a fever.  You get nosebleeds often or more often than usual.  You bruise very easily.  You have a nosebleed from having something stuck in your nose.  You have bleeding in your mouth.  You vomit or cough up brown material.  You have a nosebleed after you start a new medicine. Get help right away if:  You have a nosebleed after a fall or a head injury.  Your nosebleed does not go away after 20 minutes.  You feel dizzy or weak.  You have unusual bleeding from other parts of your body.  You have unusual bruising on other parts of your body.  You become sweaty.  You   vomit blood. This information is not intended to replace advice given to you by your health care provider. Make sure you discuss any questions you have with your health care provider. Document Released: 04/29/2005 Document Revised: 10/19/2017 Document Reviewed: 02/04/2016 Elsevier Patient Education  2020 [employer] Inc[employer]  

## 2019-07-23 NOTE — Progress Notes (Signed)
Karle Starch to be D/C'd home with wife per MD order.  Discussed prescriptions and follow up appointments with the patient. Prescriptions given to patient, medication list explained in detail. Pt verbalized understanding.  Allergies as of 07/23/2019   No Known Allergies      Medication List     STOP taking these medications    apixaban 5 MG Tabs tablet Commonly known as: Eliquis       TAKE these medications    allopurinol 100 MG tablet Commonly known as: ZYLOPRIM Take 1 tablet by mouth once daily   amLODipine 5 MG tablet Commonly known as: NORVASC Take 1 tablet (5 mg total) by mouth daily.   clotrimazole-betamethasone cream Commonly known as: LOTRISONE Apply topically 2 (two) times daily. For up to 1-2 weeks, may re-use daily up to 1 week as needed.   colchicine 0.6 MG tablet Take only as needed for acute gout flare within 24 hours - take 2 tablets, then 1 hour later take 1 more tablet, then switch to 1 tab daily until resolve   Euthyrox 50 MCG tablet Generic drug: levothyroxine TAKE 1 TABLET BY MOUTH ONCE DAILY BEFORE BREAKFAST What changed: See the new instructions.   levETIRAcetam 1000 MG tablet Commonly known as: KEPPRA Take 1 tablet by mouth twice daily   lisinopril 30 MG tablet Commonly known as: ZESTRIL Take 1 tablet by mouth once daily   metoprolol succinate 100 MG 24 hr tablet Commonly known as: TOPROL-XL TAKE 1 TABLET BY MOUTH ONCE DAILY. TAKE WITH OR IMMEDIATELY FOLLOWING A MEAL. What changed: See the new instructions.   mirtazapine 15 MG disintegrating tablet Commonly known as: REMERON SOL-TAB Take 1 tablet (15 mg total) by mouth at bedtime.   oxymetazoline 0.05 % nasal spray Commonly known as: AFRIN Place 1 spray into both nostrils 2 (two) times daily as needed for congestion.               Durable Medical Equipment  (From admission, onward)           Start     Ordered   07/23/19 1226  For home use only DME Walker rolling   Once    Question:  Patient needs a walker to treat with the following condition  Answer:  Physical deconditioning   07/23/19 1225   07/23/19 1226  For home use only DME 3 n 1  Once     07/23/19 1225            Vitals:   07/23/19 0510 07/23/19 1221  BP: (!) 142/90 (!) 134/95  Pulse: 78 74  Resp: (!) 22   Temp: 98.2 F (36.8 C) 97.7 F (36.5 C)  SpO2: 95% 96%    Skin clean, dry and intact without evidence of skin break down, no evidence of skin tears noted. IV catheter discontinued intact. Site without signs and symptoms of complications. Dressing and pressure applied. Pt denies pain at this time. No complaints noted.  An After Visit Summary was printed and given to the patient. Patient escorted via [place], and D/C home via private auto.  [place] A Cherryl Babin

## 2019-07-24 ENCOUNTER — Telehealth: Payer: Self-pay

## 2019-07-24 ENCOUNTER — Ambulatory Visit: Payer: Medicare Other | Admitting: Family Medicine

## 2019-07-24 ENCOUNTER — Telehealth: Payer: Self-pay | Admitting: Primary Care

## 2019-07-24 DIAGNOSIS — I10 Essential (primary) hypertension: Secondary | ICD-10-CM | POA: Diagnosis not present

## 2019-07-24 DIAGNOSIS — Z86718 Personal history of other venous thrombosis and embolism: Secondary | ICD-10-CM | POA: Diagnosis not present

## 2019-07-24 DIAGNOSIS — G309 Alzheimer's disease, unspecified: Secondary | ICD-10-CM | POA: Diagnosis not present

## 2019-07-24 DIAGNOSIS — R040 Epistaxis: Secondary | ICD-10-CM | POA: Diagnosis not present

## 2019-07-24 NOTE — Telephone Encounter (Signed)
T/c to check on patient after hospitalization. No answer, message left.

## 2019-07-24 NOTE — Telephone Encounter (Signed)
I have made the 1st attempt to contact the patient or family member in charge, in order to follow up from recently being discharged from the hospital.   Patient scheduled for hospital follow up today. TCM eligible- patient discharged within 48 hours

## 2019-07-25 DIAGNOSIS — Z86718 Personal history of other venous thrombosis and embolism: Secondary | ICD-10-CM | POA: Diagnosis not present

## 2019-07-25 DIAGNOSIS — R040 Epistaxis: Secondary | ICD-10-CM | POA: Diagnosis not present

## 2019-07-25 DIAGNOSIS — G309 Alzheimer's disease, unspecified: Secondary | ICD-10-CM | POA: Diagnosis not present

## 2019-07-25 DIAGNOSIS — I10 Essential (primary) hypertension: Secondary | ICD-10-CM | POA: Diagnosis not present

## 2019-07-26 DIAGNOSIS — Z86718 Personal history of other venous thrombosis and embolism: Secondary | ICD-10-CM | POA: Diagnosis not present

## 2019-07-26 DIAGNOSIS — R040 Epistaxis: Secondary | ICD-10-CM | POA: Diagnosis not present

## 2019-07-26 DIAGNOSIS — G309 Alzheimer's disease, unspecified: Secondary | ICD-10-CM | POA: Diagnosis not present

## 2019-07-26 DIAGNOSIS — I10 Essential (primary) hypertension: Secondary | ICD-10-CM | POA: Diagnosis not present

## 2019-07-26 MED ORDER — NASAL SPRAY 0.05 % NA SOLN
2.00 | NASAL | Status: DC
Start: ? — End: 2019-07-26

## 2019-07-26 MED ORDER — SALINE NASAL SPRAY 0.65 % NA SOLN
1.00 | NASAL | Status: DC
Start: 2019-07-26 — End: 2019-07-26

## 2019-07-26 MED ORDER — ACETAMINOPHEN 325 MG PO TABS
650.00 | ORAL_TABLET | ORAL | Status: DC
Start: ? — End: 2019-07-26

## 2019-07-26 MED ORDER — LEVETIRACETAM 500 MG PO TABS
1000.00 | ORAL_TABLET | ORAL | Status: DC
Start: 2019-07-26 — End: 2019-07-26

## 2019-07-26 MED ORDER — AMLODIPINE BESYLATE 10 MG PO TABS
10.00 | ORAL_TABLET | ORAL | Status: DC
Start: 2019-07-27 — End: 2019-07-26

## 2019-07-26 MED ORDER — GENERIC EXTERNAL MEDICATION
30.00 | Status: DC
Start: 2019-07-27 — End: 2019-07-26

## 2019-07-26 MED ORDER — MELATONIN 3 MG PO TABS
3.00 | ORAL_TABLET | ORAL | Status: DC
Start: ? — End: 2019-07-26

## 2019-07-26 MED ORDER — LEVOTHYROXINE SODIUM 50 MCG PO TABS
50.00 | ORAL_TABLET | ORAL | Status: DC
Start: 2019-07-27 — End: 2019-07-26

## 2019-07-26 MED ORDER — GENERIC EXTERNAL MEDICATION
Status: DC
Start: ? — End: 2019-07-26

## 2019-07-26 MED ORDER — ALLOPURINOL 100 MG PO TABS
100.00 | ORAL_TABLET | ORAL | Status: DC
Start: 2019-07-27 — End: 2019-07-26

## 2019-07-26 MED ORDER — METOPROLOL SUCCINATE ER 25 MG PO TB24
25.00 | ORAL_TABLET | ORAL | Status: DC
Start: 2019-07-27 — End: 2019-07-26

## 2019-07-26 MED ORDER — SENNOSIDES-DOCUSATE SODIUM 8.6-50 MG PO TABS
2.00 | ORAL_TABLET | ORAL | Status: DC
Start: ? — End: 2019-07-26

## 2019-07-26 MED ORDER — LIDOCAINE HCL 1 % IJ SOLN
0.50 | INTRAMUSCULAR | Status: DC
Start: ? — End: 2019-07-26

## 2019-07-26 MED ORDER — MIRTAZAPINE 15 MG PO TBDP
15.00 | ORAL_TABLET | ORAL | Status: DC
Start: 2019-07-26 — End: 2019-07-26

## 2019-07-27 NOTE — Discharge Summary (Signed)
[facility] Discharge Summary   Patient: Philip Moreno [id number]   PCP: Olin Hauser, DO DOB: [birth year]   Date of admission: 07/21/2019   Date of discharge: 07/23/2019     Discharge Diagnoses:  Principal diagnosis Epistaxis Active Problems:   Epistaxis, recurrent   Admitted From: Home Disposition:  Home with home health  Recommendations for Outpatient Follow-up:  1. PCP: Follow-up with PCP in 1 week with repeat CBC.  Follow-up with ENT as recommended. 2. Follow up LABS/TEST: Repeat CBC  Follow-up Information    Olin Hauser, DO. Schedule an appointment as soon as possible for a visit in 1 week(s).   Specialty: Family Medicine Contact information: [place] [address] [phone]        Clyde Canterbury, MD. Call in 1 week(s).   Specialty: Otolaryngology Contact information: [address] [address] [phone]          Diet recommendation: Cardiac diet  Activity: The patient is advised to gradually reintroduce usual activities,as tolerated  Discharge Condition: good  Code Status: Full code   History of present illness: As per the H and P dictated on admission, " Philip Moreno is a 78 y.o. male with Past medical history of prostate cancer, dementia, GERD, PE on anticoagulation although PE was back in 2017. Patient presents with complaints of frequent episodes of nosebleeding. Patient was seen in the ER.  Patient was seen in the ER for epistaxis patient is currently on anticoagulation with Eliquis. After initial packing patient was discharged to home and no follow-up with ENT. When the patient was in the ENT they were not able to stop patient's nose bleeding. Along with that patient actually also had a high blood pressure.  There was an episode of bradycardia and had a syncopal event at the ENT office as well. At the time of my evaluation patient denies any acute complaint no nausea  no vomiting no fever no chills.  Patient does have some nasal discomfort due to packing.  No focal deficit.  No blood in the stool or anywhere."  Hospital Course:  Summary of his active problems in the hospital is as following. 1.Epistaxis Acute blood loss anemia Dr. Richardson Landry abdominal pain are better with the patient. Patient bilaterally currently holding off on Eliquis #monitor. MRI does not show any acute abnormality. Hemoglobin dropped from 16-12. Remained stable. No further bleeding.  Monitor.  2.History of PE. Currently holding off on Eliquis given the patient's recurrence Recommending holding the Eliquis for now until seen by ENT.  3.Acute blood loss anemia. H&H dropped from 16-12 but then remained stable after the. No active bleeding reported by the family last 24 hours. No melena reported overnight.  Had a brown color bowel movement. Recommending follow-up with PCP with repeat CBC in 1 week.  Also follow-up with ENT.  4.History of prostate cancer. Monitor  5. Pt being followed by [facility] for palliative care, not on hospice.  Patient was seen by physical therapy, who recommended Home health, which was arranged. On the day of the discharge the patient's vitals were stable, and no other acute medical condition were reported by patient. the patient was felt safe to be discharge at Home with Home health.  Consultants: ENT was consulted by EDP Procedures: None  DISCHARGE MEDICATION: Allergies as of 07/23/2019   No Known Allergies     Medication List    STOP taking these medications   apixaban 5 MG Tabs tablet  Commonly known as: Eliquis     TAKE these medications   allopurinol 100 MG tablet Commonly known as: ZYLOPRIM Take 1 tablet by mouth once daily   amLODipine 5 MG tablet Commonly known as: NORVASC Take 1 tablet (5 mg total) by mouth daily.   clotrimazole-betamethasone cream Commonly known as: LOTRISONE Apply topically 2 (two) times daily.  For up to 1-2 weeks, may re-use daily up to 1 week as needed.   colchicine 0.6 MG tablet Take only as needed for acute gout flare within 24 hours - take 2 tablets, then 1 hour later take 1 more tablet, then switch to 1 tab daily until resolve   Euthyrox 50 MCG tablet Generic drug: levothyroxine TAKE 1 TABLET BY MOUTH ONCE DAILY BEFORE BREAKFAST What changed: See the new instructions.   levETIRAcetam 1000 MG tablet Commonly known as: KEPPRA Take 1 tablet by mouth twice daily   lisinopril 30 MG tablet Commonly known as: ZESTRIL Take 1 tablet by mouth once daily   metoprolol succinate 100 MG 24 hr tablet Commonly known as: TOPROL-XL TAKE 1 TABLET BY MOUTH ONCE DAILY. TAKE WITH OR IMMEDIATELY FOLLOWING A MEAL. What changed: See the new instructions.   mirtazapine 15 MG disintegrating tablet Commonly known as: REMERON SOL-TAB Take 1 tablet (15 mg total) by mouth at bedtime.   oxymetazoline 0.05 % nasal spray Commonly known as: AFRIN Place 1 spray into both nostrils 2 (two) times daily as needed for congestion.      No Known Allergies Discharge Instructions    Diet - low sodium heart healthy   Complete by: As directed    Increase activity slowly   Complete by: As directed      Discharge Exam: Filed Weights   07/21/19 1530 07/23/19 0510  Weight: 86 kg 91 kg   Vitals:   07/23/19 0510 07/23/19 1221  BP: (!) 142/90 (!) 134/95  Pulse: 78 74  Resp: (!) 22   Temp: 98.2 F (36.8 C) 97.7 F (36.5 C)  SpO2: 95% 96%   General: Appear in no distress, no Rash; Oral Mucosa Clear, moist. no Abnormal Mass Or lumps Cardiovascular: S1 and S2 Present, no Murmur, Respiratory: normal respiratory effort, Bilateral Air entry present and Clear to Auscultation, no Crackles, no wheezes Abdomen: Bowel Sound present, Soft and no tenderness, no hernia Extremities: no Pedal edema, no calf tenderness Neurology: alert and oriented to time, place, and person affect appropriate.  The results  of significant diagnostics from this hospitalization (including imaging, microbiology, ancillary and laboratory) are listed below for reference.    Significant Diagnostic Studies: CT HEAD WO CONTRAST  Result Date: 07/20/2019 CLINICAL DATA:  Confusion EXAM: CT HEAD WITHOUT CONTRAST TECHNIQUE: Contiguous axial images were obtained from the base of the skull through the vertex without intravenous contrast. COMPARISON:  None. FINDINGS: Brain: There is atrophy and chronic small vessel disease changes. No acute intracranial abnormality. Specifically, no hemorrhage, hydrocephalus, mass lesion, acute infarction, or significant intracranial injury. Vascular: No hyperdense vessel or unexpected calcification. Skull: No acute calvarial abnormality. Sinuses/Orbits: Visualized paranasal sinuses and mastoids clear. Orbital soft tissues unremarkable. Other: None IMPRESSION: Atrophy, chronic microvascular disease. No acute intracranial abnormality. Electronically Signed   By: Rolm Baptise M.D.   On: 07/20/2019 20:55   MR BRAIN WO CONTRAST  Result Date: 07/21/2019 CLINICAL DATA:  Hypertension.  Acute onset of confusion.  Epistaxis. EXAM: MRI HEAD WITHOUT CONTRAST TECHNIQUE: Multiplanar, multiecho pulse sequences of the brain and surrounding structures were obtained without intravenous contrast. COMPARISON:  CT head without contrast 07/20/2019. MR head without and with contrast 12/20/2015 FINDINGS: Brain: Diffusion-weighted images demonstrate no acute or subacute infarction. Multiple remote lacunar infarcts are present throughout the cerebellum bilaterally. A remote nonhemorrhagic lacunar infarct is present in the left thalamus. Remote ischemic changes are present in the right thalamus. Confluent periventricular T2 hyperintensities are present bilaterally. There is thinning of the corpus callosum. Marked atrophy is noted. Remote cortical infarct is present in the anterior frontal lobes bilaterally. The ventricles are  proportionate to the degree of atrophy. No significant extraaxial fluid collection is present. Vascular: Flow is present in the major intracranial arteries. Skull and upper cervical spine: The craniocervical junction is normal. Upper cervical spine is within normal limits. Marrow signal is unremarkable. Sinuses/Orbits: Small fluid levels are present in the maxillary sinuses bilaterally. There is associated T1 shortening, likely hemorrhage. Additional nonhemorrhagic fluid is present in the nasopharynx. Mastoid air cells are clear. Remaining paranasal sinuses are clear. The globes and orbits are within normal limits. IMPRESSION: 1. No acute intracranial abnormality. 2. 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 3. Advanced atrophy and diffuse white matter disease. This likely reflects the sequela of chronic microvascular ischemia. 4. Fluid levels in the maxillary sinuses bilaterally compatible with hemorrhage. 5. Asymmetric congestion of the right nasal cavity and additional nasopharyngeal fluid, less likely blood. Electronically Signed   By: San Morelle M.D.   On: 07/21/2019 05:06   DG Chest Port 1 View  Result Date: 07/21/2019 CLINICAL DATA:  Shortness of breath EXAM: PORTABLE CHEST 1 VIEW COMPARISON:  06/16/2017 FINDINGS: Heart is normal size. Tortuosity of the thoracic aorta. Lungs clear. No effusions or acute bony abnormality. IMPRESSION: No active disease. Electronically Signed   By: Rolm Baptise M.D.   On: 07/21/2019 18:07    Microbiology: No results found for this or any previous visit (from the past 240 hour(s)).   Labs: CBC: Recent Labs  Lab 07/20/19 2030 07/21/19 1546 07/22/19 0606 07/22/19 0842 07/23/19 0916  WBC 7.8 9.1 10.6* 10.6* 10.3  NEUTROABS 4.5 6.5  --  6.8 6.8  HGB 15.1 14.1 12.4* 12.2* 11.9*  HCT 46.9 43.3 36.7* 37.0* 36.7*  MCV 86.4 86.1 82.3 83.0 85.9  PLT 214 222 192 196 [id number]   Basic Metabolic Panel: Recent Labs  Lab  07/20/19 2030 07/22/19 0606 07/23/19 0914  NA 142 142 138  K 3.7 3.3* 3.5  CL 107 109 108  CO2 24 22 21*  GLUCOSE 96 100* 108*  BUN 16 25* 17  CREATININE 1.33* 1.18 1.25*  CALCIUM 9.0 8.7* 8.2*   Liver Function Tests: Recent Labs  Lab 07/20/19 2030 07/22/19 0606  AST 42* 27  ALT 50* 31  ALKPHOS 47 33*  BILITOT 0.7 0.8  PROT 8.3* 6.5  ALBUMIN 4.1 3.3*   No results for input(s): LIPASE, AMYLASE in the last 168 hours. No results for input(s): AMMONIA in the last 168 hours. Cardiac Enzymes: No results for input(s): CKTOTAL, CKMB, CKMBINDEX, TROPONINI in the last 168 hours. BNP (last 3 results) No results for input(s): BNP in the last 8760 hours. CBG: No results for input(s): GLUCAP in the last 168 hours.  Time spent: 35 minutes  Signed:  Berle Mull  [facility] 07/23/2019 4:58 PM

## 2019-07-28 ENCOUNTER — Emergency Department
Admission: EM | Admit: 2019-07-28 | Discharge: 2019-08-04 | Disposition: E | Payer: Medicare Other | Attending: Student in an Organized Health Care Education/Training Program | Admitting: Student in an Organized Health Care Education/Training Program

## 2019-07-28 DIAGNOSIS — R404 Transient alteration of awareness: Secondary | ICD-10-CM | POA: Diagnosis not present

## 2019-07-28 DIAGNOSIS — Z743 Need for continuous supervision: Secondary | ICD-10-CM | POA: Diagnosis not present

## 2019-07-28 DIAGNOSIS — G309 Alzheimer's disease, unspecified: Secondary | ICD-10-CM | POA: Diagnosis not present

## 2019-07-28 DIAGNOSIS — I499 Cardiac arrhythmia, unspecified: Secondary | ICD-10-CM | POA: Diagnosis not present

## 2019-07-28 DIAGNOSIS — R092 Respiratory arrest: Secondary | ICD-10-CM | POA: Diagnosis not present

## 2019-07-28 DIAGNOSIS — R569 Unspecified convulsions: Secondary | ICD-10-CM | POA: Diagnosis not present

## 2019-07-28 DIAGNOSIS — I469 Cardiac arrest, cause unspecified: Secondary | ICD-10-CM | POA: Insufficient documentation

## 2019-07-28 NOTE — ED Provider Notes (Signed)
[facility] Emergency Department Provider Note    First MD Initiated Contact with Patient 07/18/2019 2319     (approximate)  I have reviewed the triage vital signs and the nursing notes.   HISTORY  Chief Complaint CPR in progress   Level V Caveat:  CPR in progress    HPI Philip Moreno is a 78 y.o. male presents via EMS with CPR in progress ventilations provided by BVM due to witnessed seizure and then arrest.  EMS reports  patient had witnessed seizure in setting of reported seizure disorder and then appeared to have an aspiration event and went unresponsive.  Patient was found to be in PEA.  Was given 3 rounds of epi.    No past medical history on file. No family history on file.  There are no problems to display for this patient.     Prior to Admission medications   Not on File    Allergies Patient has no allergy information on record.    Social History Social History   Tobacco Use  . Smoking status: Not on file  Substance Use Topics  . Alcohol use: Not on file  . Drug use: Not on file    Review of Systems Patient denies headaches, rhinorrhea, blurry vision, numbness, shortness of breath, chest pain, edema, cough, abdominal pain, nausea, vomiting, diarrhea, dysuria, fevers, rashes or hallucinations unless otherwise stated above in HPI. ____________________________________________   PHYSICAL EXAM:  VITAL SIGNS: There were no vitals filed for this visit.  Constitutional: GCS 3.  cpr in progress Eyes: Conjunctivae are normal.   Pupils midpoint Head: Atraumatic. Nose: No congestion/rhinnorhea.  No bleeding Mouth/Throat: gastric secretions suctioned from oropharynx  Neck: No stridor. Painless ROM.  Cardiovascular: pulseless Respiratory: no spontaneous respirations Gastrointestinal: Soft. No distention.  Musculoskeletal: no edema.   Neurologic: gcs 3 Skin:  Skin is cool, dry Psychiatric: unable to  assess____________________________________________   LABS (all labs ordered are listed, but only abnormal results are displayed)  No results found for this or any previous visit (from the past 24 hour(s)). ____________________________________________  EKG____________________________________________  RADIOLOGY    EMERGENCY DEPARTMENT [place] CARDIAC EXAM "Study: Limited Ultrasound of the Heart and Pericardium"  INDICATIONS:Cardiac arrest Multiple views of the heart and pericardium were obtained in real-time with a multi-frequency probe.  PERFORMED [id number] IMAGES ARCHIVED?: No LIMITATIONS:  None VIEWS USED: Subcostal 4 chamber, Apical 4 chamber  and Inferior Vena Cava INTERPRETATION: Cardiac activity absent, Pericardial effusioin absent and No cardiac activity   ____________________________________________   PROCEDURES  Procedure(s) performed:  Procedure Name: Intubation Date/Time: 07/30/2019 11:45 PM Performed by: Merlyn Lot, MD Pre-anesthesia Checklist: Patient identified, Emergency Drugs available, Suction available and Patient being monitored Preoxygenation: Pre-oxygenation with 100% oxygen Induction Type: IV induction and Rapid sequence Laryngoscope Size: Glidescope and 3 Grade View: Grade II Tube size: 7.5 mm Number of attempts: 1 Airway Equipment and Method: Video-laryngoscopy Placement Confirmation: ETT inserted through vocal cords under direct vision,  CO2 detector and Breath sounds checked- equal and bilateral Dental Injury: Teeth and Oropharynx as per pre-operative assessment          Critical Care performed: no ____________________________________________   INITIAL IMPRESSION / ASSESSMENT AND PLAN / ED COURSE  Pertinent labs & imaging results that were available during my care of the patient were reviewed by me and considered in my medical decision making (see chart for details).   DDX: dysrhythmia aspiration, pna, hypoxia, electrolyte abn,  hypovolemia, sah, sdh, iph  Philip Moreno  is a 78 y.o. who presents to the ED with history of Alzheimer's as well as cardiac disease and reported seizure history presents in cardiac arrest after witnessed seizure episode and reported aspiration event.  ACLS protocol was followed patient given multiple rounds of epinephrine sodium bicarb as well as calcium.  Patient was intubated for advanced airway which did show probably significant amount of aspiration.  After multiple rounds of ACLS patient persisted in PEA.  Bedside cardiac ultrasound showed no contractility.  Given the patient was reportedly in PEA arrest for over 50minutes resuscitative efforts were terminated.  Family informed.  Chaplain notified and providing support to family.  Case discussed with ME on call who has released patient.     The patient was evaluated in Emergency Department today for the symptoms described in the history of present illness. He/she was evaluated in the context of the global COVID-19 pandemic, which necessitated consideration that the patient might be at risk for infection with the SARS-CoV-2 virus that causes COVID-19. Institutional protocols and algorithms that pertain to the evaluation of patients at risk for COVID-19 are in a state of rapid change based on information released by regulatory bodies including the CDC and federal and state organizations. These policies and algorithms were followed during the patient's care in the ED.  As part of my medical decision making, I reviewed the following data within the [place] notes reviewed and incorporated, Labs reviewed, notes from prior ED visits and [place] Controlled Substance Database   ____________________________________________   FINAL CLINICAL IMPRESSION(S) / ED DIAGNOSES  Final diagnoses:  Seizure-like activity ([place])  Cardiac arrest ([place])      NEW MEDICATIONS STARTED DURING THIS VISIT:  New Prescriptions   No medications on  file     Note:  This document was prepared using Dragon voice recognition software and may include unintentional dictation errors.    Merlyn Lot, MD 07/21/2019 [phone]

## 2019-07-29 ENCOUNTER — Encounter: Payer: Self-pay | Admitting: Pulmonary Disease

## 2019-07-29 MED FILL — Medication: Qty: 1 | Status: AC

## 2019-07-31 ENCOUNTER — Telehealth: Payer: Self-pay

## 2019-07-31 ENCOUNTER — Ambulatory Visit: Payer: Medicare Other | Admitting: Family Medicine

## 2019-08-04 NOTE — ED Notes (Signed)
Pt arrived CPR in progress with [facility], LKW 2200, per EMS hx of seizures and per "rescue report pt was normal when they arrived, then he vomited and I believe he may have aspirated, cardiac arrest followed, we started CPR at 2241 and easy bagging" -- "PEA the whole way here and we gave 3 rounds of epi"; CBG 168  Pt unresponsive and mottled upon arrival - IO in left tibia  2307 EPI given 2309 IV R AC - blood collected and sent with "Doe" sticker 2310 EPI - PEA 2311 BiCarb 2313 EPI 2314 NS bolus, and Calcium  2316 PEA, EDP Quentin Cornwall reports no cardiac activity  2317 time of death called

## 2019-08-04 NOTE — ED Notes (Signed)
Family at bedside. - wife and other  Remer Macho preferences obtained: [place]  Pt has no personal belonging in room and no jewelry on pt

## 2019-08-04 NOTE — Progress Notes (Signed)
Ch responded to a pg regarding the pt in ED04. CPR was preformed on the pt that has suffered from several seizures and has a hx of several strokes. Ch was present to be with the wife and the brother of the pt who had expired. Ch provided words of comfort, prayer and compassionate presence for the family at bedside of the deceased. Ch escorted family out to the lobby and expressed condolences for their loss. Ch spiritual support was appreciated.    2019-08-18 0000  Clinical Encounter Type  Visited With Patient and family together;Health care provider  Visit Type Death;ED;Spiritual support  Referral From Nurse  Consult/Referral To Chaplain  Spiritual Encounters  Spiritual Needs Prayer;Emotional;Grief support  Stress Factors  Patient Stress Factors Exhausted;Health changes  Family Stress Factors Exhausted;Loss

## 2019-08-04 DEATH — deceased

## 2019-08-14 ENCOUNTER — Telehealth: Payer: Self-pay

## 2019-08-28 ENCOUNTER — Other Ambulatory Visit: Payer: Medicare Other | Admitting: Primary Care

## 2019-09-07 ENCOUNTER — Telehealth: Payer: Self-pay

## 2020-07-09 ENCOUNTER — Ambulatory Visit: Payer: Medicare Other

## 2021-03-17 IMAGING — MR MR HEAD W/O CM
10 series · 48 of 48 positions shown · non-contrast
Comparison: CT head without contrast 07/20/2019. MR head without
and with contrast 12/20/2015

CLINICAL DATA: Hypertension.  Acute onset of confusion.  Epistaxis.

EXAM:
MRI HEAD WITHOUT CONTRAST
TECHNIQUE: Multiplanar, multiecho pulse sequences of the brain and surrounding
structures were obtained without intravenous contrast.

[Series 3: ax dwi_tracew · axial · 3.0mm · 1.31mm/px · z∈[-89,+66]mm · 8 of 48 slices shown]
[im 1/48]
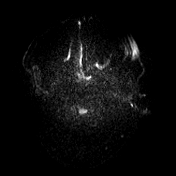
[im 7/48]
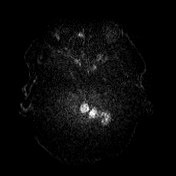
[im 14/48]
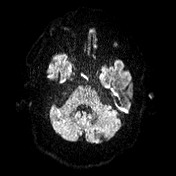
[im 21/48]
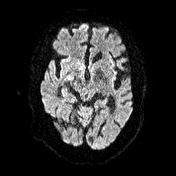
[im 27/48]
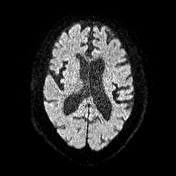
[im 34/48]
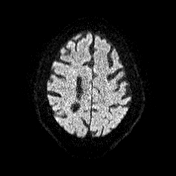
[im 41/48]
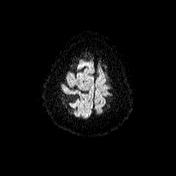
[im 48/48]
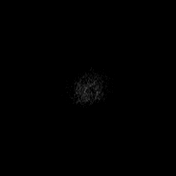

[Series 4: ax dwi_adc · axial · 3.0mm · 1.31mm/px · z∈[-89,+66]mm · 7 of 48 slices shown]
[im 1/48]
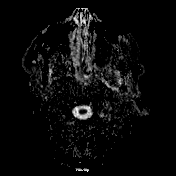
[im 8/48]
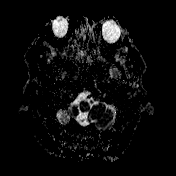
[im 16/48]
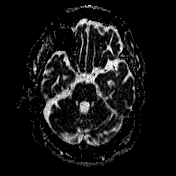
[im 24/48]
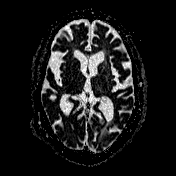
[im 32/48]
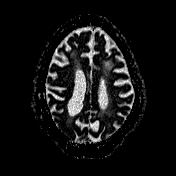
[im 40/48]
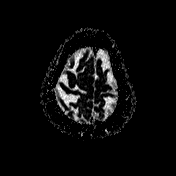
[im 48/48]
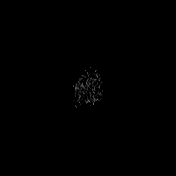

[Series 5: cor dwi_tracew · coronal · 5.0mm · 1.31mm/px · 5 of 38 slices shown]
[im 1/38]
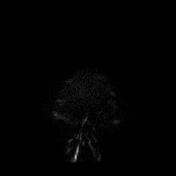
[im 10/38]
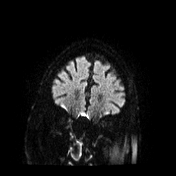
[im 19/38]
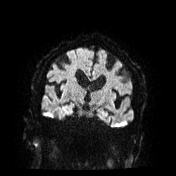
[im 28/38]
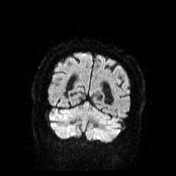
[im 38/38]
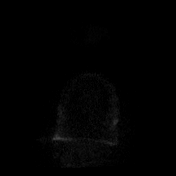

[Series 6: cor dwi_adc · coronal · 5.0mm · 1.31mm/px · 5 of 38 slices shown]
[im 1/38]
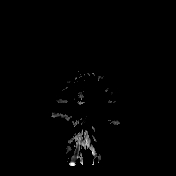
[im 10/38]
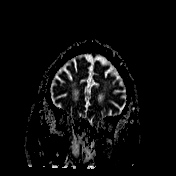
[im 19/38]
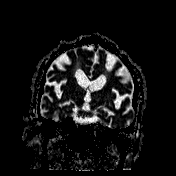
[im 28/38]
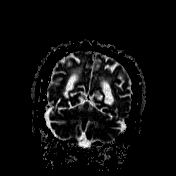
[im 38/38]
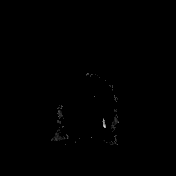

[Series 7: T1 · sagittal · 5.0mm · 0.94mm/px · 3 of 23 slices shown (1 of 2)]
[im 1/23]
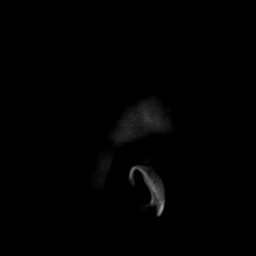
[im 12/23]
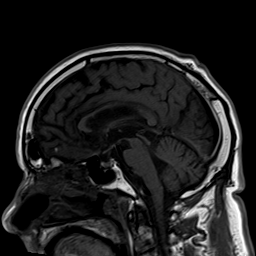
[im 23/23]
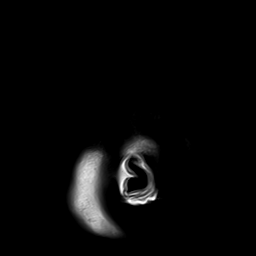

[Series 8: T2 · axial · 5.0mm · 0.45mm/px · z∈[-100,+56]mm · 4 of 27 slices shown (1 of 2)]
[im 1/27]
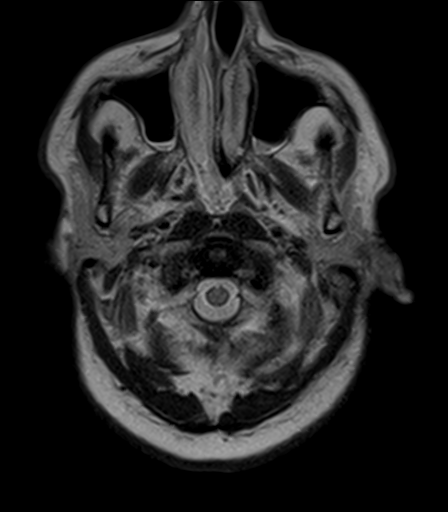
[im 9/27]
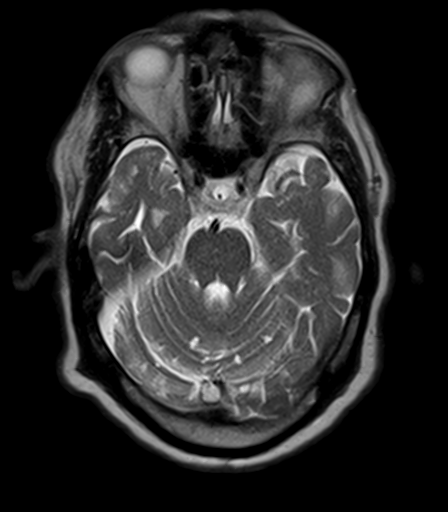
[im 18/27]
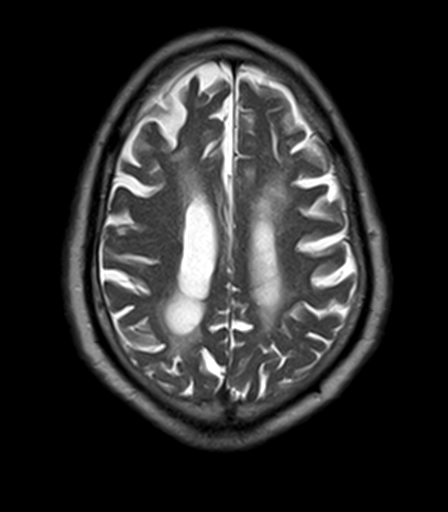
[im 27/27]
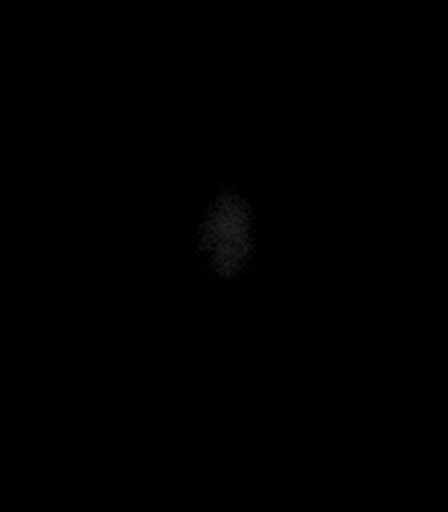

[Series 9: T2-star · axial · 5.0mm · 0.45mm/px · z∈[-100,+56]mm · 4 of 27 slices shown]
[im 1/27]
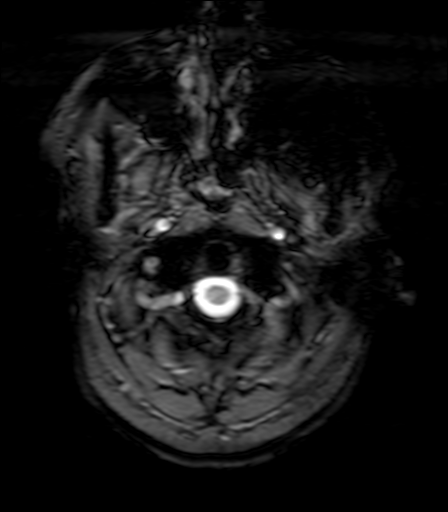
[im 9/27]
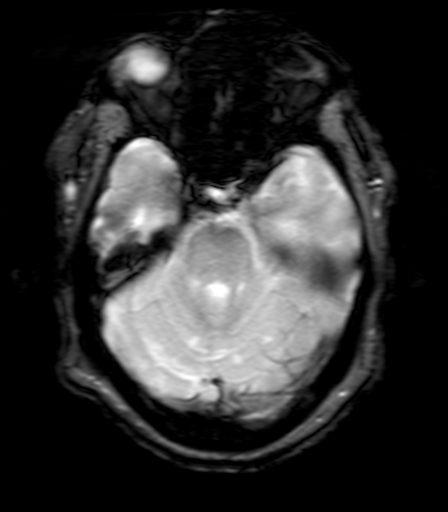
[im 18/27]
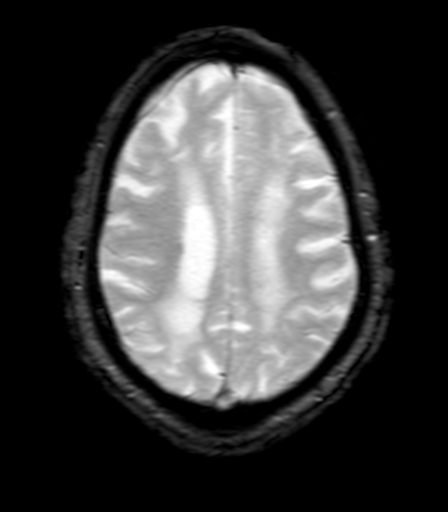
[im 27/27]
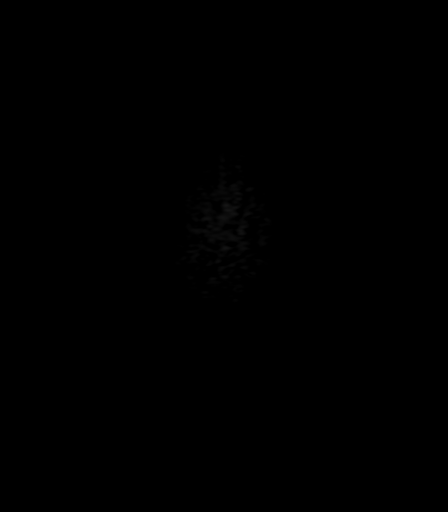

[Series 10: FLAIR · axial · 5.0mm · 1.20mm/px · z∈[-99,+57]mm · 4 of 27 slices shown]
[im 1/27]
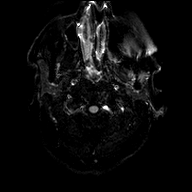
[im 9/27]
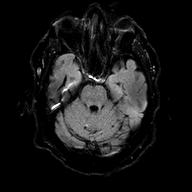
[im 18/27]
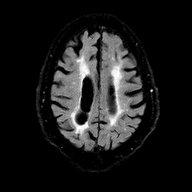
[im 27/27]
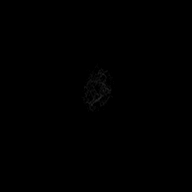

[Series 11: T1 · axial · 5.0mm · 0.90mm/px · z∈[-100,+56]mm · 4 of 27 slices shown (2 of 2)]
[im 1/27]
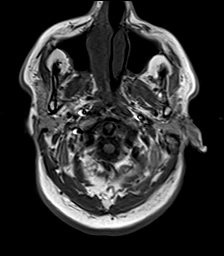
[im 9/27]
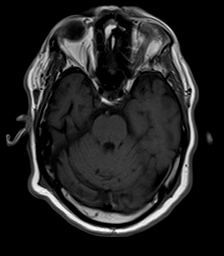
[im 18/27]
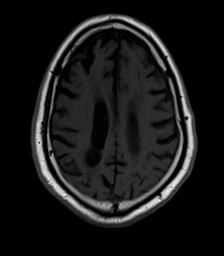
[im 27/27]
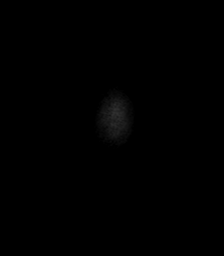

[Series 12: T2 · coronal · 5.0mm · 0.45mm/px · 4 of 31 slices shown (2 of 2)]
[im 1/31]
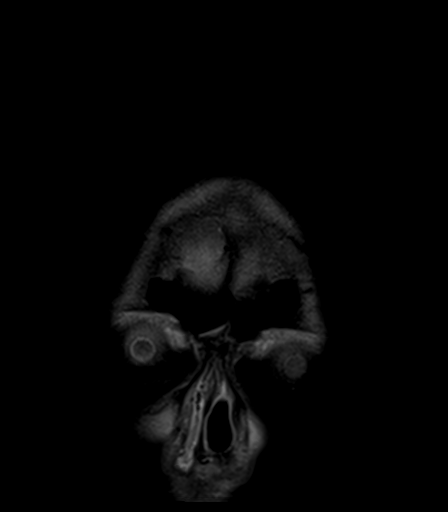
[im 11/31]
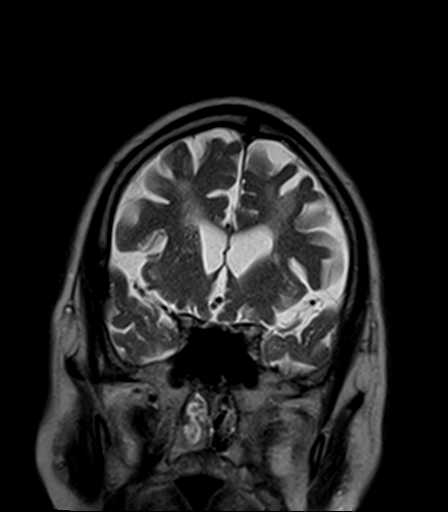
[im 21/31]
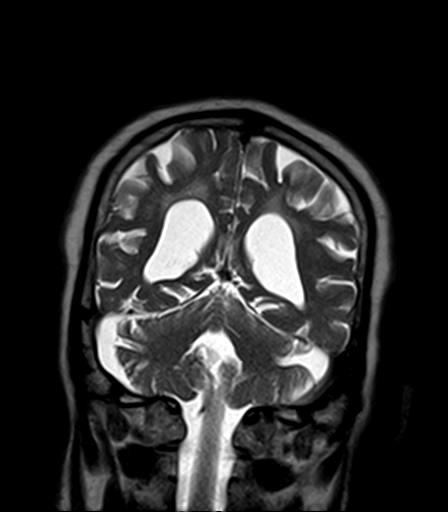
[im 31/31]
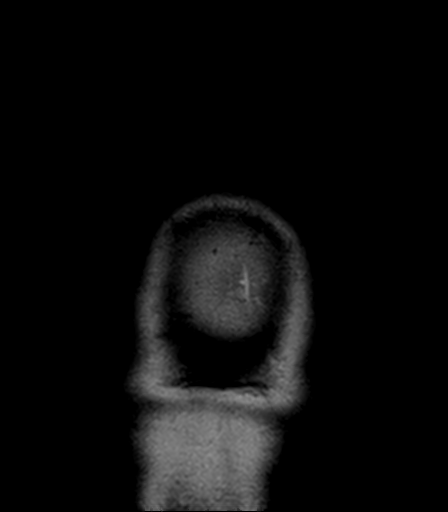

[48 of 48 positions shown; findings below may reference images not displayed]

FINDINGS: Brain: Diffusion-weighted images demonstrate no acute or subacute
infarction. Multiple remote lacunar infarcts are present throughout
the cerebellum bilaterally. A remote nonhemorrhagic lacunar infarct
is present in the left thalamus. Remote ischemic changes are present
in the right thalamus.

Confluent periventricular T2 hyperintensities are present
bilaterally. There is thinning of the corpus callosum. Marked
atrophy is noted. Remote cortical infarct is present in the anterior
frontal lobes bilaterally.

The ventricles are proportionate to the degree of atrophy. No
significant extraaxial fluid collection is present.

Vascular: Flow is present in the major intracranial arteries.

Skull and upper cervical spine: The craniocervical junction is
normal. Upper cervical spine is within normal limits. Marrow signal
is unremarkable.

Sinuses/Orbits: Small fluid levels are present in the maxillary
sinuses bilaterally. There is associated T1 shortening, likely
hemorrhage. Additional nonhemorrhagic fluid is present in the
nasopharynx. Mastoid air cells are clear. Remaining paranasal
sinuses are clear. The globes and orbits are within normal limits.
IMPRESSION: 1. No acute intracranial abnormality.
2. Multiple remote lacunar infarcts, predominantly in the posterior
circulation.
3. Advanced atrophy and diffuse white matter disease. This likely
reflects the sequela of chronic microvascular ischemia.
4. Fluid levels in the maxillary sinuses bilaterally compatible with
hemorrhage.
5. Asymmetric congestion of the right nasal cavity and additional
nasopharyngeal fluid, less likely blood.
